# Patient Record
Sex: Male | Born: 1945
Health system: Southern US, Community
[De-identification: ages and names within clinical notes are randomized; demographics above are authoritative.]

## PROBLEM LIST (undated history)

## (undated) DIAGNOSIS — E785 Hyperlipidemia, unspecified: Secondary | ICD-10-CM

## (undated) DIAGNOSIS — C61 Malignant neoplasm of prostate: Secondary | ICD-10-CM

## (undated) DIAGNOSIS — C801 Malignant (primary) neoplasm, unspecified: Secondary | ICD-10-CM

## (undated) DIAGNOSIS — T7840XA Allergy, unspecified, initial encounter: Secondary | ICD-10-CM

## (undated) DIAGNOSIS — N200 Calculus of kidney: Secondary | ICD-10-CM

## (undated) DIAGNOSIS — I10 Essential (primary) hypertension: Secondary | ICD-10-CM

## (undated) HISTORY — DX: Hyperlipidemia, unspecified: E78.5

## (undated) HISTORY — DX: Malignant neoplasm of prostate: C61

## (undated) HISTORY — DX: Malignant (primary) neoplasm, unspecified: C80.1

## (undated) HISTORY — DX: Essential (primary) hypertension: I10

## (undated) HISTORY — DX: Calculus of kidney: N20.0

## (undated) HISTORY — DX: Allergy, unspecified, initial encounter: T78.40XA

---

## 2003-01-22 DIAGNOSIS — C801 Malignant (primary) neoplasm, unspecified: Secondary | ICD-10-CM

## 2003-01-22 HISTORY — DX: Malignant (primary) neoplasm, unspecified: C80.1

## 2003-03-16 ENCOUNTER — Ambulatory Visit (HOSPITAL_COMMUNITY): Admission: RE | Admit: 2003-03-16 | Discharge: 2003-03-16 | Payer: Self-pay | Admitting: Urology

## 2003-04-01 ENCOUNTER — Ambulatory Visit: Admission: RE | Admit: 2003-04-01 | Discharge: 2003-05-10 | Payer: Self-pay | Admitting: Radiation Oncology

## 2003-06-22 HISTORY — PX: PROSTATECTOMY: SHX69

## 2004-10-03 ENCOUNTER — Encounter (HOSPITAL_COMMUNITY): Admission: RE | Admit: 2004-10-03 | Discharge: 2004-10-18 | Payer: Self-pay | Admitting: Urology

## 2004-11-06 ENCOUNTER — Ambulatory Visit: Admission: RE | Admit: 2004-11-06 | Discharge: 2005-01-23 | Payer: Self-pay | Admitting: Radiation Oncology

## 2005-06-06 ENCOUNTER — Ambulatory Visit: Payer: Self-pay | Admitting: Internal Medicine

## 2005-06-21 ENCOUNTER — Ambulatory Visit: Payer: Self-pay | Admitting: Internal Medicine

## 2005-06-24 ENCOUNTER — Ambulatory Visit: Payer: Self-pay | Admitting: Family Medicine

## 2005-11-28 ENCOUNTER — Ambulatory Visit (HOSPITAL_COMMUNITY): Admission: RE | Admit: 2005-11-28 | Discharge: 2005-11-28 | Payer: Self-pay | Admitting: Urology

## 2006-03-10 ENCOUNTER — Ambulatory Visit (HOSPITAL_COMMUNITY): Admission: RE | Admit: 2006-03-10 | Discharge: 2006-03-10 | Payer: Self-pay | Admitting: Urology

## 2006-03-27 ENCOUNTER — Encounter (HOSPITAL_COMMUNITY): Admission: RE | Admit: 2006-03-27 | Discharge: 2006-05-22 | Payer: Self-pay | Admitting: Urology

## 2006-07-23 ENCOUNTER — Ambulatory Visit: Payer: Self-pay | Admitting: Family Medicine

## 2006-08-29 ENCOUNTER — Ambulatory Visit (HOSPITAL_COMMUNITY): Admission: RE | Admit: 2006-08-29 | Discharge: 2006-08-29 | Payer: Self-pay | Admitting: Urology

## 2007-09-11 ENCOUNTER — Ambulatory Visit: Payer: Self-pay | Admitting: Family Medicine

## 2008-03-24 ENCOUNTER — Ambulatory Visit (HOSPITAL_COMMUNITY): Admission: RE | Admit: 2008-03-24 | Discharge: 2008-03-24 | Payer: Self-pay | Admitting: Urology

## 2008-05-27 ENCOUNTER — Ambulatory Visit: Payer: Self-pay | Admitting: Family Medicine

## 2008-07-07 ENCOUNTER — Ambulatory Visit (HOSPITAL_COMMUNITY): Admission: RE | Admit: 2008-07-07 | Discharge: 2008-07-07 | Payer: Self-pay | Admitting: Urology

## 2008-11-15 ENCOUNTER — Ambulatory Visit: Payer: Self-pay | Admitting: Family Medicine

## 2009-08-28 ENCOUNTER — Ambulatory Visit (HOSPITAL_COMMUNITY): Admission: RE | Admit: 2009-08-28 | Discharge: 2009-08-28 | Payer: Self-pay | Admitting: Urology

## 2009-11-30 ENCOUNTER — Ambulatory Visit: Payer: Self-pay | Admitting: Family Medicine

## 2010-05-30 ENCOUNTER — Ambulatory Visit: Payer: Self-pay | Admitting: Medical

## 2010-11-29 ENCOUNTER — Encounter: Payer: Self-pay | Admitting: Family Medicine

## 2010-11-30 ENCOUNTER — Encounter: Payer: Self-pay | Admitting: Family Medicine

## 2010-11-30 ENCOUNTER — Ambulatory Visit (INDEPENDENT_AMBULATORY_CARE_PROVIDER_SITE_OTHER): Payer: Medicare Other | Admitting: Family Medicine

## 2010-11-30 VITALS — BP 122/82 | HR 64 | Ht 73.0 in | Wt 214.0 lb

## 2010-11-30 DIAGNOSIS — C61 Malignant neoplasm of prostate: Secondary | ICD-10-CM

## 2010-11-30 DIAGNOSIS — Z8709 Personal history of other diseases of the respiratory system: Secondary | ICD-10-CM

## 2010-11-30 DIAGNOSIS — Z23 Encounter for immunization: Secondary | ICD-10-CM

## 2010-11-30 DIAGNOSIS — J309 Allergic rhinitis, unspecified: Secondary | ICD-10-CM

## 2010-11-30 DIAGNOSIS — I10 Essential (primary) hypertension: Secondary | ICD-10-CM | POA: Insufficient documentation

## 2010-11-30 DIAGNOSIS — E785 Hyperlipidemia, unspecified: Secondary | ICD-10-CM

## 2010-11-30 DIAGNOSIS — Z79899 Other long term (current) drug therapy: Secondary | ICD-10-CM

## 2010-11-30 LAB — COMPREHENSIVE METABOLIC PANEL
ALT: 30 U/L (ref 0–53)
AST: 23 U/L (ref 0–37)
CO2: 26 mEq/L (ref 19–32)
Chloride: 103 mEq/L (ref 96–112)
Sodium: 141 mEq/L (ref 135–145)
Total Bilirubin: 0.3 mg/dL (ref 0.3–1.2)
Total Protein: 7.3 g/dL (ref 6.0–8.3)

## 2010-11-30 LAB — CBC WITH DIFFERENTIAL/PLATELET
Basophils Absolute: 0 10*3/uL (ref 0.0–0.1)
Eosinophils Absolute: 0.2 10*3/uL (ref 0.0–0.7)
Lymphocytes Relative: 16 % (ref 12–46)
Lymphs Abs: 1.2 10*3/uL (ref 0.7–4.0)
Neutrophils Relative %: 72 % (ref 43–77)
Platelets: 321 10*3/uL (ref 150–400)
RBC: 4.07 MIL/uL — ABNORMAL LOW (ref 4.22–5.81)
RDW: 14.8 % (ref 11.5–15.5)
WBC: 7.6 10*3/uL (ref 4.0–10.5)

## 2010-11-30 LAB — LIPID PANEL
Cholesterol: 233 mg/dL — ABNORMAL HIGH (ref 0–200)
LDL Cholesterol: 149 mg/dL — ABNORMAL HIGH (ref 0–99)
Total CHOL/HDL Ratio: 5.8 Ratio
VLDL: 44 mg/dL — ABNORMAL HIGH (ref 0–40)

## 2010-11-30 MED ORDER — LISINOPRIL-HYDROCHLOROTHIAZIDE 20-25 MG PO TABS: 1.0000 | ORAL_TABLET | Freq: Every day | ORAL | Status: DC

## 2010-11-30 NOTE — Patient Instructions (Signed)
Continue to take good care of yourself 

## 2010-11-30 NOTE — Progress Notes (Signed)
  Subjective:    Patient ID: Marcus [name], male    DOB: August 11, 1945, 65 y.o.   MRN: [id number]  HPI He is here for an interval evaluation. He continues to be followed by his urologist for prostate cancer. He is apparently getting an injection of the none known medication to help with apparent continued elevated PSA. She is on his blood pressure medication. He does have a previous history of elevated cholesterol but is not on a medication. His allergies and asthma are not bothering him. He is now retired. His marriage is going well. He does keep himself quite busy around the house and with travel. Family history and social history is unchanged except as above   Review of Systems  Constitutional: Negative.   HENT: Negative.   Eyes: Negative.   Respiratory: Negative.   Cardiovascular: Negative.   Musculoskeletal: Negative.   Neurological: Negative.   Hematological: Negative.   Psychiatric/Behavioral: Negative.        Objective:   Physical Exam BP 122/82  Pulse 64  Ht 6\' 1"  (1.854 m)  Wt 214 lb (97.07 kg)  BMI 28.23 kg/m2  General Appearance:    Alert, cooperative, no distress, appears stated age  Head:    Normocephalic, without obvious abnormality, atraumatic  Eyes:    PERRL, conjunctiva/corneas clear, EOM's intact, fundi    benign  Ears:    Normal TM's and external ear canals  Nose:   Nares normal, mucosa normal, no drainage or sinus   tenderness  Throat:   Lips, mucosa, and tongue normal; teeth and gums normal  Neck:   Supple, no lymphadenopathy;  thyroid:  no   enlargement/tenderness/nodules; no carotid   bruit or JVD  Back:    Spine nontender, no curvature, ROM normal, no CVA     tenderness  Lungs:     Clear to auscultation bilaterally without wheezes, rales or     ronchi; respirations unlabored  Chest Wall:    No tenderness or deformity   Heart:    Regular rate and rhythm, S1 and S2 normal, no murmur, rub   or gallop  Breast Exam:    No chest wall tenderness, masses or  gynecomastia  Abdomen:     Soft, non-tender, nondistended, normoactive bowel sounds,    no masses, no hepatosplenomegaly  Genitalia:   deferred  Rectal:   deferred   Extremities:   No clubbing, cyanosis or edema  Pulses:   2+ and symmetric all extremities  Skin:   Skin color, texture, turgor normal, no rashes or lesions  Lymph nodes:   Cervical, supraclavicular, and axillary nodes normal  Neurologic:   CNII-XII intact, normal strength, sensation and gait; reflexes 2+ and symmetric throughout          Psych:   Normal mood, affect, hygiene and grooming.           Assessment & Plan:   1. Prostate cancer  CBC with Differential, Comprehensive metabolic panel, Lipid panel  2. Hypertension  CBC with Differential, Comprehensive metabolic panel, Lipid panel  3. Hyperlipidemia LDL goal < 100  Lipid panel  4. Allergic rhinitis, mild    5. History of asthma    6. Encounter for long-term (current) use of other medications  CBC with Differential, Comprehensive metabolic panel, Lipid panel   encouraged him to keep his very active lifestyle as well as with his traveling. The lisinopril was renewed.

## 2011-01-15 ENCOUNTER — Other Ambulatory Visit: Payer: Self-pay | Admitting: Family Medicine

## 2011-03-14 ENCOUNTER — Other Ambulatory Visit (HOSPITAL_COMMUNITY): Payer: Self-pay | Admitting: Urology

## 2011-03-14 DIAGNOSIS — C61 Malignant neoplasm of prostate: Secondary | ICD-10-CM

## 2011-03-19 ENCOUNTER — Ambulatory Visit (HOSPITAL_COMMUNITY): Payer: Medicare Other

## 2011-03-19 ENCOUNTER — Encounter (HOSPITAL_COMMUNITY): Payer: Medicare Other

## 2011-03-25 ENCOUNTER — Encounter (HOSPITAL_COMMUNITY): Payer: Self-pay

## 2011-03-25 ENCOUNTER — Encounter (HOSPITAL_COMMUNITY)
Admission: RE | Admit: 2011-03-25 | Discharge: 2011-03-25 | Disposition: A | Discharge status: Home / Self Care | Payer: Medicare Other | Source: Ambulatory Visit | Attending: Urology | Admitting: Urology

## 2011-03-25 DIAGNOSIS — C61 Malignant neoplasm of prostate: Secondary | ICD-10-CM | POA: Insufficient documentation

## 2011-03-25 MED ORDER — TECHNETIUM TC 99M MEDRONATE IV KIT
27.0000 | PACK | Freq: Once | INTRAVENOUS | Status: AC | PRN
  Administered 2011-03-25: 27 via INTRAVENOUS

## 2011-10-10 ENCOUNTER — Encounter: Payer: Self-pay | Admitting: Family Medicine

## 2011-10-10 ENCOUNTER — Ambulatory Visit (INDEPENDENT_AMBULATORY_CARE_PROVIDER_SITE_OTHER): Payer: Medicare Other | Admitting: Family Medicine

## 2011-10-10 VITALS — BP 120/82 | HR 72 | Wt 223.0 lb

## 2011-10-10 DIAGNOSIS — Z23 Encounter for immunization: Secondary | ICD-10-CM

## 2011-10-10 DIAGNOSIS — M25562 Pain in left knee: Secondary | ICD-10-CM

## 2011-10-10 DIAGNOSIS — M25569 Pain in unspecified knee: Secondary | ICD-10-CM

## 2011-10-10 MED ORDER — INFLUENZA VIRUS VACC SPLIT PF IM SUSP: 0.5000 mL | Freq: Once | INTRAMUSCULAR | Status: DC

## 2011-10-10 NOTE — Progress Notes (Signed)
  Subjective:    Patient ID: Marcus [name], male    DOB: [birth year], 66 y.o.   MRN: [id number]  HPI He has a two-week history of left posterior knee pain. No history of injury or overuse. Previous history of injury. He states the pain now goes proximal and he can get in certain positions that relieve the discomfort. Motion of the knee does not cause trouble. Advil does help   Review of Systems    Objective:   Physical Exam Full motion of the hip no pain. No N. changes are noted. No anal palpation of the quad mechanism or hamstrings. Knee exam shows no tenderness to palpation, effusion with normal Murray's and anterior drawer.        Assessment & Plan:   1. Need for prophylactic vaccination and inoculation against influenza  influenza  inactive virus vaccine (FLUZONE/FLUARIX) injection 0.5 mL  2. Left knee pain     flu shot given with discussion of risk and benefit. Take regular doses of Advil. 4 tablets 3 times per day for the next 7-10 days. Keep track of your symptoms in regard to anything that makes it better or worse.

## 2011-10-10 NOTE — Patient Instructions (Addendum)
Take regular doses of Advil. 4 tablets 3 times per day for the next 7-10 days. Keep track of your symptoms in regard to anything that makes it better or worse.

## 2011-12-10 ENCOUNTER — Other Ambulatory Visit (HOSPITAL_COMMUNITY): Payer: Self-pay | Admitting: Urology

## 2011-12-10 DIAGNOSIS — C61 Malignant neoplasm of prostate: Secondary | ICD-10-CM

## 2011-12-13 ENCOUNTER — Encounter (HOSPITAL_COMMUNITY)
Admission: RE | Admit: 2011-12-13 | Discharge: 2011-12-13 | Disposition: A | Discharge status: Home / Self Care | Payer: Medicare Other | Source: Ambulatory Visit | Attending: Urology | Admitting: Urology

## 2011-12-13 DIAGNOSIS — R972 Elevated prostate specific antigen [PSA]: Secondary | ICD-10-CM | POA: Insufficient documentation

## 2011-12-13 DIAGNOSIS — C61 Malignant neoplasm of prostate: Secondary | ICD-10-CM | POA: Insufficient documentation

## 2011-12-13 MED ORDER — TECHNETIUM TC 99M MEDRONATE IV KIT
20.5000 | PACK | Freq: Once | INTRAVENOUS | Status: AC | PRN
  Administered 2011-12-13: 20.5 via INTRAVENOUS

## 2012-02-10 ENCOUNTER — Other Ambulatory Visit: Payer: Self-pay | Admitting: Family Medicine

## 2012-03-25 ENCOUNTER — Encounter: Payer: Self-pay | Admitting: Family Medicine

## 2012-05-25 ENCOUNTER — Ambulatory Visit (INDEPENDENT_AMBULATORY_CARE_PROVIDER_SITE_OTHER): Payer: Medicare Other | Admitting: Family Medicine

## 2012-05-25 ENCOUNTER — Encounter: Payer: Self-pay | Admitting: Family Medicine

## 2012-05-25 VITALS — BP 128/88 | HR 64 | Ht 73.0 in | Wt 221.0 lb

## 2012-05-25 DIAGNOSIS — C61 Malignant neoplasm of prostate: Secondary | ICD-10-CM

## 2012-05-25 DIAGNOSIS — Z8709 Personal history of other diseases of the respiratory system: Secondary | ICD-10-CM

## 2012-05-25 DIAGNOSIS — Z0000 Encounter for general adult medical examination without abnormal findings: Secondary | ICD-10-CM

## 2012-05-25 DIAGNOSIS — E785 Hyperlipidemia, unspecified: Secondary | ICD-10-CM

## 2012-05-25 DIAGNOSIS — J309 Allergic rhinitis, unspecified: Secondary | ICD-10-CM

## 2012-05-25 DIAGNOSIS — K219 Gastro-esophageal reflux disease without esophagitis: Secondary | ICD-10-CM

## 2012-05-25 DIAGNOSIS — I10 Essential (primary) hypertension: Secondary | ICD-10-CM

## 2012-05-25 LAB — COMPREHENSIVE METABOLIC PANEL
ALT: 37 U/L (ref 0–53)
AST: 31 U/L (ref 0–37)
CO2: 27 mEq/L (ref 19–32)
Calcium: 11 mg/dL — ABNORMAL HIGH (ref 8.4–10.5)
Chloride: 104 mEq/L (ref 96–112)
Creat: 1.37 mg/dL — ABNORMAL HIGH (ref 0.50–1.35)
Sodium: 139 mEq/L (ref 135–145)
Total Protein: 7.3 g/dL (ref 6.0–8.3)

## 2012-05-25 LAB — CBC WITH DIFFERENTIAL/PLATELET
Basophils Absolute: 0 10*3/uL (ref 0.0–0.1)
Eosinophils Relative: 3 % (ref 0–5)
Lymphocytes Relative: 20 % (ref 12–46)
Lymphs Abs: 1.6 10*3/uL (ref 0.7–4.0)
Neutro Abs: 5.8 10*3/uL (ref 1.7–7.7)
Neutrophils Relative %: 72 % (ref 43–77)
Platelets: 292 10*3/uL (ref 150–400)
RBC: 4.39 MIL/uL (ref 4.22–5.81)
RDW: 15.4 % (ref 11.5–15.5)
WBC: 8.1 10*3/uL (ref 4.0–10.5)

## 2012-05-25 LAB — LIPID PANEL
Cholesterol: 254 mg/dL — ABNORMAL HIGH (ref 0–200)
Total CHOL/HDL Ratio: 5.8 Ratio
VLDL: 39 mg/dL (ref 0–40)

## 2012-05-25 LAB — POCT URINALYSIS DIPSTICK
Glucose, UA: NEGATIVE
Leukocytes, UA: NEGATIVE
Nitrite, UA: NEGATIVE
Spec Grav, UA: 1.015
Urobilinogen, UA: NEGATIVE

## 2012-05-25 MED ORDER — LISINOPRIL-HYDROCHLOROTHIAZIDE 20-25 MG PO TABS: ORAL_TABLET | ORAL | Status: DC

## 2012-05-25 NOTE — Progress Notes (Signed)
  Subjective:    Patient ID: Marcus [name], male    DOB: 24-Oct-1945, 67 y.o.   MRN: [id number]  HPI He is here for complete examination. He does have a history of prostate cancer and is being followed by urology regularly. He presently is on medication for this. He has had previous prostatectomy.he does have a history of seasonal allergies as well as asthma however he has not had any asthma operation several years. He continues on his blood pressure medication and is having no difficulty with that. He does have difficulty with reflux disease and finds the Zantac to be quite useful. Family and social history were reviewed. Life in general is going quite well. He is retired and his home life is quite good   Review of Systems  Constitutional: Negative.   HENT: Negative.   Eyes: Negative.   Respiratory: Negative.   Cardiovascular: Negative.   Gastrointestinal: Negative.   Endocrine: Negative.   Genitourinary: Negative.   Musculoskeletal: Negative.   Skin: Negative.        Objective:   Physical Exam BP 128/88  Pulse 64  Ht 6\' 1"  (1.854 m)  Wt 221 lb (100.245 kg)  BMI 29.16 kg/m2  General Appearance:    Alert, cooperative, no distress, appears stated age  Head:    Normocephalic, without obvious abnormality, atraumatic  Eyes:    PERRL, conjunctiva/corneas clear, EOM's intact, fundi    benign  Ears:    Normal TM's and external ear canals  Nose:   Nares normal, mucosa normal, no drainage or sinus   tenderness  Throat:   Lips, mucosa, and tongue normal; teeth and gums normal  Neck:   Supple, no lymphadenopathy;  thyroid:  no   enlargement/tenderness/nodules; no carotid   bruit or JVD  Back:    Spine nontender, no curvature, ROM normal, no CVA     tenderness  Lungs:     Clear to auscultation bilaterally without wheezes, rales or     ronchi; respirations unlabored  Chest Wall:    No tenderness or deformity   Heart:    Regular rate and rhythm, S1 and S2 normal, no murmur, rub   or gallop   Breast Exam:    No chest wall tenderness, masses or gynecomastia  Abdomen:     Soft, non-tender, nondistended, normoactive bowel sounds,    no masses, no hepatosplenomegaly  Genitalia:  deferred  Rectal:  deferred  Extremities:   No clubbing, cyanosis or edema  Pulses:   2+ and symmetric all extremities  Skin:   Skin color, texture, turgor normal, no rashes or lesions  Lymph nodes:   Cervical, supraclavicular, and axillary nodes normal  Neurologic:   CNII-XII intact, normal strength, sensation and gait; reflexes 2+ and symmetric throughout          Psych:   Normal mood, affect, hygiene and grooming.          Assessment & Plan:  Routine general medical examination at a health care facility - Plan: Urinalysis Dipstick, CBC with Differential, Comprehensive metabolic panel, Lipid panel  Prostate cancer  Hypertension - Plan: lisinopril-hydrochlorothiazide (PRINZIDE,ZESTORETIC) 20-25 MG per tablet  Hyperlipidemia LDL goal < 100  Allergic rhinitis, mild  History of asthma  GERD (gastroesophageal reflux disease) continue on Zantac. Continues to take good care of himself. Prinzide was renewed.

## 2012-08-24 ENCOUNTER — Telehealth: Payer: Self-pay | Admitting: Oncology

## 2012-08-24 NOTE — Telephone Encounter (Signed)
S/W PT IN RE TO NP APPT 08/15 @ 10:30 W/DR. SHADAD REFERRING DR. Brunilda Payor DX- PROSTATE CA WELCOME PACKET MAILED.

## 2012-08-25 ENCOUNTER — Telehealth: Payer: Self-pay | Admitting: Oncology

## 2012-08-25 NOTE — Telephone Encounter (Signed)
C/D 08/25/12 for appt. 09/04/12

## 2012-08-28 ENCOUNTER — Other Ambulatory Visit: Payer: Self-pay | Admitting: Oncology

## 2012-08-28 DIAGNOSIS — C61 Malignant neoplasm of prostate: Secondary | ICD-10-CM

## 2012-09-03 ENCOUNTER — Other Ambulatory Visit (HOSPITAL_COMMUNITY): Payer: Self-pay | Admitting: Urology

## 2012-09-03 DIAGNOSIS — C61 Malignant neoplasm of prostate: Secondary | ICD-10-CM

## 2012-09-04 ENCOUNTER — Ambulatory Visit: Payer: Medicare Other

## 2012-09-04 ENCOUNTER — Ambulatory Visit (HOSPITAL_BASED_OUTPATIENT_CLINIC_OR_DEPARTMENT_OTHER): Payer: Medicare Other | Admitting: Oncology

## 2012-09-04 ENCOUNTER — Encounter: Payer: Self-pay | Admitting: Oncology

## 2012-09-04 ENCOUNTER — Telehealth: Payer: Self-pay | Admitting: Oncology

## 2012-09-04 ENCOUNTER — Other Ambulatory Visit (HOSPITAL_BASED_OUTPATIENT_CLINIC_OR_DEPARTMENT_OTHER): Payer: Medicare Other | Admitting: Lab

## 2012-09-04 VITALS — BP 143/96 | HR 71 | Temp 98.2°F | Resp 20 | Ht 73.0 in | Wt 222.5 lb

## 2012-09-04 DIAGNOSIS — C7951 Secondary malignant neoplasm of bone: Secondary | ICD-10-CM

## 2012-09-04 DIAGNOSIS — C61 Malignant neoplasm of prostate: Secondary | ICD-10-CM

## 2012-09-04 DIAGNOSIS — E785 Hyperlipidemia, unspecified: Secondary | ICD-10-CM

## 2012-09-04 DIAGNOSIS — I10 Essential (primary) hypertension: Secondary | ICD-10-CM

## 2012-09-04 LAB — COMPREHENSIVE METABOLIC PANEL (CC13)
AST: 18 U/L (ref 5–34)
Albumin: 3.4 g/dL — ABNORMAL LOW (ref 3.5–5.0)
Alkaline Phosphatase: 94 U/L (ref 40–150)
Chloride: 110 mEq/L — ABNORMAL HIGH (ref 98–109)
Glucose: 107 mg/dl (ref 70–140)
Potassium: 3.8 mEq/L (ref 3.5–5.1)
Sodium: 144 mEq/L (ref 136–145)
Total Protein: 7.3 g/dL (ref 6.4–8.3)

## 2012-09-04 LAB — CBC WITH DIFFERENTIAL/PLATELET
BASO%: 0.3 % (ref 0.0–2.0)
EOS%: 2.9 % (ref 0.0–7.0)
HCT: 34.7 % — ABNORMAL LOW (ref 38.4–49.9)
LYMPH%: 16.9 % (ref 14.0–49.0)
MCH: 28.7 pg (ref 27.2–33.4)
MCHC: 32.6 g/dL (ref 32.0–36.0)
MONO%: 5.1 % (ref 0.0–14.0)
NEUT%: 74.8 % (ref 39.0–75.0)
Platelets: 257 10*3/uL (ref 140–400)

## 2012-09-04 MED ORDER — ABIRATERONE ACETATE 250 MG PO TABS: 1000.0000 mg | ORAL_TABLET | Freq: Every day | ORAL | Status: DC

## 2012-09-04 NOTE — Progress Notes (Signed)
Faxed zytiga prescription to [facility] °

## 2012-09-04 NOTE — Telephone Encounter (Signed)
Called pt and left message for lab and MD for September 2014

## 2012-09-04 NOTE — Progress Notes (Signed)
Checked in new patient with no financial issues. He has no living will/POA. He has no email, so mail and phone only.

## 2012-09-04 NOTE — Progress Notes (Signed)
Reason for Referral: Prostate cancer.   HPI: Mr. Mijangos is a pleasant 67 year old gentleman referred to me for the evaluation of prostate cancer. He is a gentleman with past medical history significant for hypertension and hyperlipidemia diagnosed with prostate cancer in 2005 at that time he had presented with symptoms of difficulty urination. He underwent a prostatectomy was done the robotic late at [facility]. His PSA and exactly score are not available to me at this time. Patient did very well till 2007 when his PSA started to rise and was treated with salvage radiation therapy. His disease was under reasonable control pill February 2013 where his PSA was up to 22.51. At that time he was started with androgen deprivation with Lupron and after a short response he had a rise in his PSA was up to 92.78 in November of 2013. Staging bone scan showed multi-foci of abnormal osseous uptake consistent with metastatic bony disease including a T7 vertebral body, proximal diaphysis of the left femur and the superior wall of the right acetabulum. At that time Casodex was added and he had an excellent PSA response with combined androgen deprivation with a PSA nadir to 12 on February of 2014. Most recently his PSA went up to 25 in July of 2014 indicating a doubling time of less than 6 months. He is scheduled to have a repeat bone scan in the next 2 weeks. He was referred to me for evaluation for possible castration resistant prostate cancer.  Clinically, he is asymptomatic at this time. He is not reporting any bone pain or deterioration in his health. He is not reporting any weakness fatigue or tiredness. He is not reporting any weight loss or decline in his ability to perform activities of daily living. He is not reporting any chest pain or shortness of breath. Is not reporting any difficulty breathing or hemoptysis. Isn't reporting any nausea or vomiting or abdominal pain. He is not  reporting any change in his bowel habits. His urination has been normal without any hematuria or hesitancy or frequency.   Past Medical History  Diagnosis Date  . Asthma   . Allergy     RHINITIS  . Cancer 2005    PROSTATE  . Hypertension   . Calcium oxalate renal stones   . Dyslipidemia   :  Past Surgical History  Procedure Laterality Date  . Prostatectomy  06/2003  :  Current Outpatient Prescriptions  Medication Sig Dispense Refill  . cholecalciferol (VITAMIN D) 1000 UNITS tablet Take 1,000 Units by mouth daily.      Marland Kitchen lisinopril-hydrochlorothiazide (PRINZIDE,ZESTORETIC) 20-25 MG per tablet TAKE ONE TABLET BY MOUTH EVERY DAY  90 tablet  3  . abiraterone Acetate (ZYTIGA) 250 MG tablet Take 4 tablets (1,000 mg total) by mouth daily. Take on an empty stomach 1 hour before or 2 hours after a meal  120 tablet  0   No current facility-administered medications for this visit.     No Known Allergies:  Family history: His father had prostate cancer.  History   Social History  . Marital Status: Married    Spouse Name: N/A    Number of Children: N/A  . Years of Education: N/A   Occupational History  . Not on file.   Social History Main Topics  . Smoking status: Never Smoker   . Smokeless tobacco: Not on file  . Alcohol Use: No  . Drug Use: No  . Sexual Activity: Not Currently   Other  Topics Concern  . Not on file   Social History Narrative  . No narrative on file  :  A comprehensive review of systems was negative.  Exam:ECOG 0 Blood pressure 143/96, pulse 71, temperature 98.2 F (36.8 C), temperature source Oral, resp. rate 20, height 6\' 1"  (1.854 m), weight 222 lb 8 oz (100.925 kg). General appearance: alert, cooperative and appears stated age Head: Normocephalic, without obvious abnormality, atraumatic Throat: lips, mucosa, and tongue normal; teeth and gums normal Neck: no adenopathy, no carotid bruit, no JVD, supple, symmetrical, trachea midline and thyroid  not enlarged, symmetric, no tenderness/mass/nodules Back: negative Resp: clear to auscultation bilaterally Chest wall: no tenderness Cardio: regular rate and rhythm, S1, S2 normal, no murmur, click, rub or gallop GI: soft, non-tender; bowel sounds normal; no masses,  no organomegaly Extremities: extremities normal, atraumatic, no cyanosis or edema Pulses: 2+ and symmetric Skin: Skin color, texture, turgor normal. No rashes or lesions Lymph nodes: Cervical, supraclavicular, and axillary nodes normal.   Recent Labs  09/04/12 1049  WBC 7.5  HGB 11.3*  HCT 34.7*  PLT 257      Assessment and Plan:   67 year old gentleman with the following issues:  1. 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 His initial diagnosis dating back to 2005 where he had a prostatectomy and received salvage radiation therapy in 2007. He had disease relapse in 2013 and was treated with combined androgen deprivation for a documented bony metastasis and November of that year. After an excellent response with PSA dropping down from 92 to 12 now it is  up to 25. He is scheduled to have a repeat bone scan in the next 2 weeks. The natural course of prostate cancer and more specifically castration resistant disease with metastatic disease to the bone was discussed today in detail. He understands that the any treatment modality would be palliative not curative at this point. Options of treatments were discussed include second line hormonal manipulation with ketoconazole possibly with Zytiga or Xtandi. Immunotherapy with Provenge was also discussed as well as the role for systemic chemotherapy down the line. I've also discussed with him the possibility of clinical trial involvement with combination of Zytiga and Xtandi. After discussing the risks and benefits of all these treatments modalities we have elected to proceed with Zytiga off protocol. Risks and  benefits of this particular drug was discussed today in details. Complications include weakness fatigue and tiredness, and GI toxicity, lower extremity edema, adrenal insufficiency, electrolytes and balance and hypokalemia, and liver function abnormalities. He understands all these complications and written information was given to the patient today. I anticipate him starting on it in the near future without prednisone. I've also instructed him to stop Casodex at this time.  2. Androgen depravation: I support him continue on Lupron for the time being and he is getting that under the care of Dr. Brunilda Payor.   3. Bone health: He is currently receiving Xgeva under the care of Dr. Brunilda Payor without any complications.  4. Followup: He'll followup in about one month after he has been on Zytiga to assess for complications and toxicities.

## 2012-09-05 LAB — PSA: PSA: 32.44 ng/mL — ABNORMAL HIGH

## 2012-09-05 LAB — TESTOSTERONE: Testosterone: 36 ng/dL — ABNORMAL LOW (ref 300–890)

## 2012-09-07 ENCOUNTER — Encounter: Payer: Self-pay | Admitting: Oncology

## 2012-09-07 NOTE — Progress Notes (Signed)
[facility], [id number], approved zytiga from 09/07/12-09/07/13.

## 2012-09-10 ENCOUNTER — Encounter: Payer: Self-pay | Admitting: *Deleted

## 2012-09-10 NOTE — Progress Notes (Signed)
RECEIVED A FAX FROM [facility] CONCERNING A CONFIRMATION OF PRESCRIPTION SHIPMENT FOR ZYTIGA ON 09/09/12.

## 2012-09-18 ENCOUNTER — Encounter (HOSPITAL_COMMUNITY)
Admission: RE | Admit: 2012-09-18 | Discharge: 2012-09-18 | Disposition: A | Discharge status: Home / Self Care | Payer: Medicare Other | Source: Ambulatory Visit | Attending: Urology | Admitting: Urology

## 2012-09-18 DIAGNOSIS — C61 Malignant neoplasm of prostate: Secondary | ICD-10-CM | POA: Insufficient documentation

## 2012-09-18 MED ORDER — TECHNETIUM TC 99M MEDRONATE IV KIT
25.1000 | PACK | Freq: Once | INTRAVENOUS | Status: AC | PRN
  Administered 2012-09-18: 25.1 via INTRAVENOUS

## 2012-09-28 ENCOUNTER — Telehealth: Payer: Self-pay | Admitting: Oncology

## 2012-09-28 NOTE — Telephone Encounter (Signed)
pt called and was not aware of appts...i advised on all appts d/t for SEpt

## 2012-10-02 ENCOUNTER — Other Ambulatory Visit: Payer: Self-pay | Admitting: *Deleted

## 2012-10-02 MED ORDER — ABIRATERONE ACETATE 250 MG PO TABS: 1000.0000 mg | ORAL_TABLET | Freq: Every day | ORAL | Status: DC

## 2012-10-02 NOTE — Telephone Encounter (Signed)
THIS REFILL REQUEST FOR ZYTIGA WAS PLACED IN DR.SHADAD'S ACTIVE WORK FOLDER. 

## 2012-10-02 NOTE — Telephone Encounter (Signed)
Script for zytiga faxed to [facility] [phone]

## 2012-10-08 NOTE — Telephone Encounter (Signed)
RECEIVED A FAX FROM [facility] CONCERNING A CONFIRMATION OF PRESCRIPTION SHIPMENT FOR ZYTIGA ON 10/07/12.

## 2012-10-16 ENCOUNTER — Other Ambulatory Visit (HOSPITAL_BASED_OUTPATIENT_CLINIC_OR_DEPARTMENT_OTHER): Payer: Medicare Other | Admitting: Lab

## 2012-10-16 DIAGNOSIS — C61 Malignant neoplasm of prostate: Secondary | ICD-10-CM

## 2012-10-16 DIAGNOSIS — C7951 Secondary malignant neoplasm of bone: Secondary | ICD-10-CM

## 2012-10-16 LAB — CBC WITH DIFFERENTIAL/PLATELET
BASO%: 0.6 % (ref 0.0–2.0)
LYMPH%: 16.9 % (ref 14.0–49.0)
MCHC: 33.6 g/dL (ref 32.0–36.0)
MONO#: 0.7 10*3/uL (ref 0.1–0.9)
Platelets: 267 10*3/uL (ref 140–400)
RBC: 4.08 10*6/uL — ABNORMAL LOW (ref 4.20–5.82)
RDW: 14.7 % — ABNORMAL HIGH (ref 11.0–14.6)
WBC: 8.6 10*3/uL (ref 4.0–10.3)

## 2012-10-16 LAB — COMPREHENSIVE METABOLIC PANEL (CC13)
ALT: 25 U/L (ref 0–55)
Alkaline Phosphatase: 98 U/L (ref 40–150)
CO2: 27 mEq/L (ref 22–29)
Potassium: 4.1 mEq/L (ref 3.5–5.1)
Sodium: 142 mEq/L (ref 136–145)
Total Bilirubin: 0.4 mg/dL (ref 0.20–1.20)
Total Protein: 7.1 g/dL (ref 6.4–8.3)

## 2012-10-20 ENCOUNTER — Ambulatory Visit (HOSPITAL_BASED_OUTPATIENT_CLINIC_OR_DEPARTMENT_OTHER): Payer: Medicare Other | Admitting: Oncology

## 2012-10-20 ENCOUNTER — Telehealth: Payer: Self-pay | Admitting: Oncology

## 2012-10-20 VITALS — BP 139/85 | HR 73 | Temp 98.8°F | Resp 18 | Ht 73.0 in | Wt 223.1 lb

## 2012-10-20 DIAGNOSIS — C7951 Secondary malignant neoplasm of bone: Secondary | ICD-10-CM

## 2012-10-20 DIAGNOSIS — E291 Testicular hypofunction: Secondary | ICD-10-CM

## 2012-10-20 DIAGNOSIS — C61 Malignant neoplasm of prostate: Secondary | ICD-10-CM

## 2012-10-20 NOTE — Progress Notes (Signed)
Hematology and Oncology Follow Up Visit  Marcus [name] [id number] 28-Aug-1945 66 y.o. 10/20/2012 10:14 AM   Principle Diagnosis: 67 year old gentleman diagnosed with prostate cancer in 2005. 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   Prior Therapy: He is status post prostatectomy initially. She subsequently was treated with salvage radiation therapy in 2007. In February of 2013 his PSA was up to 22.5 and was started on androgen deprivation. Most recently developed castration resistant disease with documented bony metastasis despite combined androgen depravation with Lupron and Casodex. His last bone scan done on 09/18/2012 which showed progression of disease.  Current therapy: Zytiga a total of 1000 mg started in August of 2014 for the PSA of 32.44. He is continued on Lupron and Xgeva continued at [facility] urology.  Interim History:  Marcus [name] presents today for a followup visit. He is a gentleman with castration resistant prostate cancer with metastatic disease to the bone. He started on Zytiga in the end of August of this year and have tolerated it very well. He has not reported any complications at this time. He did not report any nausea or vomiting or abdominal pain. Did not report any urinary symptoms of hematuria dysuria or incontinence. He did not report any musculoskeletal pain arthralgias or myalgias. Did not report any lower extremity swelling. He is continued to perform activities of daily living without any hindrance or decline.  Medications: I have reviewed the patient's current medications.  Current Outpatient Prescriptions  Medication Sig Dispense Refill  . abiraterone Acetate (ZYTIGA) 250 MG tablet Take 4 tablets (1,000 mg total) by mouth daily. Take on an empty stomach 1 hour before or 2 hours after a meal  120 tablet  0  . cholecalciferol (VITAMIN D) 1000 UNITS tablet Take 1,000 Units by mouth daily.      Marland Kitchen  lisinopril-hydrochlorothiazide (PRINZIDE,ZESTORETIC) 20-25 MG per tablet TAKE ONE TABLET BY MOUTH EVERY DAY  90 tablet  3   No current facility-administered medications for this visit.     Allergies: No Known Allergies  Past Medical History, Surgical history, Social history, and Family History were reviewed and updated.  Review of Systems:  Remaining ROS negative. Physical Exam: Blood pressure 139/85, pulse 73, temperature 98.8 F (37.1 C), temperature source Oral, resp. rate 18, height 6\' 1"  (1.854 m), weight 223 lb 1.6 oz (101.197 kg). ECOG: 0 General appearance: alert, cooperative and appears stated age Head: Normocephalic, without obvious abnormality, atraumatic Neck: no adenopathy, no carotid bruit, no JVD, supple, symmetrical, trachea midline and thyroid not enlarged, symmetric, no tenderness/mass/nodules Lymph nodes: Cervical, supraclavicular, and axillary nodes normal. Heart:regular rate and rhythm, S1, S2 normal, no murmur, click, rub or gallop Lung:chest clear, no wheezing, rales, normal symmetric air entry Abdomin: soft, non-tender, without masses or organomegaly EXT:no erythema, induration, or nodules   Lab Results: Lab Results  Component Value Date   WBC 8.6 10/16/2012   HGB 12.0* 10/16/2012   HCT 35.8* 10/16/2012   MCV 87.8 10/16/2012   PLT 267 10/16/2012     Chemistry      Component Value Date/Time   NA 142 10/16/2012 0902   NA 139 05/25/2012 1522   K 4.1 10/16/2012 0902   K 4.2 05/25/2012 1522   CL 104 05/25/2012 1522   CO2 27 10/16/2012 0902   CO2 27 05/25/2012 1522   BUN 14.8 10/16/2012 0902   BUN 17 05/25/2012 1522   CREATININE 1.3 10/16/2012 0902   CREATININE 1.37* 05/25/2012 1522  Component Value Date/Time   CALCIUM 10.8* 10/16/2012 0902   CALCIUM 11.0* 05/25/2012 1522   ALKPHOS 98 10/16/2012 0902   ALKPHOS 78 05/25/2012 1522   AST 22 10/16/2012 0902   AST 31 05/25/2012 1522   ALT 25 10/16/2012 0902   ALT 37 05/25/2012 1522   BILITOT 0.40 10/16/2012 0902   BILITOT  0.4 05/25/2012 1522     Results for Marcus [name], Marcus [name] (MRN [id number]) as of 10/20/2012 09:30  Ref. Range 09/04/2012 10:49 10/16/2012 09:01  PSA Latest Range: <=4.00 ng/mL 32.44 (H) 11.78 (H)    Radiological Studies: NUCLEAR MEDICINE WHOLE BODY BONE SCINTIGRAPHY  Technique: Whole body anterior and posterior images were obtained  approximately 3 hours after intravenous injection of  radiopharmaceutical.  Radiopharmaceutical: 25.1MILLI CURIE TC-MDP TECHNETIUM TC 7M  MEDRONATE IV KIT  Comparison: 12/13/2011.  Findings: The right-sided T7 lesion and right proximal femur lesion  are not as apparent on today's scan and may have been radiated.  The right acetabular lesion, lower sternal lesion and sacral  lesions appear relatively stable.  There are new calvarial lesions and rib lesions and a new lesion in  the right pubic bone.  IMPRESSION:  1. Some lesions are stable and some are less apparent.  2. New metastatic lesions involving the ribs, calvarium and the  pelvis.    Impression and Plan:  67 year old gentleman with the following issues:  1. Castration resistant prostate cancer with metastatic disease to the bone. He was initially diagnosed in 2005 and was treated with prostatectomy and salvage radiation 2 years later. He developed recurrent disease and treated with combined androgen deprivation and as mentioned he developed castration resistant disease. His bone scan from 09/18/2012 was reviewed today and showed a new metastatic lesions as mentioned. He was started on Zytiga towards the end of August and have tolerated it very well. His PSA dropped down from 32-11 with an excellent tolerance at this time. The plan is to continue his current medication without any dose reduction or delay.  2. Androgen depravation: I recommended that he continues this under the care of Dr. Brunilda Payor.   3. Bone directed therapy: He is on next fever given monthly also under Dr. Madilyn Hook care.  4. Followup: Will be  in 4-5 weeks.  [facility], MD 9/30/201410:14 AM

## 2012-10-20 NOTE — Telephone Encounter (Signed)
gv and printed appt sched and avs for pt for NOV °

## 2012-10-22 ENCOUNTER — Encounter: Payer: Self-pay | Admitting: Internal Medicine

## 2012-11-02 ENCOUNTER — Other Ambulatory Visit: Payer: Self-pay | Admitting: *Deleted

## 2012-11-02 NOTE — Telephone Encounter (Signed)
THIS REFILL REQUEST FOR ZYTIGA WAS PLACED IN DR.SHADAD'S ACTIVE WORK FOLDER. 

## 2012-11-03 ENCOUNTER — Other Ambulatory Visit: Payer: Self-pay | Admitting: *Deleted

## 2012-11-03 MED ORDER — ABIRATERONE ACETATE 250 MG PO TABS: 1000.0000 mg | ORAL_TABLET | Freq: Every day | ORAL | Status: DC

## 2012-11-06 NOTE — Telephone Encounter (Signed)
RECEIVED A FAX FROM [facility] CONCERNING A CONFIRMATION OF PRESCRIPTION SHIPMENT FOR ZYTIGA ON 11/05/12. 

## 2012-11-23 ENCOUNTER — Other Ambulatory Visit (HOSPITAL_BASED_OUTPATIENT_CLINIC_OR_DEPARTMENT_OTHER): Payer: Medicare Other

## 2012-11-23 ENCOUNTER — Encounter (INDEPENDENT_AMBULATORY_CARE_PROVIDER_SITE_OTHER): Payer: Self-pay

## 2012-11-23 DIAGNOSIS — C7951 Secondary malignant neoplasm of bone: Secondary | ICD-10-CM

## 2012-11-23 DIAGNOSIS — C61 Malignant neoplasm of prostate: Secondary | ICD-10-CM

## 2012-11-23 LAB — CBC WITH DIFFERENTIAL/PLATELET
Basophils Absolute: 0.1 10*3/uL (ref 0.0–0.1)
Eosinophils Absolute: 0.4 10*3/uL (ref 0.0–0.5)
HGB: 12.2 g/dL — ABNORMAL LOW (ref 13.0–17.1)
MCHC: 32.9 g/dL (ref 32.0–36.0)
MCV: 88.2 fL (ref 79.3–98.0)
MONO#: 0.6 10*3/uL (ref 0.1–0.9)
MONO%: 8.4 % (ref 0.0–14.0)
NEUT#: 4.8 10*3/uL (ref 1.5–6.5)
RBC: 4.19 10*6/uL — ABNORMAL LOW (ref 4.20–5.82)
RDW: 14.5 % (ref 11.0–14.6)
WBC: 7.2 10*3/uL (ref 4.0–10.3)
lymph#: 1.3 10*3/uL (ref 0.9–3.3)

## 2012-11-23 LAB — COMPREHENSIVE METABOLIC PANEL (CC13)
ALT: 31 U/L (ref 0–55)
Albumin: 3.4 g/dL — ABNORMAL LOW (ref 3.5–5.0)
Alkaline Phosphatase: 96 U/L (ref 40–150)
BUN: 13.9 mg/dL (ref 7.0–26.0)
Calcium: 10 mg/dL (ref 8.4–10.4)
Chloride: 108 mEq/L (ref 98–109)
Glucose: 102 mg/dl (ref 70–140)
Potassium: 4.3 mEq/L (ref 3.5–5.1)
Sodium: 143 mEq/L (ref 136–145)
Total Protein: 7 g/dL (ref 6.4–8.3)

## 2012-11-24 LAB — PSA: PSA: 4.83 ng/mL — ABNORMAL HIGH (ref <=4.00)

## 2012-11-25 ENCOUNTER — Encounter (INDEPENDENT_AMBULATORY_CARE_PROVIDER_SITE_OTHER): Payer: Self-pay

## 2012-11-25 ENCOUNTER — Ambulatory Visit (HOSPITAL_BASED_OUTPATIENT_CLINIC_OR_DEPARTMENT_OTHER): Payer: Medicare Other | Admitting: Oncology

## 2012-11-25 ENCOUNTER — Telehealth: Payer: Self-pay | Admitting: Oncology

## 2012-11-25 VITALS — BP 145/86 | HR 83 | Temp 98.3°F | Resp 18 | Ht 73.0 in | Wt 224.3 lb

## 2012-11-25 DIAGNOSIS — C7951 Secondary malignant neoplasm of bone: Secondary | ICD-10-CM

## 2012-11-25 DIAGNOSIS — C61 Malignant neoplasm of prostate: Secondary | ICD-10-CM

## 2012-11-25 NOTE — Progress Notes (Signed)
Hematology and Oncology Follow Up Visit  Marcus [name] [id number] 05-18-1945 67 y.o. 11/25/2012 4:09 PM   Principle Diagnosis: 67 year old gentleman diagnosed with prostate cancer in 2005. 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   Prior Therapy: He is status post prostatectomy initially. She subsequently was treated with salvage radiation therapy in 2007. In February of 2013 his PSA was up to 22.5 and was started on androgen deprivation. Most recently developed castration resistant disease with documented bony metastasis despite combined androgen depravation with Lupron and Casodex. His last bone scan done on 09/18/2012 which showed progression of disease.  Current therapy: Zytiga a total of 1000 mg started in August of 2014 for the PSA of 32.44. He is continued on Lupron and Xgeva continued at [facility] urology.  Interim History:  Marcus [name] presents today for a followup visit. He is a gentleman with castration resistant prostate cancer with metastatic disease to the bone. He started on Zytiga in the end of August of this year and have tolerated it very well. He has not reported any complications at this time. He did not report any nausea or vomiting or abdominal pain. Did not report any urinary symptoms of hematuria dysuria or incontinence. He did not report any musculoskeletal pain arthralgias or myalgias. Did not report any lower extremity swelling. He is continued to perform activities of daily living without any hindrance or decline. He reports no new complications or illnesses.  Medications: I have reviewed the patient's current medications.  Current Outpatient Prescriptions  Medication Sig Dispense Refill  . abiraterone Acetate (ZYTIGA) 250 MG tablet Take 4 tablets (1,000 mg total) by mouth daily. Take on an empty stomach 1 hour before or 2 hours after a meal  120 tablet  0  . cholecalciferol (VITAMIN D) 1000 UNITS tablet Take 1,000 Units by  mouth daily.      Marland Kitchen lisinopril-hydrochlorothiazide (PRINZIDE,ZESTORETIC) 20-25 MG per tablet TAKE ONE TABLET BY MOUTH EVERY DAY  90 tablet  3   No current facility-administered medications for this visit.     Allergies: No Known Allergies  Past Medical History, Surgical history, Social history, and Family History were reviewed and updated.  Review of Systems:  Remaining ROS negative. Physical Exam: Blood pressure 145/86, pulse 83, temperature 98.3 F (36.8 C), temperature source Oral, resp. rate 18, height 6\' 1"  (1.854 m), weight 224 lb 4.8 oz (101.742 kg), SpO2 97.00%. ECOG: 0 General appearance: alert, cooperative and appears stated age Head: Normocephalic, without obvious abnormality, atraumatic Neck: no adenopathy, no carotid bruit, no JVD, supple, symmetrical, trachea midline and thyroid not enlarged, symmetric, no tenderness/mass/nodules Lymph nodes: Cervical, supraclavicular, and axillary nodes normal. Heart:regular rate and rhythm, S1, S2 normal, no murmur, click, rub or gallop Lung:chest clear, no wheezing, rales, normal symmetric air entry Abdomin: soft, non-tender, without masses or organomegaly EXT:no erythema, induration, or nodules   Lab Results: Lab Results  Component Value Date   WBC 7.2 11/23/2012   HGB 12.2* 11/23/2012   HCT 37.0* 11/23/2012   MCV 88.2 11/23/2012   PLT 250 11/23/2012     Chemistry      Component Value Date/Time   NA 143 11/23/2012 0951   NA 139 05/25/2012 1522   K 4.3 11/23/2012 0951   K 4.2 05/25/2012 1522   CL 104 05/25/2012 1522   CO2 26 11/23/2012 0951   CO2 27 05/25/2012 1522   BUN 13.9 11/23/2012 0951   BUN 17 05/25/2012 1522   CREATININE 1.2 11/23/2012  1610   CREATININE 1.37* 05/25/2012 1522      Component Value Date/Time   CALCIUM 10.0 11/23/2012 0951   CALCIUM 11.0* 05/25/2012 1522   ALKPHOS 96 11/23/2012 0951   ALKPHOS 78 05/25/2012 1522   AST 24 11/23/2012 0951   AST 31 05/25/2012 1522   ALT 31 11/23/2012 0951   ALT 37 05/25/2012 1522   BILITOT  0.39 11/23/2012 0951   BILITOT 0.4 05/25/2012 1522     Results for Marcus [name], Marcus [name] (MRN [id number]) as of 11/25/2012 15:35  Ref. Range 09/04/2012 10:49 10/16/2012 09:01 11/23/2012 09:51  PSA Latest Range: <=4.00 ng/mL 32.44 (H) 11.78 (H) 4.83 (H)       Impression and Plan:  67 year old gentleman with the following issues:  1. Castration resistant prostate cancer with metastatic disease to the bone. He has developed castration resistant disease and have been on [place] since August of 2014. His PSA have responded with a drop from 32 to 4.83. The plan is to continue with the current dose and schedule.  2. Androgen depravation: I recommended that he continues this under the care of Dr. Brunilda Payor.   3. Bone directed therapy: He is on Xgeva monthly also under Dr. Madilyn Hook care.  4. Followup: Will be in 5-6  weeks.  Eli Hose, MD 11/5/20144:09 PM

## 2012-11-25 NOTE — Addendum Note (Signed)
Addended by: Reesa Chew on: 11/25/2012 04:32 PM   Modules accepted: Medications

## 2012-11-25 NOTE — Telephone Encounter (Signed)
rs lab and appt per 11/5 POF cal and avs given to pt shh

## 2012-12-01 ENCOUNTER — Other Ambulatory Visit: Payer: Self-pay | Admitting: *Deleted

## 2012-12-01 MED ORDER — ABIRATERONE ACETATE 250 MG PO TABS: 1000.0000 mg | ORAL_TABLET | Freq: Every day | ORAL | Status: DC

## 2012-12-01 NOTE — Telephone Encounter (Signed)
THIS REFILL REQUEST FOR ZYTIGA WAS PLACED IN DR.SHADAD'S ACTIVE WORK FOLDER. 

## 2012-12-04 NOTE — Telephone Encounter (Signed)
RECEIVED A FAX FROM [facility] CONCERNING A CONFIRMATION OF PRESCRIPTION SHIPMENT FOR ZYTIGA ON 12/03/12. 

## 2012-12-18 ENCOUNTER — Ambulatory Visit: Payer: Medicare Other | Admitting: Oncology

## 2012-12-31 ENCOUNTER — Other Ambulatory Visit: Payer: Self-pay | Admitting: *Deleted

## 2012-12-31 MED ORDER — ABIRATERONE ACETATE 250 MG PO TABS: 1000.0000 mg | ORAL_TABLET | Freq: Every day | ORAL | Status: DC

## 2012-12-31 NOTE — Telephone Encounter (Signed)
THIS REFILL REQUEST FOR ZYTIGA WAS PLACED IN DR.SHADAD'S ACTIVE WORK FOLDER. 

## 2013-01-05 NOTE — Telephone Encounter (Signed)
RECEIVED A FAX FROM [facility] CONCERNING A CONFIRMATION OF PRESCRIPTION SHIPMENT FOR ZYTIGA ON 01/04/13.

## 2013-01-06 ENCOUNTER — Other Ambulatory Visit (HOSPITAL_BASED_OUTPATIENT_CLINIC_OR_DEPARTMENT_OTHER): Payer: Medicare Other

## 2013-01-06 DIAGNOSIS — C61 Malignant neoplasm of prostate: Secondary | ICD-10-CM

## 2013-01-06 LAB — CBC WITH DIFFERENTIAL/PLATELET
BASO%: 0.7 % (ref 0.0–2.0)
Basophils Absolute: 0.1 10*3/uL (ref 0.0–0.1)
EOS%: 6.6 % (ref 0.0–7.0)
HCT: 37.8 % — ABNORMAL LOW (ref 38.4–49.9)
LYMPH%: 15.3 % (ref 14.0–49.0)
MCH: 29.2 pg (ref 27.2–33.4)
MCHC: 33 g/dL (ref 32.0–36.0)
NEUT%: 67.6 % (ref 39.0–75.0)
Platelets: 273 10*3/uL (ref 140–400)
RBC: 4.28 10*6/uL (ref 4.20–5.82)
lymph#: 1.2 10*3/uL (ref 0.9–3.3)

## 2013-01-06 LAB — COMPREHENSIVE METABOLIC PANEL (CC13)
ALT: 30 U/L (ref 0–55)
AST: 30 U/L (ref 5–34)
Albumin: 3.5 g/dL (ref 3.5–5.0)
Alkaline Phosphatase: 98 U/L (ref 40–150)
Chloride: 108 mEq/L (ref 98–109)
Creatinine: 1.2 mg/dL (ref 0.7–1.3)
Sodium: 143 mEq/L (ref 136–145)
Total Bilirubin: 0.33 mg/dL (ref 0.20–1.20)
Total Protein: 7.1 g/dL (ref 6.4–8.3)

## 2013-01-06 LAB — PSA: PSA: 4.25 ng/mL — ABNORMAL HIGH (ref <=4.00)

## 2013-01-08 ENCOUNTER — Encounter: Payer: Self-pay | Admitting: Oncology

## 2013-01-08 ENCOUNTER — Telehealth: Payer: Self-pay | Admitting: Oncology

## 2013-01-08 ENCOUNTER — Ambulatory Visit (HOSPITAL_BASED_OUTPATIENT_CLINIC_OR_DEPARTMENT_OTHER): Payer: Medicare Other | Admitting: Oncology

## 2013-01-08 VITALS — BP 138/82 | HR 69 | Temp 98.5°F | Resp 18 | Ht 73.0 in | Wt 218.2 lb

## 2013-01-08 DIAGNOSIS — C7951 Secondary malignant neoplasm of bone: Secondary | ICD-10-CM

## 2013-01-08 DIAGNOSIS — E291 Testicular hypofunction: Secondary | ICD-10-CM

## 2013-01-08 DIAGNOSIS — C61 Malignant neoplasm of prostate: Secondary | ICD-10-CM

## 2013-01-08 NOTE — Telephone Encounter (Signed)
appts made per 12/19 POF AVS and CAL printed shh °

## 2013-01-08 NOTE — Progress Notes (Signed)
Hematology and Oncology Follow Up Visit  BROLIN DAMBROSIA [id number] 11-14-1945 67 y.o. 01/08/2013 9:38 AM   Principle Diagnosis: 67 year old gentleman diagnosed with prostate cancer in 2005. 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   Prior Therapy: He is status post prostatectomy initially. She subsequently was treated with salvage radiation therapy in 2007. In February of 2013 his PSA was up to 22.5 and was started on androgen deprivation. Most recently developed castration resistant disease with documented bony metastasis despite combined androgen depravation with Lupron and Casodex. His last bone scan done on 09/18/2012 which showed progression of disease.  Current therapy: Zytiga a total of 1000 mg started in August of 2014 for the PSA of 32.44. He is continued on Lupron and Xgeva continued at [facility] urology.  Interim History:  Mr. Kuhnle presents today for a followup visit. He is a gentleman with castration resistant prostate cancer with metastatic disease to the bone. He started on Zytiga in the end of August of this year and has tolerated it very well. He has not reported any complications at this time. He did not report any nausea or vomiting or abdominal pain. Did not report any urinary symptoms of hematuria dysuria or incontinence. He did not report any musculoskeletal pain arthralgias or myalgias. Did not report any lower extremity swelling. He is continued to perform activities of daily living without any hindrance or decline. He reports no new complications or illnesses.  Medications: I have reviewed the patient's current medications.  Current Outpatient Prescriptions  Medication Sig Dispense Refill  . abiraterone Acetate (ZYTIGA) 250 MG tablet Take 4 tablets (1,000 mg total) by mouth daily. Take on an empty stomach 1 hour before or 2 hours after a meal  120 tablet  0  . cholecalciferol (VITAMIN D) 1000 UNITS tablet Take 1,000 Units by  mouth daily.      Marland Kitchen lisinopril-hydrochlorothiazide (PRINZIDE,ZESTORETIC) 20-25 MG per tablet TAKE ONE TABLET BY MOUTH EVERY DAY  90 tablet  3   No current facility-administered medications for this visit.     Allergies: No Known Allergies  Past Medical History, Surgical history, Social history, and Family History were reviewed and updated.  Review of Systems:  Remaining ROS negative. Physical Exam: Blood pressure 138/82, pulse 69, temperature 98.5 F (36.9 C), temperature source Oral, resp. rate 18, height 6\' 1"  (1.854 m), weight 218 lb 3.2 oz (98.975 kg). ECOG: 0 General appearance: alert, cooperative and appears stated age Head: Normocephalic, without obvious abnormality, atraumatic Neck: no adenopathy, no carotid bruit, no JVD, supple, symmetrical, trachea midline and thyroid not enlarged, symmetric, no tenderness/mass/nodules Lymph nodes: Cervical, supraclavicular, and axillary nodes normal. Heart:regular rate and rhythm, S1, S2 normal, no murmur, click, rub or gallop Lung:chest clear, no wheezing, rales, normal symmetric air entry Abdomen: soft, non-tender, without masses or organomegaly EXT:no erythema, induration, or nodules   Lab Results: Lab Results  Component Value Date   WBC 7.8 01/06/2013   HGB 12.5* 01/06/2013   HCT 37.8* 01/06/2013   MCV 88.5 01/06/2013   PLT 273 01/06/2013     Chemistry      Component Value Date/Time   NA 143 01/06/2013 1011   NA 139 05/25/2012 1522   K 3.9 01/06/2013 1011   K 4.2 05/25/2012 1522   CL 104 05/25/2012 1522   CO2 26 01/06/2013 1011   CO2 27 05/25/2012 1522   BUN 17.2 01/06/2013 1011   BUN 17 05/25/2012 1522   CREATININE 1.2 01/06/2013 1011  CREATININE 1.37* 05/25/2012 1522      Component Value Date/Time   CALCIUM 9.8 01/06/2013 1011   CALCIUM 11.0* 05/25/2012 1522   ALKPHOS 98 01/06/2013 1011   ALKPHOS 78 05/25/2012 1522   AST 30 01/06/2013 1011   AST 31 05/25/2012 1522   ALT 30 01/06/2013 1011   ALT 37 05/25/2012 1522   BILITOT  0.33 01/06/2013 1011   BILITOT 0.4 05/25/2012 1522     Results for TREYSEAN, PETRUZZI (MRN [id number]) as of 01/08/2013 08:54  Ref. Range 09/04/2012 10:49 10/16/2012 09:01 11/23/2012 09:51 01/06/2013 10:11  PSA Latest Range: <=4.00 ng/mL 32.44 (H) 11.78 (H) 4.83 (H) 4.25 (H)     Impression and Plan:  67 year old gentleman with the following issues:  1. Castration resistant prostate cancer with metastatic disease to the bone. He has developed castration resistant disease and have been on [place] since August of 2014. His PSA have responded with a drop from 32 to 4.25. The plan is to continue with the current dose and schedule.  2. Androgen depravation: I recommended that he continues this under the care of Dr. Brunilda Payor.   3. Bone directed therapy: He is on Xgeva monthly also under Dr. Madilyn Hook care.  4. Followup: Will be in about 5  weeks.  Fillmore Bynum 12/19/20149:38 AM

## 2013-01-29 ENCOUNTER — Other Ambulatory Visit: Payer: Self-pay | Admitting: *Deleted

## 2013-01-29 NOTE — Telephone Encounter (Signed)
THIS REFILL REQUEST FOR ZYTIGA WAS PLACED IN DR.SHADAD'S ACTIVE WORK FOLDER.

## 2013-02-02 ENCOUNTER — Other Ambulatory Visit: Payer: Self-pay | Admitting: *Deleted

## 2013-02-02 MED ORDER — ABIRATERONE ACETATE 250 MG PO TABS
1000.0000 mg | ORAL_TABLET | Freq: Every day | ORAL | Status: DC
Start: 2013-02-02 — End: 2013-03-02

## 2013-02-02 NOTE — Telephone Encounter (Signed)
E-scribed  zytiga to [facility].

## 2013-02-03 NOTE — Telephone Encounter (Signed)
[facility] faxed confirmation of facsimile receipt for Zytiga prescription referral.  [facility] will verify insurance and make delivery arrangements with patient. 

## 2013-02-12 ENCOUNTER — Other Ambulatory Visit (HOSPITAL_BASED_OUTPATIENT_CLINIC_OR_DEPARTMENT_OTHER): Payer: Medicare Other

## 2013-02-12 DIAGNOSIS — C61 Malignant neoplasm of prostate: Secondary | ICD-10-CM

## 2013-02-12 LAB — COMPREHENSIVE METABOLIC PANEL (CC13)
ALBUMIN: 3.5 g/dL (ref 3.5–5.0)
ALT: 23 U/L (ref 0–55)
ANION GAP: 8 meq/L (ref 3–11)
AST: 20 U/L (ref 5–34)
Alkaline Phosphatase: 92 U/L (ref 40–150)
BUN: 13.1 mg/dL (ref 7.0–26.0)
CO2: 28 meq/L (ref 22–29)
Calcium: 10.4 mg/dL (ref 8.4–10.4)
Chloride: 106 mEq/L (ref 98–109)
Creatinine: 1.1 mg/dL (ref 0.7–1.3)
GLUCOSE: 106 mg/dL (ref 70–140)
POTASSIUM: 4 meq/L (ref 3.5–5.1)
SODIUM: 142 meq/L (ref 136–145)
TOTAL PROTEIN: 6.9 g/dL (ref 6.4–8.3)
Total Bilirubin: 0.4 mg/dL (ref 0.20–1.20)

## 2013-02-12 LAB — CBC WITH DIFFERENTIAL/PLATELET
BASO%: 0.7 % (ref 0.0–2.0)
BASOS ABS: 0.1 10*3/uL (ref 0.0–0.1)
EOS%: 5.2 % (ref 0.0–7.0)
Eosinophils Absolute: 0.4 10*3/uL (ref 0.0–0.5)
HCT: 37.3 % — ABNORMAL LOW (ref 38.4–49.9)
HEMOGLOBIN: 12.4 g/dL — AB (ref 13.0–17.1)
LYMPH%: 14.6 % (ref 14.0–49.0)
MCH: 29.3 pg (ref 27.2–33.4)
MCHC: 33.3 g/dL (ref 32.0–36.0)
MCV: 88.1 fL (ref 79.3–98.0)
MONO#: 0.7 10*3/uL (ref 0.1–0.9)
MONO%: 8.9 % (ref 0.0–14.0)
NEUT#: 5.5 10*3/uL (ref 1.5–6.5)
NEUT%: 70.6 % (ref 39.0–75.0)
Platelets: 258 10*3/uL (ref 140–400)
RBC: 4.23 10*6/uL (ref 4.20–5.82)
RDW: 14.7 % — AB (ref 11.0–14.6)
WBC: 7.7 10*3/uL (ref 4.0–10.3)
lymph#: 1.1 10*3/uL (ref 0.9–3.3)

## 2013-02-12 LAB — PSA: PSA: 3.2 ng/mL (ref <=4.00)

## 2013-02-15 ENCOUNTER — Other Ambulatory Visit: Payer: Self-pay | Admitting: *Deleted

## 2013-02-15 NOTE — Progress Notes (Signed)
Spoke with patient, his appt with dr Alen Blew has been moved from 1:15 to  4:00 on 02/16/13. Patient verbalizes understanding. pof to schedulers.

## 2013-02-16 ENCOUNTER — Telehealth: Payer: Self-pay | Admitting: Oncology

## 2013-02-16 ENCOUNTER — Telehealth: Payer: Self-pay | Admitting: *Deleted

## 2013-02-16 ENCOUNTER — Ambulatory Visit (HOSPITAL_BASED_OUTPATIENT_CLINIC_OR_DEPARTMENT_OTHER): Payer: Medicare Other | Admitting: Oncology

## 2013-02-16 ENCOUNTER — Ambulatory Visit: Payer: Medicare Other | Admitting: Oncology

## 2013-02-16 VITALS — BP 151/87 | HR 72 | Temp 98.1°F | Resp 18 | Ht 73.0 in | Wt 217.8 lb

## 2013-02-16 DIAGNOSIS — E291 Testicular hypofunction: Secondary | ICD-10-CM

## 2013-02-16 DIAGNOSIS — C7951 Secondary malignant neoplasm of bone: Secondary | ICD-10-CM

## 2013-02-16 DIAGNOSIS — C7952 Secondary malignant neoplasm of bone marrow: Secondary | ICD-10-CM

## 2013-02-16 DIAGNOSIS — C61 Malignant neoplasm of prostate: Secondary | ICD-10-CM

## 2013-02-16 NOTE — Telephone Encounter (Signed)
sw pt informed him that [facility] would like for him to come in today @ 4pm instead of 1:15pm. Pt is aware...td

## 2013-02-16 NOTE — Telephone Encounter (Signed)
gv and printed appt sched anda vs for pt for March.... °

## 2013-02-16 NOTE — Progress Notes (Signed)
Hematology and Oncology Follow Up Visit  Marcus [name] [id number] April 24, 1945 68 y.o. 02/16/2013 4:10 PM   Principle Diagnosis: 68 year old gentleman diagnosed with prostate cancer in 2005. 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   Prior Therapy: He is status post prostatectomy initially. She subsequently was treated with salvage radiation therapy in 2007. In February of 2013 his PSA was up to 22.5 and was started on androgen deprivation. Most recently developed castration resistant disease with documented bony metastasis despite combined androgen depravation with Lupron and Casodex. His last bone scan done on 09/18/2012 which showed progression of disease.  Current therapy: Zytiga a total of 1000 mg started in August of 2014 for the PSA of 32.44. He is continued on Lupron and Xgeva continued at [facility] urology.  Interim History:  Marcus [name] presents today for a followup visit. He is a gentleman with castration resistant prostate cancer with metastatic disease to the bone. He started on Zytiga in 08/2012 has tolerated it very well. He has not reported any complications at this time. He did not report any nausea or vomiting or abdominal pain. Did not report any urinary symptoms of hematuria dysuria or incontinence. He did not report any musculoskeletal pain arthralgias or myalgias. Did not report any lower extremity swelling. He is continued to perform activities of daily living without any hindrance or decline. He reports no new complications or illnesses.  Medications: I have reviewed the patient's current medications.  Current Outpatient Prescriptions  Medication Sig Dispense Refill  . abiraterone Acetate (ZYTIGA) 250 MG tablet Take 4 tablets (1,000 mg total) by mouth daily. Take on an empty stomach 1 hour before or 2 hours after a meal  120 tablet  0  . cholecalciferol (VITAMIN D) 1000 UNITS tablet Take 1,000 Units by mouth daily.      Marland Kitchen  lisinopril-hydrochlorothiazide (PRINZIDE,ZESTORETIC) 20-25 MG per tablet TAKE ONE TABLET BY MOUTH EVERY DAY  90 tablet  3   No current facility-administered medications for this visit.     Allergies: No Known Allergies  Past Medical History, Surgical history, Social history, and Family History were reviewed and updated.  Review of Systems:  Remaining ROS negative. Physical Exam: Blood pressure 151/87, pulse 72, temperature 98.1 F (36.7 C), temperature source Oral, resp. rate 18, height 6\' 1"  (1.854 m), weight 217 lb 12.8 oz (98.793 kg). ECOG: 0 General appearance: alert, cooperative and appears stated age Head: Normocephalic, without obvious abnormality, atraumatic Neck: no adenopathy, no carotid bruit, no JVD, supple, symmetrical, trachea midline and thyroid not enlarged, symmetric, no tenderness/mass/nodules Lymph nodes: Cervical, supraclavicular, and axillary nodes normal. Heart:regular rate and rhythm, S1, S2 normal, no murmur, click, rub or gallop Lung:chest clear, no wheezing, rales, normal symmetric air entry Abdomen: soft, non-tender, without masses or organomegaly EXT:no erythema, induration, or nodules   Lab Results: Lab Results  Component Value Date   WBC 7.7 02/12/2013   HGB 12.4* 02/12/2013   HCT 37.3* 02/12/2013   MCV 88.1 02/12/2013   PLT 258 02/12/2013     Chemistry      Component Value Date/Time   NA 142 02/12/2013 1012   NA 139 05/25/2012 1522   K 4.0 02/12/2013 1012   K 4.2 05/25/2012 1522   CL 104 05/25/2012 1522   CO2 28 02/12/2013 1012   CO2 27 05/25/2012 1522   BUN 13.1 02/12/2013 1012   BUN 17 05/25/2012 1522   CREATININE 1.1 02/12/2013 1012   CREATININE 1.37* 05/25/2012 1522  Component Value Date/Time   CALCIUM 10.4 02/12/2013 1012   CALCIUM 11.0* 05/25/2012 1522   ALKPHOS 92 02/12/2013 1012   ALKPHOS 78 05/25/2012 1522   AST 20 02/12/2013 1012   AST 31 05/25/2012 1522   ALT 23 02/12/2013 1012   ALT 37 05/25/2012 1522   BILITOT 0.40 02/12/2013 1012   BILITOT  0.4 05/25/2012 1522      Results for Marcus [name] (MRN [id number]) as of 02/16/2013 16:11  Ref. Range 01/06/2013 10:11 02/12/2013 10:12  PSA Latest Range: <=4.00 ng/mL 4.25 (H) 3.20     Impression and Plan:  68 year old gentleman with the following issues:  1. Castration resistant prostate cancer with metastatic disease to the bone. He has developed castration resistant disease and have been on [place] since August of 2014. His PSA have responded with a drop from 32 to 3.2. The plan is to continue with the current dose and schedule.  2. Androgen depravation: I recommended that he continues this under the care of Dr. Janice Norrie.   3. Bone directed therapy: He is on Xgeva monthly also under Dr. Sammie Bench care.  4. Followup: Will be in about 5  weeks.  KDXIPJ,ASNKN 1/27/20154:10 PM

## 2013-03-01 ENCOUNTER — Other Ambulatory Visit: Payer: Self-pay | Admitting: *Deleted

## 2013-03-01 DIAGNOSIS — C61 Malignant neoplasm of prostate: Secondary | ICD-10-CM

## 2013-03-01 NOTE — Telephone Encounter (Signed)
THIS REFILL REQUEST FOR ZYTIGA WAS PLACED IN DR.SHADAD'S ACTIVE WORK FOLDER.

## 2013-03-02 MED ORDER — ABIRATERONE ACETATE 250 MG PO TABS: 1000.0000 mg | ORAL_TABLET | Freq: Every day | ORAL | Status: DC

## 2013-03-02 NOTE — Addendum Note (Signed)
Addended by: Wyonia Hough on: 03/02/2013 01:14 PM   Modules accepted: Orders

## 2013-03-23 ENCOUNTER — Other Ambulatory Visit (HOSPITAL_BASED_OUTPATIENT_CLINIC_OR_DEPARTMENT_OTHER): Payer: Medicare Other

## 2013-03-23 ENCOUNTER — Encounter (INDEPENDENT_AMBULATORY_CARE_PROVIDER_SITE_OTHER): Payer: Self-pay

## 2013-03-23 DIAGNOSIS — C61 Malignant neoplasm of prostate: Secondary | ICD-10-CM

## 2013-03-23 DIAGNOSIS — E291 Testicular hypofunction: Secondary | ICD-10-CM

## 2013-03-23 LAB — COMPREHENSIVE METABOLIC PANEL (CC13)
ALBUMIN: 3.5 g/dL (ref 3.5–5.0)
ALT: 20 U/L (ref 0–55)
AST: 17 U/L (ref 5–34)
Alkaline Phosphatase: 94 U/L (ref 40–150)
Anion Gap: 7 mEq/L (ref 3–11)
BUN: 14.9 mg/dL (ref 7.0–26.0)
CALCIUM: 10.2 mg/dL (ref 8.4–10.4)
CHLORIDE: 107 meq/L (ref 98–109)
CO2: 27 mEq/L (ref 22–29)
CREATININE: 1.2 mg/dL (ref 0.7–1.3)
Glucose: 104 mg/dl (ref 70–140)
POTASSIUM: 3.8 meq/L (ref 3.5–5.1)
Sodium: 142 mEq/L (ref 136–145)
Total Bilirubin: 0.34 mg/dL (ref 0.20–1.20)
Total Protein: 6.9 g/dL (ref 6.4–8.3)

## 2013-03-23 LAB — CBC WITH DIFFERENTIAL/PLATELET
BASO%: 0.7 % (ref 0.0–2.0)
BASOS ABS: 0 10*3/uL (ref 0.0–0.1)
EOS ABS: 0.3 10*3/uL (ref 0.0–0.5)
EOS%: 4.5 % (ref 0.0–7.0)
HEMATOCRIT: 38.2 % — AB (ref 38.4–49.9)
HGB: 12.6 g/dL — ABNORMAL LOW (ref 13.0–17.1)
LYMPH#: 1.2 10*3/uL (ref 0.9–3.3)
LYMPH%: 17.5 % (ref 14.0–49.0)
MCH: 29 pg (ref 27.2–33.4)
MCHC: 33.1 g/dL (ref 32.0–36.0)
MCV: 87.6 fL (ref 79.3–98.0)
MONO#: 0.6 10*3/uL (ref 0.1–0.9)
MONO%: 8.4 % (ref 0.0–14.0)
NEUT%: 68.9 % (ref 39.0–75.0)
NEUTROS ABS: 4.8 10*3/uL (ref 1.5–6.5)
Platelets: 259 10*3/uL (ref 140–400)
RBC: 4.36 10*6/uL (ref 4.20–5.82)
RDW: 14.6 % (ref 11.0–14.6)
WBC: 6.9 10*3/uL (ref 4.0–10.3)

## 2013-03-23 LAB — PSA: PSA: 2.8 ng/mL (ref <=4.00)

## 2013-03-26 ENCOUNTER — Encounter: Payer: Self-pay | Admitting: Oncology

## 2013-03-26 ENCOUNTER — Ambulatory Visit (HOSPITAL_BASED_OUTPATIENT_CLINIC_OR_DEPARTMENT_OTHER): Payer: Medicare Other | Admitting: Oncology

## 2013-03-26 ENCOUNTER — Telehealth: Payer: Self-pay | Admitting: *Deleted

## 2013-03-26 VITALS — BP 158/90 | HR 63 | Temp 98.9°F | Resp 18 | Ht 73.0 in | Wt 218.5 lb

## 2013-03-26 DIAGNOSIS — C7952 Secondary malignant neoplasm of bone marrow: Secondary | ICD-10-CM

## 2013-03-26 DIAGNOSIS — C7951 Secondary malignant neoplasm of bone: Secondary | ICD-10-CM

## 2013-03-26 DIAGNOSIS — C61 Malignant neoplasm of prostate: Secondary | ICD-10-CM

## 2013-03-26 NOTE — Telephone Encounter (Signed)
appts made and printed. Pt want labs on 04/30/13. i emailed [facility] to give me a time slot to see the pt....td

## 2013-03-26 NOTE — Progress Notes (Signed)
Hematology and Oncology Follow Up Visit  Marcus [name] [id number] 1945-02-13 68 y.o. 03/26/2013 12:52 PM   Principle Diagnosis: 68 year old gentleman diagnosed with prostate cancer in 2005. 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   Prior Therapy: He is status post prostatectomy initially. She subsequently was treated with salvage radiation therapy in 2007. In February of 2013 his PSA was up to 22.5 and was started on androgen deprivation. Most recently developed castration resistant disease with documented bony metastasis despite combined androgen depravation with Lupron and Casodex. His last bone scan done on 09/18/2012 which showed progression of disease.  Current therapy: Zytiga a total of 1000 mg started in August of 2014 for the PSA of 32.44. He is continued on Lupron and Xgeva continued at [facility] urology.  Interim History:  Mr. Marcus [name] presents today for a followup visit. He is a gentleman with castration resistant prostate cancer with metastatic disease to the bone. He started on Zytiga in 08/2012 has tolerated it very well. He has not reported any complications at this time. He did not report any nausea or vomiting or abdominal pain. Did not report any urinary symptoms of hematuria dysuria or incontinence. He did not report any musculoskeletal pain arthralgias or myalgias. Did not report any lower extremity swelling. He is continued to perform activities of daily living without any hindrance or decline. He reports no new complications or illnesses.  Medications: I have reviewed the patient's current medications.  Current Outpatient Prescriptions  Medication Sig Dispense Refill  . abiraterone Acetate (ZYTIGA) 250 MG tablet Take 4 tablets (1,000 mg total) by mouth daily. Take on an empty stomach 1 hour before or 2 hours after a meal  120 tablet  0  . cholecalciferol (VITAMIN D) 1000 UNITS tablet Take 1,000 Units by mouth daily.      Marland Kitchen  lisinopril-hydrochlorothiazide (PRINZIDE,ZESTORETIC) 20-25 MG per tablet TAKE ONE TABLET BY MOUTH EVERY DAY  90 tablet  3   No current facility-administered medications for this visit.     Allergies: No Known Allergies  Past Medical History, Surgical history, Social history, and Family History were reviewed and updated.  Review of Systems:  Remaining ROS negative. Physical Exam: Blood pressure 158/90, pulse 63, temperature 98.9 F (37.2 C), temperature source Oral, resp. rate 18, height 6\' 1"  (1.854 m), weight 218 lb 8 oz (99.111 kg), SpO2 99.00%. ECOG: 0 General appearance: alert, cooperative and appears stated age Head: Normocephalic, without obvious abnormality, atraumatic Neck: no adenopathy, no carotid bruit, no JVD, supple, symmetrical, trachea midline and thyroid not enlarged, symmetric, no tenderness/mass/nodules Lymph nodes: Cervical, supraclavicular, and axillary nodes normal. Heart:regular rate and rhythm, S1, S2 normal, no murmur, click, rub or gallop Lung:chest clear, no wheezing, rales, normal symmetric air entry Abdomen: soft, non-tender, without masses or organomegaly EXT:no erythema, induration, or nodules   Lab Results: Lab Results  Component Value Date   WBC 6.9 03/23/2013   HGB 12.6* 03/23/2013   HCT 38.2* 03/23/2013   MCV 87.6 03/23/2013   PLT 259 03/23/2013     Chemistry      Component Value Date/Time   NA 142 03/23/2013 1102   NA 139 05/25/2012 1522   K 3.8 03/23/2013 1102   K 4.2 05/25/2012 1522   CL 104 05/25/2012 1522   CO2 27 03/23/2013 1102   CO2 27 05/25/2012 1522   BUN 14.9 03/23/2013 1102   BUN 17 05/25/2012 1522   CREATININE 1.2 03/23/2013 1102   CREATININE 1.37* 05/25/2012 1522  Component Value Date/Time   CALCIUM 10.2 03/23/2013 1102   CALCIUM 11.0* 05/25/2012 1522   ALKPHOS 94 03/23/2013 1102   ALKPHOS 78 05/25/2012 1522   AST 17 03/23/2013 1102   AST 31 05/25/2012 1522   ALT 20 03/23/2013 1102   ALT 37 05/25/2012 1522   BILITOT 0.34 03/23/2013 1102   BILITOT 0.4  05/25/2012 1522      Results for [name], [name] (MRN [id number]) as of 03/26/2013 12:52  Ref. Range 10/16/2012 09:01 11/23/2012 09:51 01/06/2013 10:11 02/12/2013 10:12 03/23/2013 11:02  PSA Latest Range: <=4.00 ng/mL 11.78 (H) 4.83 (H) 4.25 (H) 3.20 2.80     Impression and Plan:  68 year old gentleman with the following issues:  1. Castration resistant prostate cancer with metastatic disease to the bone. He has developed castration resistant disease and have been on [place] since August of 2014. His PSA have responded with a drop from 32 to 2.8. The plan is to continue with the current dose and schedule.  2. Androgen depravation: I recommended that he continues this under the care of Dr. Janice Norrie.   3. Bone directed therapy: He is on Xgeva monthly also under Dr. Sammie Bench care.  4. Followup: Will be in about 5  weeks.  Marcus [name] 3/6/201512:52 PM

## 2013-03-29 ENCOUNTER — Telehealth: Payer: Self-pay | Admitting: *Deleted

## 2013-03-29 NOTE — Telephone Encounter (Signed)
sw pt gv appt for 04/30/13 @ 4pm to see [facility]. Pt requested to moved his lab only to 04/23/13 @ 11:15am.the patient is aware of his appts...td

## 2013-03-30 ENCOUNTER — Other Ambulatory Visit: Payer: Self-pay | Admitting: *Deleted

## 2013-03-30 NOTE — Telephone Encounter (Signed)
THIS REFILL REQUEST FOR ZYTIGA WAS PLACED IN DR.SHADAD'S ACTIVE WORK FOLDER.

## 2013-03-31 ENCOUNTER — Other Ambulatory Visit: Payer: Self-pay | Admitting: *Deleted

## 2013-03-31 DIAGNOSIS — C61 Malignant neoplasm of prostate: Secondary | ICD-10-CM

## 2013-03-31 MED ORDER — ABIRATERONE ACETATE 250 MG PO TABS: 1000.0000 mg | ORAL_TABLET | Freq: Every day | ORAL | Status: DC

## 2013-04-05 NOTE — Telephone Encounter (Signed)
RECEIVED A FAX FROM [facility] CONCERNING A CONFIRMATION OF PRESCRIPTION SHIPMENT FOR [place] ON 04/02/13.

## 2013-04-16 ENCOUNTER — Telehealth: Payer: Self-pay | Admitting: *Deleted

## 2013-04-16 NOTE — Telephone Encounter (Signed)
Patient calling to request, that he get his Xgeva injections here, instead of at dr nesi's office. He has spoken to dr nesi's nurse. Currently  has an appointment 04/20/13 for an injection there. Will keep that appt.  Will  discuss with dr Alen Blew @ next visit 04/30/13. We will then need Dannielle Huh, to submit for prior authorization.

## 2013-04-23 ENCOUNTER — Other Ambulatory Visit (HOSPITAL_BASED_OUTPATIENT_CLINIC_OR_DEPARTMENT_OTHER): Payer: Medicare Other

## 2013-04-23 DIAGNOSIS — C7951 Secondary malignant neoplasm of bone: Secondary | ICD-10-CM

## 2013-04-23 DIAGNOSIS — C61 Malignant neoplasm of prostate: Secondary | ICD-10-CM

## 2013-04-23 DIAGNOSIS — E291 Testicular hypofunction: Secondary | ICD-10-CM

## 2013-04-23 DIAGNOSIS — C7952 Secondary malignant neoplasm of bone marrow: Secondary | ICD-10-CM

## 2013-04-23 LAB — COMPREHENSIVE METABOLIC PANEL (CC13)
ALT: 17 U/L (ref 0–55)
AST: 18 U/L (ref 5–34)
Albumin: 3.1 g/dL — ABNORMAL LOW (ref 3.5–5.0)
Alkaline Phosphatase: 80 U/L (ref 40–150)
Anion Gap: 9 mEq/L (ref 3–11)
BILIRUBIN TOTAL: 0.51 mg/dL (ref 0.20–1.20)
BUN: 12.5 mg/dL (ref 7.0–26.0)
CHLORIDE: 108 meq/L (ref 98–109)
CO2: 26 mEq/L (ref 22–29)
Calcium: 9.4 mg/dL (ref 8.4–10.4)
Creatinine: 1.1 mg/dL (ref 0.7–1.3)
Glucose: 120 mg/dl (ref 70–140)
Potassium: 3.8 mEq/L (ref 3.5–5.1)
Sodium: 143 mEq/L (ref 136–145)
TOTAL PROTEIN: 6.4 g/dL (ref 6.4–8.3)

## 2013-04-23 LAB — CBC WITH DIFFERENTIAL/PLATELET
BASO%: 0.3 % (ref 0.0–2.0)
Basophils Absolute: 0 10*3/uL (ref 0.0–0.1)
EOS%: 2.7 % (ref 0.0–7.0)
Eosinophils Absolute: 0.3 10*3/uL (ref 0.0–0.5)
HCT: 34.1 % — ABNORMAL LOW (ref 38.4–49.9)
HGB: 11.3 g/dL — ABNORMAL LOW (ref 13.0–17.1)
LYMPH#: 1.1 10*3/uL (ref 0.9–3.3)
LYMPH%: 10.3 % — AB (ref 14.0–49.0)
MCH: 28.8 pg (ref 27.2–33.4)
MCHC: 33.2 g/dL (ref 32.0–36.0)
MCV: 86.7 fL (ref 79.3–98.0)
MONO#: 0.9 10*3/uL (ref 0.1–0.9)
MONO%: 8.6 % (ref 0.0–14.0)
NEUT%: 78.1 % — ABNORMAL HIGH (ref 39.0–75.0)
NEUTROS ABS: 8.1 10*3/uL — AB (ref 1.5–6.5)
Platelets: 220 10*3/uL (ref 140–400)
RBC: 3.93 10*6/uL — AB (ref 4.20–5.82)
RDW: 14.9 % — AB (ref 11.0–14.6)
WBC: 10.3 10*3/uL (ref 4.0–10.3)

## 2013-04-24 LAB — PSA: PSA: 2.92 ng/mL (ref <=4.00)

## 2013-04-29 ENCOUNTER — Other Ambulatory Visit: Payer: Self-pay | Admitting: *Deleted

## 2013-04-29 ENCOUNTER — Other Ambulatory Visit: Payer: Self-pay | Admitting: Medical Oncology

## 2013-04-29 DIAGNOSIS — C61 Malignant neoplasm of prostate: Secondary | ICD-10-CM

## 2013-04-29 MED ORDER — ABIRATERONE ACETATE 250 MG PO TABS: 1000.0000 mg | ORAL_TABLET | Freq: Every day | ORAL | Status: DC

## 2013-04-29 NOTE — Telephone Encounter (Signed)
THIS REFILL REQUEST FOR ZYTIGA WAS PLACED IN DR.SHADAD'S ACTIVE WORK FOLDER. 

## 2013-04-29 NOTE — Telephone Encounter (Signed)
Prescription refill for Zytiga faxed to [facility]. 

## 2013-04-29 NOTE — Telephone Encounter (Signed)
RECEIVED A FAX FROM [facility] CONCERNING A CONFIRMATION OF FACSIMILE RECEIPT FOR PT. REFERRAL. 

## 2013-04-30 ENCOUNTER — Other Ambulatory Visit: Payer: Medicare Other

## 2013-04-30 ENCOUNTER — Ambulatory Visit (HOSPITAL_BASED_OUTPATIENT_CLINIC_OR_DEPARTMENT_OTHER): Payer: Medicare Other | Admitting: Oncology

## 2013-04-30 ENCOUNTER — Telehealth: Payer: Self-pay | Admitting: Oncology

## 2013-04-30 ENCOUNTER — Encounter: Payer: Self-pay | Admitting: Oncology

## 2013-04-30 VITALS — BP 160/82 | HR 74 | Temp 98.4°F | Resp 19 | Ht 73.0 in | Wt 217.1 lb

## 2013-04-30 DIAGNOSIS — C7951 Secondary malignant neoplasm of bone: Secondary | ICD-10-CM

## 2013-04-30 DIAGNOSIS — C61 Malignant neoplasm of prostate: Secondary | ICD-10-CM

## 2013-04-30 DIAGNOSIS — C7952 Secondary malignant neoplasm of bone marrow: Secondary | ICD-10-CM

## 2013-04-30 NOTE — Progress Notes (Signed)
Hematology and Oncology Follow Up Visit  Marcus [name] [id number] April 14, 1945 68 y.o. 04/30/2013 4:01 PM   Principle Diagnosis: 68 year old gentleman diagnosed with prostate cancer in 2005. 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   Prior Therapy: He is status post prostatectomy initially. She subsequently was treated with salvage radiation therapy in 2007. In February of 2013 his PSA was up to 22.5 and was started on androgen deprivation. Most recently developed castration resistant disease with documented bony metastasis despite combined androgen depravation with Lupron and Casodex. His last bone scan done on 09/18/2012 which showed progression of disease.  Current therapy: Zytiga a total of 1000 mg started in August of 2014 for the PSA of 32.44. He is continued on Lupron and Xgeva continued at [facility] urology.  Interim History:  Marcus [name] presents today for a followup visit. He is a gentleman with castration resistant prostate cancer with metastatic disease to the bone. He started on Zytiga in 08/2012 has tolerated it very well. He has not reported any complications at this time. He did not report any nausea or vomiting or abdominal pain. Did not report any urinary symptoms of hematuria dysuria or incontinence. He did not report any musculoskeletal pain arthralgias or myalgias. Did not report any lower extremity swelling. He is continued to perform activities of daily living without any hindrance or decline. He has not reported any neurological symptoms including headaches blurred vision or seizures. He has not reported any chest pain or difficulty breathing. Has not reported any cough or hemoptysis or wheezing. He has not reported any genitourinary complaints and has not reported any major changes in his bowel habits.  Medications: I have reviewed the patient's current medications.  Current Outpatient Prescriptions  Medication Sig Dispense Refill  .  abiraterone Acetate (ZYTIGA) 250 MG tablet Take 4 tablets (1,000 mg total) by mouth daily. Take on an empty stomach 1 hour before or 2 hours after a meal  120 tablet  0  . cholecalciferol (VITAMIN D) 1000 UNITS tablet Take 1,000 Units by mouth daily.      Marland Kitchen lisinopril-hydrochlorothiazide (PRINZIDE,ZESTORETIC) 20-25 MG per tablet TAKE ONE TABLET BY MOUTH EVERY DAY  90 tablet  3   No current facility-administered medications for this visit.     Allergies: No Known Allergies  Past Medical History, Surgical history, Social history, and Family History were reviewed and updated.  Review of Systems:  Remaining ROS negative. Physical Exam: Blood pressure 160/82, pulse 74, temperature 98.4 F (36.9 C), temperature source Oral, resp. rate 19, height 6\' 1"  (1.854 m), weight 217 lb 1.6 oz (98.476 kg). ECOG: 0 General appearance: alert, cooperative and appears stated age Head: Normocephalic, without obvious abnormality, atraumatic Neck: no adenopathy, no carotid bruit, no JVD, supple, symmetrical, trachea midline and thyroid not enlarged, symmetric, no tenderness/mass/nodules Lymph nodes: Cervical, supraclavicular, and axillary nodes normal. Heart:regular rate and rhythm, S1, S2 normal, no murmur, click, rub or gallop Lung:chest clear, no wheezing, rales, normal symmetric air entry Abdomen: soft, non-tender, without masses or organomegaly EXT:no erythema, induration, or nodules   Lab Results: Lab Results  Component Value Date   WBC 10.3 04/23/2013   HGB 11.3* 04/23/2013   HCT 34.1* 04/23/2013   MCV 86.7 04/23/2013   PLT 220 04/23/2013     Chemistry      Component Value Date/Time   NA 143 04/23/2013 1111   NA 139 05/25/2012 1522   K 3.8 04/23/2013 1111   K 4.2 05/25/2012 1522  CL 104 05/25/2012 1522   CO2 26 04/23/2013 1111   CO2 27 05/25/2012 1522   BUN 12.5 04/23/2013 1111   BUN 17 05/25/2012 1522   CREATININE 1.1 04/23/2013 1111   CREATININE 1.37* 05/25/2012 1522      Component Value Date/Time   CALCIUM  9.4 04/23/2013 1111   CALCIUM 11.0* 05/25/2012 1522   ALKPHOS 80 04/23/2013 1111   ALKPHOS 78 05/25/2012 1522   AST 18 04/23/2013 1111   AST 31 05/25/2012 1522   ALT 17 04/23/2013 1111   ALT 37 05/25/2012 1522   BILITOT 0.51 04/23/2013 1111   BILITOT 0.4 05/25/2012 1522      Results for Marcus, [name] (MRN [id number]) as of 04/30/2013 15:40  Ref. Range 03/23/2013 11:02 04/23/2013 11:11  PSA Latest Range: <=4.00 ng/mL 2.80 2.92      Impression and Plan:  68 year old gentleman with the following issues:  1. Castration resistant prostate cancer with metastatic disease to the bone. He has developed castration resistant disease and have been on [place] since August of 2014. His PSA have responded with a drop from 32 to 2.8. Now slightly up to 2.92. The plan is to continue with the current dose and schedule.  2. Androgen depravation: I recommended that he continues this and he will like to receive this here. His next injection will be in October of 2015.  3. Bone directed therapy: He is on Xgeva monthly also under Dr. Sammie Bench care. He would like to receive this here starting in May of 2015.  4. Followup: Will be in about 4  weeks.  Wyatt Portela 4/10/20154:01 PM

## 2013-04-30 NOTE — Telephone Encounter (Signed)
gv adn printed appt sched and avs for pt for May °

## 2013-05-03 NOTE — Telephone Encounter (Signed)
RECEIVED A FAX FROM [facility]ERNING A CONFIRMATION OF PRESCRIPTION SHIPMENT FOR ZYTIGA ON 04/30/13. 

## 2013-05-04 ENCOUNTER — Telehealth: Payer: Self-pay | Admitting: Oncology

## 2013-05-05 NOTE — Telephone Encounter (Signed)
Open error 

## 2013-06-01 ENCOUNTER — Other Ambulatory Visit: Payer: Medicare Other

## 2013-06-01 ENCOUNTER — Other Ambulatory Visit: Payer: Self-pay | Admitting: *Deleted

## 2013-06-01 DIAGNOSIS — C61 Malignant neoplasm of prostate: Secondary | ICD-10-CM

## 2013-06-01 MED ORDER — ABIRATERONE ACETATE 250 MG PO TABS: 1000.0000 mg | ORAL_TABLET | Freq: Every day | ORAL | Status: DC

## 2013-06-03 ENCOUNTER — Ambulatory Visit: Payer: Medicare Other | Admitting: Oncology

## 2013-06-04 NOTE — Telephone Encounter (Signed)
RECEIVED A FAX FROM [facility] CONCERNING A CONFIRMATION OF PRESCRIPTION SHIPMENT FOR ZYTIGA ON 06/03/13. 

## 2013-06-08 ENCOUNTER — Other Ambulatory Visit (HOSPITAL_BASED_OUTPATIENT_CLINIC_OR_DEPARTMENT_OTHER): Payer: Medicare Other

## 2013-06-08 DIAGNOSIS — C7952 Secondary malignant neoplasm of bone marrow: Secondary | ICD-10-CM

## 2013-06-08 DIAGNOSIS — C61 Malignant neoplasm of prostate: Secondary | ICD-10-CM

## 2013-06-08 DIAGNOSIS — C7951 Secondary malignant neoplasm of bone: Secondary | ICD-10-CM

## 2013-06-08 LAB — CBC WITH DIFFERENTIAL/PLATELET
BASO%: 0.3 % (ref 0.0–2.0)
Basophils Absolute: 0 10*3/uL (ref 0.0–0.1)
EOS ABS: 0.6 10*3/uL — AB (ref 0.0–0.5)
EOS%: 7.3 % — ABNORMAL HIGH (ref 0.0–7.0)
HEMATOCRIT: 36.2 % — AB (ref 38.4–49.9)
HEMOGLOBIN: 12 g/dL — AB (ref 13.0–17.1)
LYMPH#: 1.5 10*3/uL (ref 0.9–3.3)
LYMPH%: 19.8 % (ref 14.0–49.0)
MCH: 28.6 pg (ref 27.2–33.4)
MCHC: 33.1 g/dL (ref 32.0–36.0)
MCV: 86.2 fL (ref 79.3–98.0)
MONO#: 0.5 10*3/uL (ref 0.1–0.9)
MONO%: 6 % (ref 0.0–14.0)
NEUT%: 66.6 % (ref 39.0–75.0)
NEUTROS ABS: 5.1 10*3/uL (ref 1.5–6.5)
Platelets: 272 10*3/uL (ref 140–400)
RBC: 4.2 10*6/uL (ref 4.20–5.82)
RDW: 14.5 % (ref 11.0–14.6)
WBC: 7.7 10*3/uL (ref 4.0–10.3)

## 2013-06-08 LAB — COMPREHENSIVE METABOLIC PANEL (CC13)
ALT: 18 U/L (ref 0–55)
ANION GAP: 10 meq/L (ref 3–11)
AST: 15 U/L (ref 5–34)
Albumin: 3.4 g/dL — ABNORMAL LOW (ref 3.5–5.0)
Alkaline Phosphatase: 98 U/L (ref 40–150)
BUN: 13.9 mg/dL (ref 7.0–26.0)
CALCIUM: 10.1 mg/dL (ref 8.4–10.4)
CHLORIDE: 111 meq/L — AB (ref 98–109)
CO2: 23 meq/L (ref 22–29)
CREATININE: 1.2 mg/dL (ref 0.7–1.3)
GLUCOSE: 121 mg/dL (ref 70–140)
Potassium: 3.8 mEq/L (ref 3.5–5.1)
Sodium: 144 mEq/L (ref 136–145)
Total Bilirubin: 0.34 mg/dL (ref 0.20–1.20)
Total Protein: 6.7 g/dL (ref 6.4–8.3)

## 2013-06-09 LAB — PSA: PSA: 3.59 ng/mL (ref <=4.00)

## 2013-06-10 ENCOUNTER — Ambulatory Visit (HOSPITAL_BASED_OUTPATIENT_CLINIC_OR_DEPARTMENT_OTHER): Payer: Medicare Other | Admitting: Oncology

## 2013-06-10 ENCOUNTER — Telehealth: Payer: Self-pay | Admitting: Oncology

## 2013-06-10 ENCOUNTER — Encounter: Payer: Self-pay | Admitting: Oncology

## 2013-06-10 VITALS — BP 144/77 | HR 67 | Temp 98.3°F | Resp 20 | Ht 73.0 in | Wt 212.0 lb

## 2013-06-10 DIAGNOSIS — E291 Testicular hypofunction: Secondary | ICD-10-CM

## 2013-06-10 DIAGNOSIS — C7951 Secondary malignant neoplasm of bone: Secondary | ICD-10-CM

## 2013-06-10 DIAGNOSIS — C7952 Secondary malignant neoplasm of bone marrow: Secondary | ICD-10-CM

## 2013-06-10 DIAGNOSIS — C61 Malignant neoplasm of prostate: Secondary | ICD-10-CM

## 2013-06-10 NOTE — Progress Notes (Signed)
Hematology and Oncology Follow Up Visit  Marcus [name] [id number] Feb 28, 1945 68 y.o. 06/10/2013 1:32 PM   Principle Diagnosis: 68 year old gentleman diagnosed with prostate cancer in 2005. 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   Prior Therapy:  He is status post prostatectomy and subsequently was treated with salvage radiation therapy in 2007.  In February of 2013 his PSA was up to 22.5 and was started on androgen deprivation.  He developed castration resistant disease with documented bony metastasis despite combined androgen depravation with Lupron and Casodex. His last bone scan done on 09/18/2012 which showed progression of disease.  Current therapy:  Zytiga a total of 1000 mg started in August of 2014 for the PSA of 32.44. He is continued on Lupron and Xgeva continued at [facility] urology. His Xgeva to be given and the [place] in 06/2013.   Interim History:  Marcus [name] presents today for a followup visit with his wife. He has been on Zytiga since 08/2012 has tolerated it very well. He has not reported any complications at this time. He did not report any nausea or vomiting or abdominal pain. Did not report any urinary symptoms of hematuria dysuria or incontinence. He does report very little nocturia but good flow in general. He did not report any musculoskeletal pain arthralgias or myalgias. Did not report any lower extremity swelling. He is continued to perform activities of daily living without any hindrance or decline. He has not reported any neurological symptoms including headaches blurred vision or seizures. He has not reported any chest pain or difficulty breathing. Has not reported any cough or hemoptysis or wheezing. Has not reported any major changes in his bowel habits. He continues to be active with the church related activities. He did not report any skin changes or petechiae. Has not reported any lymphadenopathy. He did not report  any new complications related to the Zytiga. Rest of his review of system is unremarkable.  Medications: I have reviewed the patient's current medications.  Unchanged by my review. Current Outpatient Prescriptions  Medication Sig Dispense Refill  . abiraterone Acetate (ZYTIGA) 250 MG tablet Take 4 tablets (1,000 mg total) by mouth daily. Take on an empty stomach 1 hour before or 2 hours after a meal  120 tablet  0  . cholecalciferol (VITAMIN D) 1000 UNITS tablet Take 1,000 Units by mouth daily.      Marland Kitchen lisinopril-hydrochlorothiazide (PRINZIDE,ZESTORETIC) 20-25 MG per tablet TAKE ONE TABLET BY MOUTH EVERY DAY  90 tablet  3   No current facility-administered medications for this visit.     Allergies: No Known Allergies   Physical Exam: Blood pressure 144/77, pulse 67, temperature 98.3 F (36.8 C), temperature source Oral, resp. rate 20, height 6\' 1"  (1.854 m), weight 212 lb (96.163 kg). ECOG: 0 General appearance: alert and cooperative Head: Normocephalic, without obvious abnormality Neck: no adenopathy and No masses or thyromegaly Lymph nodes: Cervical, supraclavicular, and axillary nodes normal. Heart:regular rate and rhythm, S1, S2.  Lung:chest clear, no wheezing, rales. Nonetheless to percussion. Abdomen: soft, non-tender, without masses or organomegaly EXT:no erythema, induration, no edema noted. Neurological: No deficits.   Lab Results: Lab Results  Component Value Date   WBC 7.7 06/08/2013   HGB 12.0* 06/08/2013   HCT 36.2* 06/08/2013   MCV 86.2 06/08/2013   PLT 272 06/08/2013     Chemistry      Component Value Date/Time   NA 144 06/08/2013 1034   NA 139 05/25/2012 1522  K 3.8 06/08/2013 1034   K 4.2 05/25/2012 1522   CL 104 05/25/2012 1522   CO2 23 06/08/2013 1034   CO2 27 05/25/2012 1522   BUN 13.9 06/08/2013 1034   BUN 17 05/25/2012 1522   CREATININE 1.2 06/08/2013 1034   CREATININE 1.37* 05/25/2012 1522      Component Value Date/Time   CALCIUM 10.1 06/08/2013 1034   CALCIUM  11.0* 05/25/2012 1522   ALKPHOS 98 06/08/2013 1034   ALKPHOS 78 05/25/2012 1522   AST 15 06/08/2013 1034   AST 31 05/25/2012 1522   ALT 18 06/08/2013 1034   ALT 37 05/25/2012 1522   BILITOT 0.34 06/08/2013 1034   BILITOT 0.4 05/25/2012 1522       Results for Marcus [name], Marcus [name] (MRN [id number]) as of 06/10/2013 13:11  Ref. Range 04/23/2013 11:11 06/08/2013 10:35  PSA Latest Range: <=4.00 ng/mL 2.92 3.59      Impression and Plan:  68 year old gentleman with the following issues:  1. Castration resistant prostate cancer with metastatic disease to the bone. He has developed castration resistant disease and have been on [place] since August of 2014. His PSA have responded with a drop from 32 to 2.8. Now slightly up to 3.59. His PSAs and bone scans were reviewed again with the patient and his wife and I feel that despite the recent rise in his PSA he continued to have excellent disease control. I plan to continue on this current medication and change it if his PSA start to rise rapidly.  2. Androgen depravation: I recommended that he continues this and he will like to receive this here. His next injection will be in October of 2015.  3. Bone directed therapy: He is on Xgeva monthly also under Dr. Sammie Bench care. He would like to receive this here starting in June of 2015.  4. Followup: Will be in about 4  weeks.  Marcus [name] 5/21/20151:32 PM

## 2013-06-10 NOTE — Telephone Encounter (Signed)
, °

## 2013-06-28 ENCOUNTER — Encounter: Payer: Self-pay | Admitting: Family Medicine

## 2013-06-28 ENCOUNTER — Ambulatory Visit (INDEPENDENT_AMBULATORY_CARE_PROVIDER_SITE_OTHER): Payer: Medicare Other | Admitting: Family Medicine

## 2013-06-28 VITALS — BP 120/84 | HR 60 | Ht 74.0 in | Wt 211.0 lb

## 2013-06-28 DIAGNOSIS — Z8709 Personal history of other diseases of the respiratory system: Secondary | ICD-10-CM

## 2013-06-28 DIAGNOSIS — C61 Malignant neoplasm of prostate: Secondary | ICD-10-CM

## 2013-06-28 DIAGNOSIS — J309 Allergic rhinitis, unspecified: Secondary | ICD-10-CM

## 2013-06-28 DIAGNOSIS — Z0000 Encounter for general adult medical examination without abnormal findings: Secondary | ICD-10-CM

## 2013-06-28 DIAGNOSIS — E785 Hyperlipidemia, unspecified: Secondary | ICD-10-CM

## 2013-06-28 DIAGNOSIS — I10 Essential (primary) hypertension: Secondary | ICD-10-CM

## 2013-06-28 DIAGNOSIS — Z23 Encounter for immunization: Secondary | ICD-10-CM

## 2013-06-28 LAB — POCT URINALYSIS DIPSTICK
Bilirubin, UA: NEGATIVE
Glucose, UA: NEGATIVE
KETONES UA: NEGATIVE
Leukocytes, UA: NEGATIVE
Nitrite, UA: NEGATIVE
PH UA: 5
RBC UA: NEGATIVE
SPEC GRAV UA: 1.02
UROBILINOGEN UA: NEGATIVE

## 2013-06-28 LAB — LIPID PANEL
CHOL/HDL RATIO: 5.5 ratio
Cholesterol: 216 mg/dL — ABNORMAL HIGH (ref 0–200)
HDL: 39 mg/dL — ABNORMAL LOW (ref >39)
LDL CALC: 139 mg/dL — AB (ref 0–99)
Triglycerides: 190 mg/dL — ABNORMAL HIGH (ref <150)
VLDL: 38 mg/dL (ref 0–40)

## 2013-06-28 NOTE — Progress Notes (Signed)
   Subjective:    Patient ID: Marcus [name], male    DOB: May 27, 1945, 68 y.o.   MRN: [id number]  HPI He is here for complete examination. He does have a history of prostate cancer and is presently being cared for by urology. His allergies are under good control he does have a previous history of asthma. He continues on medications listed in the chart. His immunizations were reviewed and he will need another pneumococcal vaccine. He is quite happy with his present wife and is retired. He is married and this is going well. His had no cognitive difficulties. He keeps himself quite physically active. He has had colonoscopy. BMI is recorded. Social and family history were also reviewed.   Review of Systems  All other systems reviewed and are negative.      Objective:   Physical Exam BP 120/84  Pulse 60  Ht 6\' 2"  (1.88 m)  Wt 211 lb (95.709 kg)  BMI 27.08 kg/m2  General Appearance:    Alert, cooperative, no distress, appears stated age  Head:    Normocephalic, without obvious abnormality, atraumatic  Eyes:    PERRL, conjunctiva/corneas clear, EOM's  intact, fundi    benign  Ears:    Normal TM's and external ear canals  Nose:   Nares normal, mucosa normal, no drainage or sinus   tenderness  Throat:   Lips, mucosa, and tongue normal; teeth and gums normal  Neck:   Supple, no lymphadenopathy;  thyroid:  no   enlargement/tenderness/nodules; no carotid   bruit or JVD  Back:    Spine nontender, no curvature, ROM normal, no CVA     tenderness  Lungs:     Clear to auscultation bilaterally without wheezes, rales or     ronchi; respirations unlabored  Chest Wall:    No tenderness or deformity   Heart:    Regular rate and rhythm, S1 and S2 normal, no murmur, rub   or gallop  Breast Exam:    No chest wall tenderness, masses or gynecomastia  Abdomen:     Soft, non-tender, nondistended, normoactive bowel sounds,    no masses, no hepatosplenomegaly        Extremities:   No clubbing, cyanosis or  edema  Pulses:   2+ and symmetric all extremities  Skin:   Skin color, texture, turgor normal, no rashes or lesions  Lymph nodes:   Cervical, supraclavicular, and axillary nodes normal  Neurologic:   CNII-XII intact, normal strength, sensation and gait; reflexes 2+ and symmetric throughout          Psych:   Normal mood, affect, hygiene and grooming.          Assessment & Plan:  Routine general medical examination at a health care facility - Plan: Urinalysis Dipstick  Need for prophylactic vaccination against Streptococcus pneumoniae (pneumococcus) - Plan: Pneumococcal conjugate vaccine 13-valent  Prostate cancer  Hypertension  Hyperlipidemia LDL goal < 100 - Plan: Lipid panel  Allergic rhinitis, mild  History of asthma he will continue to be followed by urology. Pneumococcal vaccine given.

## 2013-06-29 ENCOUNTER — Telehealth: Payer: Self-pay | Admitting: Internal Medicine

## 2013-06-29 DIAGNOSIS — I10 Essential (primary) hypertension: Secondary | ICD-10-CM

## 2013-06-29 MED ORDER — LISINOPRIL-HYDROCHLOROTHIAZIDE 20-25 MG PO TABS: ORAL_TABLET | ORAL | Status: DC

## 2013-06-29 NOTE — Telephone Encounter (Signed)
Pt called and states that pharmacy did not receive his bp med. i have resent that in

## 2013-06-30 ENCOUNTER — Other Ambulatory Visit: Payer: Self-pay | Admitting: *Deleted

## 2013-06-30 ENCOUNTER — Telehealth: Payer: Self-pay | Admitting: *Deleted

## 2013-06-30 DIAGNOSIS — C61 Malignant neoplasm of prostate: Secondary | ICD-10-CM

## 2013-06-30 MED ORDER — ABIRATERONE ACETATE 250 MG PO TABS: 1000.0000 mg | ORAL_TABLET | Freq: Every day | ORAL | Status: DC

## 2013-06-30 NOTE — Telephone Encounter (Signed)
Refill request to MD desk

## 2013-07-06 NOTE — Telephone Encounter (Signed)
RECEIVED A FAX FROM [facility] CONCERNING A CONFIRMATION OF PRESCRIPTION SHIPMENT FOR [place] ON 07/05/13.

## 2013-07-14 ENCOUNTER — Other Ambulatory Visit (HOSPITAL_BASED_OUTPATIENT_CLINIC_OR_DEPARTMENT_OTHER): Payer: Medicare Other

## 2013-07-14 DIAGNOSIS — C61 Malignant neoplasm of prostate: Secondary | ICD-10-CM

## 2013-07-14 LAB — CBC WITH DIFFERENTIAL/PLATELET
BASO%: 0.9 % (ref 0.0–2.0)
BASOS ABS: 0.1 10*3/uL (ref 0.0–0.1)
EOS ABS: 0.5 10*3/uL (ref 0.0–0.5)
EOS%: 6.1 % (ref 0.0–7.0)
HCT: 35.8 % — ABNORMAL LOW (ref 38.4–49.9)
HEMOGLOBIN: 11.9 g/dL — AB (ref 13.0–17.1)
LYMPH#: 1.5 10*3/uL (ref 0.9–3.3)
LYMPH%: 16.8 % (ref 14.0–49.0)
MCH: 29.2 pg (ref 27.2–33.4)
MCHC: 33.3 g/dL (ref 32.0–36.0)
MCV: 87.7 fL (ref 79.3–98.0)
MONO#: 0.7 10*3/uL (ref 0.1–0.9)
MONO%: 7.7 % (ref 0.0–14.0)
NEUT%: 68.5 % (ref 39.0–75.0)
NEUTROS ABS: 6 10*3/uL (ref 1.5–6.5)
Platelets: 268 10*3/uL (ref 140–400)
RBC: 4.08 10*6/uL — AB (ref 4.20–5.82)
RDW: 14.5 % (ref 11.0–14.6)
WBC: 8.8 10*3/uL (ref 4.0–10.3)

## 2013-07-14 LAB — COMPREHENSIVE METABOLIC PANEL (CC13)
ALK PHOS: 101 U/L (ref 40–150)
ALT: 16 U/L (ref 0–55)
AST: 15 U/L (ref 5–34)
Albumin: 3.3 g/dL — ABNORMAL LOW (ref 3.5–5.0)
Anion Gap: 7 mEq/L (ref 3–11)
BUN: 16.2 mg/dL (ref 7.0–26.0)
CHLORIDE: 109 meq/L (ref 98–109)
CO2: 25 mEq/L (ref 22–29)
Calcium: 9.9 mg/dL (ref 8.4–10.4)
Creatinine: 1.1 mg/dL (ref 0.7–1.3)
GLUCOSE: 105 mg/dL (ref 70–140)
Potassium: 3.9 mEq/L (ref 3.5–5.1)
Sodium: 141 mEq/L (ref 136–145)
TOTAL PROTEIN: 6.9 g/dL (ref 6.4–8.3)
Total Bilirubin: 0.26 mg/dL (ref 0.20–1.20)

## 2013-07-15 LAB — PSA: PSA: 4.5 ng/mL — ABNORMAL HIGH (ref <=4.00)

## 2013-07-16 ENCOUNTER — Telehealth: Payer: Self-pay | Admitting: Oncology

## 2013-07-16 ENCOUNTER — Encounter: Payer: Self-pay | Admitting: Oncology

## 2013-07-16 ENCOUNTER — Ambulatory Visit: Payer: Medicare Other

## 2013-07-16 ENCOUNTER — Ambulatory Visit (HOSPITAL_BASED_OUTPATIENT_CLINIC_OR_DEPARTMENT_OTHER): Payer: Medicare Other | Admitting: Oncology

## 2013-07-16 VITALS — BP 137/86 | HR 64 | Temp 98.4°F | Resp 19 | Ht 74.0 in | Wt 213.9 lb

## 2013-07-16 DIAGNOSIS — C61 Malignant neoplasm of prostate: Secondary | ICD-10-CM | POA: Diagnosis not present

## 2013-07-16 DIAGNOSIS — C7951 Secondary malignant neoplasm of bone: Secondary | ICD-10-CM

## 2013-07-16 DIAGNOSIS — C7952 Secondary malignant neoplasm of bone marrow: Secondary | ICD-10-CM

## 2013-07-16 MED ORDER — DENOSUMAB 120 MG/1.7ML ~~LOC~~ SOLN
120.0000 mg | Freq: Once | SUBCUTANEOUS | Status: AC
  Administered 2013-07-16: 120 mg via SUBCUTANEOUS
  Filled 2013-07-16: qty 1.7

## 2013-07-16 NOTE — Progress Notes (Signed)
Hematology and Oncology Follow Up Visit  Marcus [name] [id number] 09-07-1945 68 y.o. 07/16/2013 1:34 PM   Principle Diagnosis: 69 year old gentleman diagnosed with prostate cancer in 2005. 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   Prior Therapy:  He is status post prostatectomy and subsequently was treated with salvage radiation therapy in 2007.  In February of 2013 his PSA was up to 22.5 and was started on androgen deprivation.  He developed castration resistant disease with documented bony metastasis despite combined androgen depravation with Lupron and Casodex. His last bone scan done on 09/18/2012 which showed progression of disease.  Current therapy:  Zytiga a total of 1000 mg started in August of 2014 for the PSA of 32.44. He is continued on Lupron at [facility] urology. His Xgeva to be given and the [place] in 06/2013.   Interim History:  Marcus [name] presents today for a followup visit with his wife. Since his last visit he has been doing very well. He has not reported any complications at this time. He did not report any nausea or vomiting or abdominal pain. Did not report any urinary symptoms of hematuria dysuria or incontinence. He did not report any musculoskeletal pain arthralgias or myalgias. Did not report any lower extremity swelling. He is continued to perform activities of daily living without any hindrance or decline. He has not reported any neurological symptoms including headaches blurred vision or seizures. He has not reported any chest pain or difficulty breathing. Has not reported any cough or hemoptysis or wheezing. Has not reported any major changes in his bowel habits. He did not report any skin changes or petechiae. Has not reported any lymphadenopathy. He did not report any new complications related to the Zytiga. He has not reported any pathological fractures or hospitalizations. Rest of his review of system is  unremarkable.  Medications: I have reviewed the patient's current medications.  Unchanged by my review. Current Outpatient Prescriptions  Medication Sig Dispense Refill  . abiraterone Acetate (ZYTIGA) 250 MG tablet Take 4 tablets (1,000 mg total) by mouth daily. Take on an empty stomach 1 hour before or 2 hours after a meal  120 tablet  0  . cholecalciferol (VITAMIN D) 1000 UNITS tablet Take 1,000 Units by mouth daily.      Marland Kitchen lisinopril-hydrochlorothiazide (PRINZIDE,ZESTORETIC) 20-25 MG per tablet TAKE ONE TABLET BY MOUTH EVERY DAY  90 tablet  3   No current facility-administered medications for this visit.     Allergies: No Known Allergies   Physical Exam: Blood pressure 137/86, pulse 64, temperature 98.4 F (36.9 C), temperature source Oral, resp. rate 19, height 6\' 2"  (1.88 m), weight 213 lb 14.4 oz (97.024 kg), SpO2 99.00%. ECOG: 0 General appearance: alert Head: Normocephalic, without obvious abnormality Neck: no adenopathy and No masses or thyromegaly. Lymph nodes: Cervical, supraclavicular, and axillary nodes normal. Heart:regular rate and rhythm, S1, S2. No murmurs rubs or gallops. Lung:chest clear, no wheezing, rales. No dullness to percussion. Abdomen: soft, non-tender, without masses or organomegaly no shifting dullness EXT:no erythema, induration, no edema noted. Neurological: No deficits.   Lab Results: Lab Results  Component Value Date   WBC 8.8 07/14/2013   HGB 11.9* 07/14/2013   HCT 35.8* 07/14/2013   MCV 87.7 07/14/2013   PLT 268 07/14/2013     Chemistry      Component Value Date/Time   NA 141 07/14/2013 1048   NA 139 05/25/2012 1522   K 3.9 07/14/2013 1048  K 4.2 05/25/2012 1522   CL 104 05/25/2012 1522   CO2 25 07/14/2013 1048   CO2 27 05/25/2012 1522   BUN 16.2 07/14/2013 1048   BUN 17 05/25/2012 1522   CREATININE 1.1 07/14/2013 1048   CREATININE 1.37* 05/25/2012 1522      Component Value Date/Time   CALCIUM 9.9 07/14/2013 1048   CALCIUM 11.0* 05/25/2012 1522    ALKPHOS 101 07/14/2013 1048   ALKPHOS 78 05/25/2012 1522   AST 15 07/14/2013 1048   AST 31 05/25/2012 1522   ALT 16 07/14/2013 1048   ALT 37 05/25/2012 1522   BILITOT 0.26 07/14/2013 1048   BILITOT 0.4 05/25/2012 1522        Results for Marcus [name] (MRN [id number]) as of 07/16/2013 13:36  Ref. Range 04/23/2013 11:11 06/08/2013 10:35 07/14/2013 10:48  PSA Latest Range: <=4.00 ng/mL 2.92 3.59 4.50 (H)      Impression and Plan:  68 year old gentleman with the following issues:  1. Castration resistant prostate cancer with metastatic disease to the bone. He has developed castration resistant disease and have been on [place] since August of 2014. His PSA showed a slight increase of to 4.5 from 3.59 last month. His PSA has declined though from 32.44 in August of 2014 to the current level. Overall, I feel he is continue to benefit from Zytiga despite the slow rise in his PSA. If his PSA develops a rapid rise, we will restage him and consider a different salvage therapy.  2. Androgen depravation: I recommended that he continues this and he will like to receive this here. His next injection will be in October of 2015.  3. Bone directed therapy: He is on Xgeva monthly which he will be receiving here at this time. He will continue calcium and vitamin D supplement as well as continuous education about osteonecrosis of the jaw. Patient will to proceed today as discussed.  4. Followup: Will be in about 4  weeks.  SWNIOE,VOJJK 6/26/20151:34 PM

## 2013-07-16 NOTE — Telephone Encounter (Signed)
Gave pt appt for lab and MD for July 2015 °

## 2013-07-16 NOTE — Addendum Note (Signed)
Addended by: Randolm Idol on: 07/16/2013 01:56 PM   Modules accepted: Orders

## 2013-08-03 ENCOUNTER — Other Ambulatory Visit: Payer: Self-pay | Admitting: Medical Oncology

## 2013-08-03 ENCOUNTER — Other Ambulatory Visit: Payer: Self-pay | Admitting: *Deleted

## 2013-08-03 DIAGNOSIS — C61 Malignant neoplasm of prostate: Secondary | ICD-10-CM

## 2013-08-03 MED ORDER — ABIRATERONE ACETATE 250 MG PO TABS: 1000.0000 mg | ORAL_TABLET | Freq: Every day | ORAL | Status: DC

## 2013-08-03 NOTE — Telephone Encounter (Signed)
Zytiga prescription refill faxed to [facility] @ [phone]

## 2013-08-03 NOTE — Telephone Encounter (Signed)
THIS REFILL REQUEST FOR ZYTIGA WAS PLACED IN DR.SHADAD'S ACTIVE WORK FOLDER. 

## 2013-08-05 NOTE — Telephone Encounter (Signed)
RECEIVED A FAX FROM [facility] CONCERNING A CONFIRMATION OF PRESCRIPTION SHIPMENT FOR Marcus [name] ON 08/04/13.

## 2013-08-18 ENCOUNTER — Other Ambulatory Visit (HOSPITAL_BASED_OUTPATIENT_CLINIC_OR_DEPARTMENT_OTHER): Payer: Medicare Other

## 2013-08-18 DIAGNOSIS — C61 Malignant neoplasm of prostate: Secondary | ICD-10-CM

## 2013-08-18 LAB — CBC WITH DIFFERENTIAL/PLATELET
BASO%: 0.5 % (ref 0.0–2.0)
BASOS ABS: 0 10*3/uL (ref 0.0–0.1)
EOS%: 8.4 % — ABNORMAL HIGH (ref 0.0–7.0)
Eosinophils Absolute: 0.6 10*3/uL — ABNORMAL HIGH (ref 0.0–0.5)
HEMATOCRIT: 35.2 % — AB (ref 38.4–49.9)
HEMOGLOBIN: 11.7 g/dL — AB (ref 13.0–17.1)
LYMPH%: 21.8 % (ref 14.0–49.0)
MCH: 29.1 pg (ref 27.2–33.4)
MCHC: 33.2 g/dL (ref 32.0–36.0)
MCV: 87.6 fL (ref 79.3–98.0)
MONO#: 0.4 10*3/uL (ref 0.1–0.9)
MONO%: 6.6 % (ref 0.0–14.0)
NEUT#: 4.2 10*3/uL (ref 1.5–6.5)
NEUT%: 62.7 % (ref 39.0–75.0)
Platelets: 243 10*3/uL (ref 140–400)
RBC: 4.02 10*6/uL — ABNORMAL LOW (ref 4.20–5.82)
RDW: 14.6 % (ref 11.0–14.6)
WBC: 6.7 10*3/uL (ref 4.0–10.3)
lymph#: 1.5 10*3/uL (ref 0.9–3.3)

## 2013-08-18 LAB — COMPREHENSIVE METABOLIC PANEL (CC13)
ALT: 15 U/L (ref 0–55)
ANION GAP: 4 meq/L (ref 3–11)
AST: 17 U/L (ref 5–34)
Albumin: 3.3 g/dL — ABNORMAL LOW (ref 3.5–5.0)
Alkaline Phosphatase: 89 U/L (ref 40–150)
BUN: 16.8 mg/dL (ref 7.0–26.0)
CO2: 29 meq/L (ref 22–29)
CREATININE: 1.2 mg/dL (ref 0.7–1.3)
Calcium: 9.6 mg/dL (ref 8.4–10.4)
Chloride: 111 mEq/L — ABNORMAL HIGH (ref 98–109)
Glucose: 101 mg/dl (ref 70–140)
Potassium: 4.1 mEq/L (ref 3.5–5.1)
Sodium: 143 mEq/L (ref 136–145)
Total Bilirubin: 0.26 mg/dL (ref 0.20–1.20)
Total Protein: 6.5 g/dL (ref 6.4–8.3)

## 2013-08-19 LAB — PSA: PSA: 4.12 ng/mL — ABNORMAL HIGH (ref <=4.00)

## 2013-08-20 ENCOUNTER — Telehealth: Payer: Self-pay | Admitting: Oncology

## 2013-08-20 ENCOUNTER — Ambulatory Visit (HOSPITAL_BASED_OUTPATIENT_CLINIC_OR_DEPARTMENT_OTHER): Payer: PRIVATE HEALTH INSURANCE | Admitting: Oncology

## 2013-08-20 VITALS — BP 145/89 | HR 69 | Temp 98.7°F | Resp 19 | Ht 74.0 in | Wt 214.9 lb

## 2013-08-20 DIAGNOSIS — C7952 Secondary malignant neoplasm of bone marrow: Secondary | ICD-10-CM

## 2013-08-20 DIAGNOSIS — C7951 Secondary malignant neoplasm of bone: Secondary | ICD-10-CM

## 2013-08-20 DIAGNOSIS — E291 Testicular hypofunction: Secondary | ICD-10-CM

## 2013-08-20 DIAGNOSIS — C61 Malignant neoplasm of prostate: Secondary | ICD-10-CM

## 2013-08-20 MED ORDER — DENOSUMAB 120 MG/1.7ML ~~LOC~~ SOLN
120.0000 mg | Freq: Once | SUBCUTANEOUS | Status: AC
  Administered 2013-08-20: 120 mg via SUBCUTANEOUS
  Filled 2013-08-20: qty 1.7

## 2013-08-20 NOTE — Progress Notes (Signed)
Hematology and Oncology Follow Up Visit  Marcus [name] [id number] 04-10-1945 68 y.o. 08/20/2013 11:59 AM   Principle Diagnosis: 68 year old gentleman diagnosed with prostate cancer in 2005. 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   Prior Therapy:  He is status post prostatectomy and subsequently was treated with salvage radiation therapy in 2007.  In February of 2013 his PSA was up to 22.5 and was started on androgen deprivation.  He developed castration resistant disease with documented bony metastasis despite combined androgen depravation with Lupron and Casodex. His last bone scan done on 09/18/2012 which showed progression of disease.  Current therapy:  Zytiga a total of 1000 mg started in August of 2014 for the PSA of 32.44. He is continued on Lupron at [facility] urology. His Xgeva to be given and the [place] in 06/2013.   Interim History:  Marcus [name] presents today for a followup visit with his wife. Since his last visit, he has not reported any complications at this time. He did not report any nausea or vomiting or abdominal pain. Did not report any urinary symptoms of hematuria dysuria or incontinence. He did not report any musculoskeletal pain arthralgias or myalgias. Did not report any lower extremity swelling. He is continued to perform activities of daily living without any hindrance or decline. He still cares for his yard as well as the church his yard working excessively outdoors without any decline. He has not reported any neurological symptoms including headaches blurred vision or seizures. He has not reported any chest pain or difficulty breathing. Has not reported any cough or hemoptysis or wheezing. Has not reported any major changes in his bowel habits. He did not report any skin changes or petechiae. Has not reported any lymphadenopathy. He did not report any new complications related to the Zytiga. He has not reported any  pathological fractures or hospitalizations. Rest of his review of system is unremarkable.  Medications: I have reviewed the patient's current medications.  Unchanged by my review. Current Outpatient Prescriptions  Medication Sig Dispense Refill  . abiraterone Acetate (ZYTIGA) 250 MG tablet Take 4 tablets (1,000 mg total) by mouth daily. Take on an empty stomach 1 hour before or 2 hours after a meal  120 tablet  0  . cholecalciferol (VITAMIN D) 1000 UNITS tablet Take 1,000 Units by mouth daily.      Marland Kitchen lisinopril-hydrochlorothiazide (PRINZIDE,ZESTORETIC) 20-25 MG per tablet TAKE ONE TABLET BY MOUTH EVERY DAY  90 tablet  3   No current facility-administered medications for this visit.     Allergies: No Known Allergies   Physical Exam: Blood pressure 145/89, pulse 69, temperature 98.7 F (37.1 C), temperature source Oral, resp. rate 19, height 6\' 2"  (1.88 m), weight 214 lb 14.4 oz (97.478 kg). ECOG: 0 General appearance: alert and awake not in any distress Head: Normocephalic, without obvious abnormality Neck: no adenopathy and No masses or thyromegaly. Lymph nodes: Cervical, supraclavicular, and axillary nodes normal. Heart:regular rate and rhythm, S1, S2. No murmurs rubs or gallops. Lung:chest clear, no wheezing, rales.  Abdomen: soft, non-tender, without masses or organomegaly no shifting dullness EXT:no erythema, induration, no edema noted. Neurological: No deficits.   Lab Results: Lab Results  Component Value Date   WBC 6.7 08/18/2013   HGB 11.7* 08/18/2013   HCT 35.2* 08/18/2013   MCV 87.6 08/18/2013   PLT 243 08/18/2013     Chemistry      Component Value Date/Time   NA 143 08/18/2013 1113  NA 139 05/25/2012 1522   K 4.1 08/18/2013 1113   K 4.2 05/25/2012 1522   CL 104 05/25/2012 1522   CO2 29 08/18/2013 1113   CO2 27 05/25/2012 1522   BUN 16.8 08/18/2013 1113   BUN 17 05/25/2012 1522   CREATININE 1.2 08/18/2013 1113   CREATININE 1.37* 05/25/2012 1522      Component Value  Date/Time   CALCIUM 9.6 08/18/2013 1113   CALCIUM 11.0* 05/25/2012 1522   ALKPHOS 89 08/18/2013 1113   ALKPHOS 78 05/25/2012 1522   AST 17 08/18/2013 1113   AST 31 05/25/2012 1522   ALT 15 08/18/2013 1113   ALT 37 05/25/2012 1522   BILITOT 0.26 08/18/2013 1113   BILITOT 0.4 05/25/2012 1522        Results for IBRAHIMA, HOLBERG (MRN [id number]) as of 08/20/2013 11:51  Ref. Range 07/14/2013 10:48 08/18/2013 11:13  PSA Latest Range: <=4.00 ng/mL 4.50 (H) 4.12 (H)       Impression and Plan:  68 year old gentleman with the following issues:  1. Castration resistant prostate cancer with metastatic disease to the bone. He has developed castration resistant disease and have been on [place] since August of 2014. His PSA have been relatively stable as of late between 3 and 4. His PSA has declined though from 32.44 in August of 2014 to the current level. Overall, I feel he is continue to benefit from Zytiga despite the slow rise in his PSA. If his PSA develops a rapid rise, we will restage him and consider a different salvage therapy.  2. Androgen depravation: I recommended that he continues this and he will like to receive this here. His next injection will be in October of 2015.  3. Bone directed therapy: He is on Xgeva monthly which he will be receiving here at this time. He will continue calcium and vitamin D supplement as well as continuous education about osteonecrosis of the jaw. He has no issues continuing axes at this time.  4. Followup: Will be in about 4  weeks.  [name] 7/31/201511:59 AM

## 2013-08-20 NOTE — Telephone Encounter (Signed)
gv adn printed aptp sched and avs for pt for :SEpt

## 2013-08-30 ENCOUNTER — Other Ambulatory Visit: Payer: Self-pay | Admitting: *Deleted

## 2013-08-30 NOTE — Telephone Encounter (Signed)
THIS REFILL REQUEST FOR ZYTIGA WAS PLACED IN DR.SHADAD'S ACTIVE WORK FOLDER.

## 2013-08-31 ENCOUNTER — Other Ambulatory Visit: Payer: Self-pay | Admitting: *Deleted

## 2013-08-31 DIAGNOSIS — C61 Malignant neoplasm of prostate: Secondary | ICD-10-CM

## 2013-08-31 MED ORDER — ABIRATERONE ACETATE 250 MG PO TABS: 1000.0000 mg | ORAL_TABLET | Freq: Every day | ORAL | Status: DC

## 2013-09-02 NOTE — Telephone Encounter (Signed)
RECEIVED A FAX FROM [facility] CONCERNING A CONFIRMATION OF PRESCRIPTION SHIPMENT FOR [place] ON 09/01/13.

## 2013-09-21 ENCOUNTER — Other Ambulatory Visit (HOSPITAL_BASED_OUTPATIENT_CLINIC_OR_DEPARTMENT_OTHER): Payer: Medicare Other

## 2013-09-21 DIAGNOSIS — C7951 Secondary malignant neoplasm of bone: Secondary | ICD-10-CM

## 2013-09-21 DIAGNOSIS — C61 Malignant neoplasm of prostate: Secondary | ICD-10-CM

## 2013-09-21 DIAGNOSIS — C7952 Secondary malignant neoplasm of bone marrow: Secondary | ICD-10-CM

## 2013-09-21 DIAGNOSIS — E291 Testicular hypofunction: Secondary | ICD-10-CM

## 2013-09-21 LAB — COMPREHENSIVE METABOLIC PANEL (CC13)
ALT: 18 U/L (ref 0–55)
AST: 17 U/L (ref 5–34)
Albumin: 3.3 g/dL — ABNORMAL LOW (ref 3.5–5.0)
Alkaline Phosphatase: 83 U/L (ref 40–150)
Anion Gap: 7 mEq/L (ref 3–11)
BILIRUBIN TOTAL: 0.4 mg/dL (ref 0.20–1.20)
BUN: 15.8 mg/dL (ref 7.0–26.0)
CO2: 25 mEq/L (ref 22–29)
CREATININE: 1.1 mg/dL (ref 0.7–1.3)
Calcium: 9.7 mg/dL (ref 8.4–10.4)
Chloride: 109 mEq/L (ref 98–109)
Glucose: 104 mg/dl (ref 70–140)
Potassium: 4.3 mEq/L (ref 3.5–5.1)
Sodium: 141 mEq/L (ref 136–145)
Total Protein: 6.8 g/dL (ref 6.4–8.3)

## 2013-09-21 LAB — CBC WITH DIFFERENTIAL/PLATELET
BASO%: 0.6 % (ref 0.0–2.0)
Basophils Absolute: 0.1 10*3/uL (ref 0.0–0.1)
EOS%: 9.2 % — ABNORMAL HIGH (ref 0.0–7.0)
Eosinophils Absolute: 0.8 10*3/uL — ABNORMAL HIGH (ref 0.0–0.5)
HCT: 37 % — ABNORMAL LOW (ref 38.4–49.9)
HGB: 12.1 g/dL — ABNORMAL LOW (ref 13.0–17.1)
LYMPH%: 17 % (ref 14.0–49.0)
MCH: 28.9 pg (ref 27.2–33.4)
MCHC: 32.8 g/dL (ref 32.0–36.0)
MCV: 88 fL (ref 79.3–98.0)
MONO#: 0.8 10*3/uL (ref 0.1–0.9)
MONO%: 9.1 % (ref 0.0–14.0)
NEUT#: 5.5 10*3/uL (ref 1.5–6.5)
NEUT%: 64.1 % (ref 39.0–75.0)
PLATELETS: 255 10*3/uL (ref 140–400)
RBC: 4.21 10*6/uL (ref 4.20–5.82)
RDW: 14.1 % (ref 11.0–14.6)
WBC: 8.5 10*3/uL (ref 4.0–10.3)
lymph#: 1.4 10*3/uL (ref 0.9–3.3)

## 2013-09-22 LAB — PSA: PSA: 5.82 ng/mL — ABNORMAL HIGH (ref <=4.00)

## 2013-09-23 ENCOUNTER — Ambulatory Visit (HOSPITAL_BASED_OUTPATIENT_CLINIC_OR_DEPARTMENT_OTHER): Payer: PRIVATE HEALTH INSURANCE | Admitting: Oncology

## 2013-09-23 ENCOUNTER — Telehealth: Payer: Self-pay | Admitting: Oncology

## 2013-09-23 ENCOUNTER — Encounter: Payer: Self-pay | Admitting: Oncology

## 2013-09-23 VITALS — BP 149/99 | HR 69 | Temp 98.4°F | Resp 18 | Ht 74.0 in | Wt 214.1 lb

## 2013-09-23 DIAGNOSIS — C7951 Secondary malignant neoplasm of bone: Secondary | ICD-10-CM

## 2013-09-23 DIAGNOSIS — C61 Malignant neoplasm of prostate: Secondary | ICD-10-CM

## 2013-09-23 DIAGNOSIS — C7952 Secondary malignant neoplasm of bone marrow: Secondary | ICD-10-CM

## 2013-09-23 DIAGNOSIS — E291 Testicular hypofunction: Secondary | ICD-10-CM

## 2013-09-23 MED ORDER — DENOSUMAB 120 MG/1.7ML ~~LOC~~ SOLN
120.0000 mg | Freq: Once | SUBCUTANEOUS | Status: AC
  Administered 2013-09-23: 120 mg via SUBCUTANEOUS
  Filled 2013-09-23: qty 1.7

## 2013-09-23 NOTE — Progress Notes (Signed)
Hematology and Oncology Follow Up Visit  Marcus [name] [id number] 02-Sep-1945 68 y.o. 09/23/2013 10:00 AM   Principle Diagnosis: 68 year old gentleman diagnosed with prostate cancer in 2005. 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   Prior Therapy:  He is status post prostatectomy and subsequently was treated with salvage radiation therapy in 2007.  In February of 2013 his PSA was up to 22.5 and was started on androgen deprivation.  He developed castration resistant disease with documented bony metastasis despite combined androgen depravation with Lupron and Casodex. His last bone scan done on 09/18/2012 which showed progression of disease.  Current therapy:  Zytiga a total of 1000 mg started in August of 2014 for the PSA of 32.44. He is continued on Lupron at [facility] urology. His Xgeva to be given and the [place] in 06/2013.   Interim History:  Marcus [name] presents today for a followup visit with his wife. Since his last visit, he has no new complaints. He continues to tolerate Zytiga with any issues. He did not report any nausea or vomiting or abdominal pain. Did not report any urinary symptoms of hematuria dysuria or incontinence. He did not report any musculoskeletal pain arthralgias or myalgias. Did not report any lower extremity swelling. He is continued to perform activities of daily living without any hindrance or decline. He has not reported any neurological symptoms including headaches blurred vision or seizures. He has not reported any chest pain or difficulty breathing. Has not reported any cough or hemoptysis or wheezing. Has not reported any major changes in his bowel habits. He did not report any skin changes or petechiae. Has not reported any lymphadenopathy. He has not reported any pathological fractures or hospitalizations. Rest of his review of system is unremarkable.  Medications: I have reviewed the patient's current  medications.  Unchanged by my review. Current Outpatient Prescriptions  Medication Sig Dispense Refill  . abiraterone Acetate (ZYTIGA) 250 MG tablet Take 4 tablets (1,000 mg total) by mouth daily. Take on an empty stomach 1 hour before or 2 hours after a meal  120 tablet  0  . cholecalciferol (VITAMIN D) 1000 UNITS tablet Take 1,000 Units by mouth daily.      Marland Kitchen lisinopril-hydrochlorothiazide (PRINZIDE,ZESTORETIC) 20-25 MG per tablet TAKE ONE TABLET BY MOUTH EVERY DAY  90 tablet  3   Current Facility-Administered Medications  Medication Dose Route Frequency Provider Last Rate Last Dose  . denosumab (XGEVA) injection 120 mg  120 mg Subcutaneous Once Wyatt Portela, MD         Allergies: No Known Allergies   Physical Exam: Blood pressure 149/99, pulse 69, temperature 98.4 F (36.9 C), temperature source Oral, resp. rate 18, height 6\' 2"  (1.88 m), weight 214 lb 1.6 oz (97.115 kg), SpO2 100.00%. ECOG: 0 General appearance: alert and awake not in any distress Head: Normocephalic, without obvious abnormality Neck: no adenopathy and No masses or thyromegaly. Lymph nodes: Cervical, supraclavicular, and axillary nodes normal. Heart:regular rate and rhythm, S1, S2. No murmurs rubs or gallops. Lung:chest clear, no wheezing, rales.  Abdomen: soft, non-tender, without masses or organomegaly no shifting dullness EXT:no erythema, induration, no edema noted. Neurological: No deficits.   Lab Results: Lab Results  Component Value Date   WBC 8.5 09/21/2013   HGB 12.1* 09/21/2013   HCT 37.0* 09/21/2013   MCV 88.0 09/21/2013   PLT 255 09/21/2013     Chemistry      Component Value Date/Time   NA 141  09/21/2013 1103   NA 139 05/25/2012 1522   K 4.3 09/21/2013 1103   K 4.2 05/25/2012 1522   CL 104 05/25/2012 1522   CO2 25 09/21/2013 1103   CO2 27 05/25/2012 1522   BUN 15.8 09/21/2013 1103   BUN 17 05/25/2012 1522   CREATININE 1.1 09/21/2013 1103   CREATININE 1.37* 05/25/2012 1522      Component Value Date/Time    CALCIUM 9.7 09/21/2013 1103   CALCIUM 11.0* 05/25/2012 1522   ALKPHOS 83 09/21/2013 1103   ALKPHOS 78 05/25/2012 1522   AST 17 09/21/2013 1103   AST 31 05/25/2012 1522   ALT 18 09/21/2013 1103   ALT 37 05/25/2012 1522   BILITOT 0.40 09/21/2013 1103   BILITOT 0.4 05/25/2012 1522        Results for Marcus [name] (MRN [id number]) as of 09/23/2013 10:01  Ref. Range 07/14/2013 10:48 08/18/2013 11:13 09/21/2013 11:03  PSA Latest Range: <=4.00 ng/mL 4.50 (H) 4.12 (H) 5.82 (H)        Impression and Plan:  68 year old gentleman with the following issues:  1. Castration resistant prostate cancer with metastatic disease to the bone. He has developed castration resistant disease and have been on [place] since August of 2014. His PSA has declined though from 32.44 in August of 2014 to the current level. Most recently he has slight increased up to 5.82. Overall, I feel he is continue to benefit from Zytiga despite the slow rise in his PSA. If his PSA develops a rapid rise, we will restage him and consider a different salvage therapy.  2. Androgen depravation: I recommended that he continues this and he will like to receive this here. His next injection will be in October of 2015.  3. Bone directed therapy: He is on Xgeva monthly which he will be receiving here at this time. He will continue calcium and vitamin D supplement as well as continuous education about osteonecrosis of the jaw. He has no issues continuing Xgeva for now.   4. Followup: Will be in about 4  weeks.  OYDXAJ,OINOM 9/3/201510:00 AM

## 2013-09-23 NOTE — Telephone Encounter (Signed)
gv adn printed appt sched and avs fo rpt for OCT pt wanted to move MD visit to 10.8

## 2013-09-29 ENCOUNTER — Telehealth: Payer: Self-pay | Admitting: *Deleted

## 2013-09-29 ENCOUNTER — Other Ambulatory Visit: Payer: Self-pay | Admitting: *Deleted

## 2013-09-29 DIAGNOSIS — C61 Malignant neoplasm of prostate: Secondary | ICD-10-CM

## 2013-09-29 MED ORDER — ABIRATERONE ACETATE 250 MG PO TABS: 1000.0000 mg | ORAL_TABLET | Freq: Every day | ORAL | Status: DC

## 2013-09-29 NOTE — Telephone Encounter (Signed)
[facility] faxed Zytiga refill request.  Request to provider's desk/in-basket for review.

## 2013-10-05 NOTE — Telephone Encounter (Signed)
RECEIVED A FAX FROM [facility] CONCERNING A CONFIRMATION OF PRESCRIPTION SHIPMENT FOR [place] ON 10/04/13.

## 2013-10-26 ENCOUNTER — Other Ambulatory Visit (HOSPITAL_BASED_OUTPATIENT_CLINIC_OR_DEPARTMENT_OTHER): Payer: Medicare Other

## 2013-10-26 DIAGNOSIS — C61 Malignant neoplasm of prostate: Secondary | ICD-10-CM

## 2013-10-26 LAB — COMPREHENSIVE METABOLIC PANEL (CC13)
ALBUMIN: 3.3 g/dL — AB (ref 3.5–5.0)
ALK PHOS: 90 U/L (ref 40–150)
ALT: 15 U/L (ref 0–55)
ANION GAP: 5 meq/L (ref 3–11)
AST: 13 U/L (ref 5–34)
BUN: 16.1 mg/dL (ref 7.0–26.0)
CALCIUM: 10 mg/dL (ref 8.4–10.4)
CO2: 27 meq/L (ref 22–29)
Chloride: 110 mEq/L — ABNORMAL HIGH (ref 98–109)
Creatinine: 1.1 mg/dL (ref 0.7–1.3)
GLUCOSE: 98 mg/dL (ref 70–140)
POTASSIUM: 4.1 meq/L (ref 3.5–5.1)
Sodium: 143 mEq/L (ref 136–145)
Total Bilirubin: 0.39 mg/dL (ref 0.20–1.20)
Total Protein: 6.8 g/dL (ref 6.4–8.3)

## 2013-10-26 LAB — CBC WITH DIFFERENTIAL/PLATELET
BASO%: 0.6 % (ref 0.0–2.0)
BASOS ABS: 0 10*3/uL (ref 0.0–0.1)
EOS ABS: 0.7 10*3/uL — AB (ref 0.0–0.5)
EOS%: 10.2 % — ABNORMAL HIGH (ref 0.0–7.0)
HCT: 35.9 % — ABNORMAL LOW (ref 38.4–49.9)
HEMOGLOBIN: 11.9 g/dL — AB (ref 13.0–17.1)
LYMPH%: 20.1 % (ref 14.0–49.0)
MCH: 29.1 pg (ref 27.2–33.4)
MCHC: 33.1 g/dL (ref 32.0–36.0)
MCV: 87.8 fL (ref 79.3–98.0)
MONO#: 0.5 10*3/uL (ref 0.1–0.9)
MONO%: 6.2 % (ref 0.0–14.0)
NEUT%: 62.9 % (ref 39.0–75.0)
NEUTROS ABS: 4.6 10*3/uL (ref 1.5–6.5)
PLATELETS: 259 10*3/uL (ref 140–400)
RBC: 4.09 10*6/uL — ABNORMAL LOW (ref 4.20–5.82)
RDW: 14.4 % (ref 11.0–14.6)
WBC: 7.3 10*3/uL (ref 4.0–10.3)
lymph#: 1.5 10*3/uL (ref 0.9–3.3)

## 2013-10-27 ENCOUNTER — Ambulatory Visit: Payer: Medicare Other | Admitting: Physician Assistant

## 2013-10-27 ENCOUNTER — Ambulatory Visit: Payer: Medicare Other

## 2013-10-27 LAB — PSA: PSA: 7.25 ng/mL — AB (ref <=4.00)

## 2013-10-28 ENCOUNTER — Ambulatory Visit: Payer: Medicare Other | Admitting: Physician Assistant

## 2013-10-28 ENCOUNTER — Ambulatory Visit (HOSPITAL_BASED_OUTPATIENT_CLINIC_OR_DEPARTMENT_OTHER): Payer: Medicare Other | Admitting: Physician Assistant

## 2013-10-28 ENCOUNTER — Telehealth: Payer: Self-pay | Admitting: Oncology

## 2013-10-28 ENCOUNTER — Ambulatory Visit: Payer: Medicare Other

## 2013-10-28 ENCOUNTER — Encounter: Payer: Self-pay | Admitting: Physician Assistant

## 2013-10-28 ENCOUNTER — Ambulatory Visit (HOSPITAL_BASED_OUTPATIENT_CLINIC_OR_DEPARTMENT_OTHER): Payer: Medicare Other

## 2013-10-28 VITALS — BP 150/76 | HR 68 | Temp 98.9°F | Resp 18 | Ht 74.0 in | Wt 213.8 lb

## 2013-10-28 DIAGNOSIS — Z23 Encounter for immunization: Secondary | ICD-10-CM

## 2013-10-28 DIAGNOSIS — C61 Malignant neoplasm of prostate: Secondary | ICD-10-CM

## 2013-10-28 DIAGNOSIS — E291 Testicular hypofunction: Secondary | ICD-10-CM

## 2013-10-28 DIAGNOSIS — Z7951 Long term (current) use of inhaled steroids: Secondary | ICD-10-CM

## 2013-10-28 DIAGNOSIS — C7951 Secondary malignant neoplasm of bone: Secondary | ICD-10-CM

## 2013-10-28 MED ORDER — INFLUENZA VAC SPLIT QUAD 0.5 ML IM SUSY
0.5000 mL | PREFILLED_SYRINGE | Freq: Once | INTRAMUSCULAR | Status: AC
  Administered 2013-10-28: 0.5 mL via INTRAMUSCULAR
  Filled 2013-10-28: qty 0.5

## 2013-10-28 MED ORDER — DENOSUMAB 120 MG/1.7ML ~~LOC~~ SOLN
120.0000 mg | Freq: Once | SUBCUTANEOUS | Status: AC
  Administered 2013-10-28: 120 mg via SUBCUTANEOUS
  Filled 2013-10-28: qty 1.7

## 2013-10-28 NOTE — Telephone Encounter (Signed)
gv adn printed appt scehda dn avs for pt for NOV.Marland KitchenMarland KitchenMarland Kitchen

## 2013-10-28 NOTE — Progress Notes (Signed)
Hematology and Oncology Follow Up Visit  Marcus [name] [id number] 1945/03/01 68 y.o. 10/28/2013 4:31 PM   Principle Diagnosis: 68 year old gentleman diagnosed with prostate cancer in 2005. 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   Prior Therapy:  He is status post prostatectomy and subsequently was treated with salvage radiation therapy in 2007.  In February of 2013 his PSA was up to 22.5 and was started on androgen deprivation.  He developed castration resistant disease with documented bony metastasis despite combined androgen depravation with Lupron and Casodex. His last bone scan done on 09/18/2012 which showed progression of disease.  Current therapy:  Zytiga a total of 1000 mg started in August of 2014 for the PSA of 32.44. He is continued on Lupron at [facility] urology. His Xgeva to be given and the [place] in 06/2013.   Interim History:  Marcus [name] presents today for a followup visit with his wife. Since his last visit, he has no new complaints. He continues to tolerate Zytiga with any issues. He did not report any nausea or vomiting or abdominal pain. Did not report any urinary symptoms of hematuria dysuria or incontinence. He did not report any musculoskeletal pain arthralgias or myalgias. Did not report any lower extremity swelling. He has continued to perform activities of daily living without any hindrance or decline. He has not reported any neurological symptoms including headaches blurred vision or seizures. He has not reported any chest pain or difficulty breathing. Has not reported any cough or hemoptysis or wheezing. Has not reported any major changes in his bowel habits. He did not report any skin changes or petechiae. Has not reported any lymphadenopathy. He has not reported any pathological fractures or hospitalizations. Remainder of his review of system is unremarkable. He is due for his Xgeva injection and his Lupron injection  today.  Medications: I have reviewed the patient's current medications.  Unchanged by my review. Current Outpatient Prescriptions  Medication Sig Dispense Refill  . abiraterone Acetate (ZYTIGA) 250 MG tablet Take 4 tablets (1,000 mg total) by mouth daily. Take on an empty stomach 1 hour before or 2 hours after a meal  120 tablet  1  . cholecalciferol (VITAMIN D) 1000 UNITS tablet Take 1,000 Units by mouth daily.      Marland Kitchen lisinopril-hydrochlorothiazide (PRINZIDE,ZESTORETIC) 20-25 MG per tablet TAKE ONE TABLET BY MOUTH EVERY DAY  90 tablet  3   No current facility-administered medications for this visit.     Allergies: No Known Allergies   Physical Exam: Blood pressure 150/76, pulse 68, temperature 98.9 F (37.2 C), temperature source Oral, resp. rate 18, height 6\' 2"  (1.88 m), weight 213 lb 12.8 oz (96.979 kg). ECOG: 0 General appearance: alert and awake not in any distress Head: Normocephalic, without obvious abnormality Neck: no adenopathy and No masses or thyromegaly. Lymph nodes: Cervical, supraclavicular, and axillary nodes normal. Heart:regular rate and rhythm, S1, S2. No murmurs rubs or gallops. Lung:chest clear, no wheezing, rales.  Abdomen: soft, non-tender, without masses or organomegaly no shifting dullness EXT:no erythema, induration, no edema noted. Neurological: No deficits.   Lab Results: Lab Results  Component Value Date   WBC 7.3 10/26/2013   HGB 11.9* 10/26/2013   HCT 35.9* 10/26/2013   MCV 87.8 10/26/2013   PLT 259 10/26/2013     Chemistry      Component Value Date/Time   NA 143 10/26/2013 1103   NA 139 05/25/2012 1522   K 4.1 10/26/2013 1103  K 4.2 05/25/2012 1522   CL 104 05/25/2012 1522   CO2 27 10/26/2013 1103   CO2 27 05/25/2012 1522   BUN 16.1 10/26/2013 1103   BUN 17 05/25/2012 1522   CREATININE 1.1 10/26/2013 1103   CREATININE 1.37* 05/25/2012 1522      Component Value Date/Time   CALCIUM 10.0 10/26/2013 1103   CALCIUM 11.0* 05/25/2012 1522   ALKPHOS 90  10/26/2013 1103   ALKPHOS 78 05/25/2012 1522   AST 13 10/26/2013 1103   AST 31 05/25/2012 1522   ALT 15 10/26/2013 1103   ALT 37 05/25/2012 1522   BILITOT 0.39 10/26/2013 1103   BILITOT 0.4 05/25/2012 1522        Results for Marcus [name] (MRN [id number]) as of 09/23/2013 10:01  Ref. Range 07/14/2013 10:48 08/18/2013 11:13 09/21/2013 11:03  PSA Latest Range: <=4.00 ng/mL 4.50 (H) 4.12 (H) 5.82 (H)        Impression and Plan:  68 year old gentleman with the following issues:  1. Castration resistant prostate cancer with metastatic disease to the bone. He has developed castration resistant disease and have been on [place] since August of 2014. His PSA has declined though from 32.44 in August of 2014 to the current level. Most recently he has slight increased up to 5.82. Overall, I feel he is continue to benefit from Zytiga despite the slow rise in his PSA. If his PSA develops a rapid rise, we will restage him and consider a different salvage therapy.  2. Androgen depravation: Patient prefers to wait until his follow up appointment next month with Dr. Alen Blew to discuss continuing with Lupron injections.  3. Bone directed therapy: He is on Xgeva monthly which he will be receiving here at this time. He will continue calcium and vitamin D supplement as well as continuous education about osteonecrosis of the jaw. Delton See given today and will continue monthly.  4. Followup: Will be in about 4  weeks.  Marcus [name] E, PA-C 10/8/20154:31 PM

## 2013-10-31 NOTE — Patient Instructions (Signed)
Continue Zytiga at the current dose Follow up in 1 month

## 2013-11-03 ENCOUNTER — Ambulatory Visit
Admission: RE | Admit: 2013-11-03 | Discharge: 2013-11-03 | Disposition: A | Discharge status: Home / Self Care | Payer: Medicare Other | Source: Ambulatory Visit | Attending: Family Medicine | Admitting: Family Medicine

## 2013-11-03 ENCOUNTER — Ambulatory Visit (INDEPENDENT_AMBULATORY_CARE_PROVIDER_SITE_OTHER): Payer: Medicare Other | Admitting: Family Medicine

## 2013-11-03 ENCOUNTER — Encounter: Payer: Self-pay | Admitting: Family Medicine

## 2013-11-03 ENCOUNTER — Encounter: Payer: Self-pay | Admitting: *Deleted

## 2013-11-03 VITALS — BP 130/80 | HR 68 | Wt 211.0 lb

## 2013-11-03 DIAGNOSIS — J4521 Mild intermittent asthma with (acute) exacerbation: Secondary | ICD-10-CM

## 2013-11-03 DIAGNOSIS — J309 Allergic rhinitis, unspecified: Secondary | ICD-10-CM

## 2013-11-03 DIAGNOSIS — C61 Malignant neoplasm of prostate: Secondary | ICD-10-CM

## 2013-11-03 MED ORDER — MOMETASONE FURO-FORMOTEROL FUM 100-5 MCG/ACT IN AERO: 2.0000 | INHALATION_SPRAY | Freq: Two times a day (BID) | RESPIRATORY_TRACT | Status: DC

## 2013-11-03 NOTE — Progress Notes (Signed)
   Subjective:    Patient ID: Marcus [name], male    DOB: 1945-10-22, 68 y.o.   MRN: [id number]  HPI He complains of a month history of difficulty with wheezing that he notes mainly at night when he lies down. He is quite active during the day and mainly has difficulty with coughing if he pushes himself. He does not have a productive cough, fever, chills, shortness of breath. He has a previous history of allergies and asthma. He was given an inhaler in the past but did not have to continue on it. He has prostate cancer and presently is being treated for this.  Review of Systems     Objective:   Physical Exam alert and in no distress. Tympanic membranes and canals are normal. Throat is clear. Tonsils are normal. Neck is supple without adenopathy or thyromegaly. Cardiac exam shows a regular sinus rhythm without murmurs or gallops. Lungs are clear to auscultation.        Assessment & Plan:  Asthma with acute exacerbation, mild intermittent - Plan: DG Chest 2 View, mometasone-formoterol (DULERA) 100-5 MCG/ACT AERO  Prostate cancer  Allergic rhinitis, mild  he is to return here in 2 weeks for a recheck unless the x-ray indicates something needing to be treated sooner.

## 2013-11-03 NOTE — Progress Notes (Signed)
Faxed confirmation from [facility] that Zytiga was shipped 11/02/13 for next day delivery.

## 2013-11-23 ENCOUNTER — Other Ambulatory Visit (HOSPITAL_BASED_OUTPATIENT_CLINIC_OR_DEPARTMENT_OTHER): Payer: Medicare Other

## 2013-11-23 DIAGNOSIS — C61 Malignant neoplasm of prostate: Secondary | ICD-10-CM

## 2013-11-23 LAB — CBC WITH DIFFERENTIAL/PLATELET
BASO%: 0.9 % (ref 0.0–2.0)
BASOS ABS: 0.1 10*3/uL (ref 0.0–0.1)
EOS%: 11 % — AB (ref 0.0–7.0)
Eosinophils Absolute: 0.9 10*3/uL — ABNORMAL HIGH (ref 0.0–0.5)
HCT: 37.5 % — ABNORMAL LOW (ref 38.4–49.9)
HEMOGLOBIN: 12.1 g/dL — AB (ref 13.0–17.1)
LYMPH%: 16.2 % (ref 14.0–49.0)
MCH: 28.5 pg (ref 27.2–33.4)
MCHC: 32.2 g/dL (ref 32.0–36.0)
MCV: 88.4 fL (ref 79.3–98.0)
MONO#: 0.6 10*3/uL (ref 0.1–0.9)
MONO%: 7.5 % (ref 0.0–14.0)
NEUT#: 5 10*3/uL (ref 1.5–6.5)
NEUT%: 64.4 % (ref 39.0–75.0)
Platelets: 285 10*3/uL (ref 140–400)
RBC: 4.24 10*6/uL (ref 4.20–5.82)
RDW: 14.5 % (ref 11.0–14.6)
WBC: 7.7 10*3/uL (ref 4.0–10.3)
lymph#: 1.2 10*3/uL (ref 0.9–3.3)

## 2013-11-23 LAB — COMPREHENSIVE METABOLIC PANEL (CC13)
ALT: 19 U/L (ref 0–55)
AST: 20 U/L (ref 5–34)
Albumin: 3.4 g/dL — ABNORMAL LOW (ref 3.5–5.0)
Alkaline Phosphatase: 100 U/L (ref 40–150)
Anion Gap: 7 meq/L (ref 3–11)
BUN: 13.4 mg/dL (ref 7.0–26.0)
CO2: 27 meq/L (ref 22–29)
Calcium: 10.3 mg/dL (ref 8.4–10.4)
Chloride: 108 meq/L (ref 98–109)
Creatinine: 1.2 mg/dL (ref 0.7–1.3)
Glucose: 99 mg/dL (ref 70–140)
Potassium: 4.1 meq/L (ref 3.5–5.1)
Sodium: 142 meq/L (ref 136–145)
Total Bilirubin: 0.37 mg/dL (ref 0.20–1.20)
Total Protein: 7.1 g/dL (ref 6.4–8.3)

## 2013-11-24 LAB — PSA: PSA: 11.41 ng/mL — AB (ref <=4.00)

## 2013-11-25 ENCOUNTER — Telehealth: Payer: Self-pay | Admitting: Oncology

## 2013-11-25 ENCOUNTER — Ambulatory Visit (HOSPITAL_BASED_OUTPATIENT_CLINIC_OR_DEPARTMENT_OTHER): Payer: Medicare Other

## 2013-11-25 ENCOUNTER — Ambulatory Visit (HOSPITAL_BASED_OUTPATIENT_CLINIC_OR_DEPARTMENT_OTHER): Payer: Medicare Other | Admitting: Oncology

## 2013-11-25 VITALS — BP 143/90 | HR 66 | Temp 98.2°F | Resp 18 | Wt 213.4 lb

## 2013-11-25 DIAGNOSIS — C61 Malignant neoplasm of prostate: Secondary | ICD-10-CM

## 2013-11-25 DIAGNOSIS — C7951 Secondary malignant neoplasm of bone: Secondary | ICD-10-CM

## 2013-11-25 MED ORDER — DENOSUMAB 120 MG/1.7ML ~~LOC~~ SOLN
120.0000 mg | Freq: Once | SUBCUTANEOUS | Status: AC
  Administered 2013-11-25: 120 mg via SUBCUTANEOUS
  Filled 2013-11-25: qty 1.7

## 2013-11-25 MED ORDER — ENZALUTAMIDE 40 MG PO CAPS: 160.0000 mg | ORAL_CAPSULE | Freq: Every day | ORAL | Status: DC

## 2013-11-25 NOTE — Progress Notes (Signed)
Book, packet and educational materials given to patient and family on [facility]. Script given to ebony in managed care for assistance.

## 2013-11-25 NOTE — Telephone Encounter (Signed)
gv adn printed appt sched and avs for pt for Dec °

## 2013-11-25 NOTE — Patient Instructions (Signed)
Denosumab injection What is this medicine? DENOSUMAB (den oh sue mab) slows bone breakdown. Prolia is used to treat osteoporosis in women after menopause and in men. Xgeva is used to prevent bone fractures and other bone problems caused by cancer bone metastases. Xgeva is also used to treat giant cell tumor of the bone. This medicine may be used for other purposes; ask your health care provider or pharmacist if you have questions. COMMON BRAND NAME(S): Prolia, XGEVA What should I tell my health care provider before I take this medicine? They need to know if you have any of these conditions: -dental disease -eczema -infection or history of infections -kidney disease or on dialysis -low blood calcium or vitamin D -malabsorption syndrome -scheduled to have surgery or tooth extraction -taking medicine that contains denosumab -thyroid or parathyroid disease -an unusual reaction to denosumab, other medicines, foods, dyes, or preservatives -pregnant or trying to get pregnant -breast-feeding How should I use this medicine? This medicine is for injection under the skin. It is given by a health care professional in a hospital or clinic setting. If you are getting Prolia, a special MedGuide will be given to you by the pharmacist with each prescription and refill. Be sure to read this information carefully each time. For Prolia, talk to your pediatrician regarding the use of this medicine in children. Special care may be needed. For Xgeva, talk to your pediatrician regarding the use of this medicine in children. While this drug may be prescribed for children as young as 13 years for selected conditions, precautions do apply. Overdosage: If you think you've taken too much of this medicine contact a poison control center or emergency room at once. Overdosage: If you think you have taken too much of this medicine contact a poison control center or emergency room at once. NOTE: This medicine is only for  you. Do not share this medicine with others. What if I miss a dose? It is important not to miss your dose. Call your doctor or health care professional if you are unable to keep an appointment. What may interact with this medicine? Do not take this medicine with any of the following medications: -other medicines containing denosumab This medicine may also interact with the following medications: -medicines that suppress the immune system -medicines that treat cancer -steroid medicines like prednisone or cortisone This list may not describe all possible interactions. Give your health care provider a list of all the medicines, herbs, non-prescription drugs, or dietary supplements you use. Also tell them if you smoke, drink alcohol, or use illegal drugs. Some items may interact with your medicine. What should I watch for while using this medicine? Visit your doctor or health care professional for regular checks on your progress. Your doctor or health care professional may order blood tests and other tests to see how you are doing. Call your doctor or health care professional if you get a cold or other infection while receiving this medicine. Do not treat yourself. This medicine may decrease your body's ability to fight infection. You should make sure you get enough calcium and vitamin D while you are taking this medicine, unless your doctor tells you not to. Discuss the foods you eat and the vitamins you take with your health care professional. See your dentist regularly. Brush and floss your teeth as directed. Before you have any dental work done, tell your dentist you are receiving this medicine. Do not become pregnant while taking this medicine or for 5 months after stopping   it. Women should inform their doctor if they wish to become pregnant or think they might be pregnant. There is a potential for serious side effects to an unborn child. Talk to your health care professional or pharmacist for more  information. What side effects may I notice from receiving this medicine? Side effects that you should report to your doctor or health care professional as soon as possible: -allergic reactions like skin rash, itching or hives, swelling of the face, lips, or tongue -breathing problems -chest pain -fast, irregular heartbeat -feeling faint or lightheaded, falls -fever, chills, or any other sign of infection -muscle spasms, tightening, or twitches -numbness or tingling -skin blisters or bumps, or is dry, peels, or red -slow healing or unexplained pain in the mouth or jaw -unusual bleeding or bruising Side effects that usually do not require medical attention (Report these to your doctor or health care professional if they continue or are bothersome.): -muscle pain -stomach upset, gas This list may not describe all possible side effects. Call your doctor for medical advice about side effects. You may report side effects to FDA at 1-800-FDA-1088. Where should I keep my medicine? This medicine is only given in a clinic, doctor's office, or other health care setting and will not be stored at home. NOTE: This sheet is a summary. It may not cover all possible information. If you have questions about this medicine, talk to your doctor, pharmacist, or health care provider.  2015, [employer]vier/Gold Standard. (2011-07-08 12:37:47)  

## 2013-11-25 NOTE — Progress Notes (Signed)
Hematology and Oncology Follow Up Visit  Marcus [name] [id number] 1945-10-03 68 y.o. 11/25/2013 1:24 PM   Principle Diagnosis: 68 year old gentleman diagnosed with prostate cancer in 2005. 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   Prior Therapy:  He is status post prostatectomy and subsequently was treated with salvage radiation therapy in 2007.  In February of 2013 his Marcus [name] was up to 22.5 and was started on androgen deprivation.  He developed castration resistant disease with documented bony metastasis despite combined androgen depravation with Lupron and Casodex. His last bone scan done on 09/18/2012 which showed progression of disease.  Current therapy:  Zytiga a total of 1000 mg started in August of 2014 for the Marcus [name] of 32.44. He is continued on Lupron at [facility] urology. His Xgeva to be given and the [place] in 06/2013.   Interim History:  Mr. Marcus [name] presents today for a followup visit with his daughter. Since his last visit, he has no new complaints. He continues to tolerate Zytiga with any issues.  He continues to have excellent performance status and quality of life. He did not report any nausea or vomiting or abdominal pain. Did not report any urinary symptoms of hematuria dysuria or incontinence. He did not report any musculoskeletal pain arthralgias or myalgias. Did not report any lower extremity swelling. He is continued to perform activities of daily living without any hindrance or decline. He has not reported any neurological symptoms including headaches blurred vision or seizures. He has not reported any chest pain or difficulty breathing. Has not reported any cough or hemoptysis or wheezing. Has not reported any major changes in his bowel habits. He did not report any skin changes or petechiae. Has not reported any lymphadenopathy. He has not reported any pathological fractures or hospitalizations. Rest of his review of system is  unremarkable.  Medications: I have reviewed the patient's current medications.  Unchanged by my review. Current Outpatient Prescriptions  Medication Sig Dispense Refill  . cholecalciferol (VITAMIN D) 1000 UNITS tablet Take 1,000 Units by mouth daily.    Marland Kitchen lisinopril-hydrochlorothiazide (PRINZIDE,ZESTORETIC) 20-25 MG per tablet TAKE ONE TABLET BY MOUTH EVERY DAY 90 tablet 3  . mometasone-formoterol (DULERA) 100-5 MCG/ACT AERO Inhale 2 puffs into the lungs 2 (two) times daily. 1 Inhaler 11  . enzalutamide (XTANDI) 40 MG capsule Take 4 capsules (160 mg total) by mouth daily. 120 capsule 0   No current facility-administered medications for this visit.     Allergies: No Known Allergies   Physical Exam: Blood pressure 143/90, pulse 66, temperature 98.2 F (36.8 C), temperature source Oral, resp. rate 18, weight 213 lb 6 oz (96.786 kg), SpO2 98 %. ECOG: 0 General appearance: alert and awake not in any distress Head: Normocephalic, without obvious abnormality Neck: no adenopathy and No masses or thyromegaly. Lymph nodes: Cervical, supraclavicular, and axillary nodes normal. Heart:regular rate and rhythm, S1, S2. No murmurs rubs or gallops. Lung:chest clear, no wheezing, rales.  Abdomen: soft, non-tender, without masses or organomegaly no shifting dullness EXT:no erythema, induration, no edema noted. Neurological: No deficits.   Lab Results: Lab Results  Component Value Date   WBC 7.7 11/23/2013   HGB 12.1* 11/23/2013   HCT 37.5* 11/23/2013   MCV 88.4 11/23/2013   PLT 285 11/23/2013     Chemistry      Component Value Date/Time   NA 142 11/23/2013 1118   NA 139 05/25/2012 1522   K 4.1 11/23/2013 1118   K 4.2 05/25/2012  1522   CL 104 05/25/2012 1522   CO2 27 11/23/2013 1118   CO2 27 05/25/2012 1522   BUN 13.4 11/23/2013 1118   BUN 17 05/25/2012 1522   CREATININE 1.2 11/23/2013 1118   CREATININE 1.37* 05/25/2012 1522      Component Value Date/Time   CALCIUM 10.3  11/23/2013 1118   CALCIUM 11.0* 05/25/2012 1522   ALKPHOS 100 11/23/2013 1118   ALKPHOS 78 05/25/2012 1522   AST 20 11/23/2013 1118   AST 31 05/25/2012 1522   ALT 19 11/23/2013 1118   ALT 37 05/25/2012 1522   BILITOT 0.37 11/23/2013 1118   BILITOT 0.4 05/25/2012 1522        Results for CAILEB, RHUE (MRN [id number]) as of 11/25/2013 13:25  Ref. Range 09/21/2013 11:03 10/26/2013 11:03 11/23/2013 11:18  Marcus [name] Latest Range: <=4.00 ng/mL 5.82 (H) 7.25 (H) 11.41 (H)      Impression and Plan:  68 year old gentleman with the following issues:  1. Castration resistant prostate cancer with metastatic disease to the bone. He has developed castration resistant disease and have been on [place] since August of 2014. His Marcus [name] has declined though from 32.44 in August of 2014 to the current level. His Marcus [name] had been rising recently and the most recent one on 11/23/2013 was 11.4. His doubling time is close to 2 months indicating the need for change of therapy. Options of treatments were discussed today including chemotherapy versus Xtandi. Risks and benefits were discussed and he is agreeable to proceed with Xtandi. Complications that include fatigue, tiredness, lower extremity edema and rarely seizures we discussed today..  2. Androgen depravation: I will check testosterone level before the next visit and we'll initiate Lupron if needed to.  3. Bone directed therapy: He is on Xgeva monthly which he will be receiving here at this time. He will continue calcium and vitamin D supplement as well as continuous education about osteonecrosis of the jaw. He has no issues continuing Xgeva for now.   4. Followup: Will be in about 4  weeks.  Marcus [name] 11/5/20151:24 PM

## 2013-11-26 ENCOUNTER — Encounter: Payer: Self-pay | Admitting: Oncology

## 2013-11-26 NOTE — Progress Notes (Signed)
[facility], [id number], approved xtandi from 11/25/13-11/26/14

## 2013-12-02 ENCOUNTER — Encounter: Payer: Self-pay | Admitting: *Deleted

## 2013-12-02 NOTE — Progress Notes (Signed)
RECEIVED A FAX FROM [facility] CONCERNING A CONFIRMATION OF FACSIMILE RECEIPT FOR PT. REFERRAL. 

## 2013-12-03 ENCOUNTER — Encounter: Payer: Self-pay | Admitting: *Deleted

## 2013-12-03 NOTE — Progress Notes (Signed)
RECEIVED A FAX FROM [facility] CONCERNING A CONFIRMATION OF PRESCRIPTION SHIPMENT FOR XTANDI ON 12/02/13.

## 2013-12-27 ENCOUNTER — Encounter (HOSPITAL_COMMUNITY)
Admission: RE | Admit: 2013-12-27 | Discharge: 2013-12-27 | Disposition: A | Discharge status: Home / Self Care | Payer: Medicare Other | Source: Ambulatory Visit | Attending: Oncology | Admitting: Oncology

## 2013-12-27 ENCOUNTER — Other Ambulatory Visit (HOSPITAL_BASED_OUTPATIENT_CLINIC_OR_DEPARTMENT_OTHER): Payer: Medicare Other

## 2013-12-27 DIAGNOSIS — C7951 Secondary malignant neoplasm of bone: Secondary | ICD-10-CM

## 2013-12-27 DIAGNOSIS — C61 Malignant neoplasm of prostate: Secondary | ICD-10-CM | POA: Insufficient documentation

## 2013-12-27 LAB — CBC WITH DIFFERENTIAL/PLATELET
BASO%: 0.3 % (ref 0.0–2.0)
BASOS ABS: 0 10*3/uL (ref 0.0–0.1)
EOS%: 10 % — AB (ref 0.0–7.0)
Eosinophils Absolute: 0.7 10*3/uL — ABNORMAL HIGH (ref 0.0–0.5)
HCT: 36.2 % — ABNORMAL LOW (ref 38.4–49.9)
HGB: 11.7 g/dL — ABNORMAL LOW (ref 13.0–17.1)
LYMPH#: 0.9 10*3/uL (ref 0.9–3.3)
LYMPH%: 12.4 % — ABNORMAL LOW (ref 14.0–49.0)
MCH: 28.3 pg (ref 27.2–33.4)
MCHC: 32.3 g/dL (ref 32.0–36.0)
MCV: 87.7 fL (ref 79.3–98.0)
MONO#: 0.5 10*3/uL (ref 0.1–0.9)
MONO%: 7.3 % (ref 0.0–14.0)
NEUT#: 5.2 10*3/uL (ref 1.5–6.5)
NEUT%: 70 % (ref 39.0–75.0)
Platelets: 308 10*3/uL (ref 140–400)
RBC: 4.13 10*6/uL — ABNORMAL LOW (ref 4.20–5.82)
RDW: 14.2 % (ref 11.0–14.6)
WBC: 7.4 10*3/uL (ref 4.0–10.3)

## 2013-12-27 LAB — COMPREHENSIVE METABOLIC PANEL (CC13)
ALK PHOS: 108 U/L (ref 40–150)
ALT: 16 U/L (ref 0–55)
AST: 13 U/L (ref 5–34)
Albumin: 3.3 g/dL — ABNORMAL LOW (ref 3.5–5.0)
Anion Gap: 8 mEq/L (ref 3–11)
BUN: 13.9 mg/dL (ref 7.0–26.0)
CO2: 27 mEq/L (ref 22–29)
CREATININE: 1.1 mg/dL (ref 0.7–1.3)
Calcium: 10.4 mg/dL (ref 8.4–10.4)
Chloride: 104 mEq/L (ref 98–109)
EGFR: 82 mL/min/{1.73_m2} — ABNORMAL LOW (ref >90)
Glucose: 99 mg/dl (ref 70–140)
POTASSIUM: 4.4 meq/L (ref 3.5–5.1)
Sodium: 140 mEq/L (ref 136–145)
Total Bilirubin: 0.27 mg/dL (ref 0.20–1.20)
Total Protein: 7 g/dL (ref 6.4–8.3)

## 2013-12-27 MED ORDER — TECHNETIUM TC 99M MEDRONATE IV KIT
26.0000 | PACK | Freq: Once | INTRAVENOUS | Status: AC | PRN
  Administered 2013-12-27: 26 via INTRAVENOUS

## 2013-12-28 ENCOUNTER — Other Ambulatory Visit: Payer: Self-pay | Admitting: *Deleted

## 2013-12-28 DIAGNOSIS — C61 Malignant neoplasm of prostate: Secondary | ICD-10-CM

## 2013-12-28 LAB — TESTOSTERONE: Testosterone: 91 ng/dL — ABNORMAL LOW (ref 300–890)

## 2013-12-28 LAB — PSA: PSA: 12.87 ng/mL — ABNORMAL HIGH (ref <=4.00)

## 2013-12-28 MED ORDER — ENZALUTAMIDE 40 MG PO CAPS: 160.0000 mg | ORAL_CAPSULE | Freq: Every day | ORAL | Status: DC

## 2013-12-28 NOTE — Telephone Encounter (Signed)
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 

## 2013-12-28 NOTE — Addendum Note (Signed)
Addended by: Wyonia Hough on: 12/28/2013 01:46 PM   Modules accepted: Orders

## 2013-12-29 ENCOUNTER — Ambulatory Visit (HOSPITAL_BASED_OUTPATIENT_CLINIC_OR_DEPARTMENT_OTHER): Payer: Medicare Other | Admitting: Oncology

## 2013-12-29 ENCOUNTER — Telehealth: Payer: Self-pay | Admitting: Oncology

## 2013-12-29 ENCOUNTER — Ambulatory Visit (HOSPITAL_BASED_OUTPATIENT_CLINIC_OR_DEPARTMENT_OTHER): Payer: PRIVATE HEALTH INSURANCE

## 2013-12-29 VITALS — BP 129/81 | HR 74 | Temp 98.0°F | Resp 18 | Ht 74.0 in | Wt 211.0 lb

## 2013-12-29 DIAGNOSIS — C61 Malignant neoplasm of prostate: Secondary | ICD-10-CM

## 2013-12-29 DIAGNOSIS — C7951 Secondary malignant neoplasm of bone: Secondary | ICD-10-CM

## 2013-12-29 DIAGNOSIS — E291 Testicular hypofunction: Secondary | ICD-10-CM

## 2013-12-29 MED ORDER — DENOSUMAB 120 MG/1.7ML ~~LOC~~ SOLN
120.0000 mg | Freq: Once | SUBCUTANEOUS | Status: AC
  Administered 2013-12-29: 120 mg via SUBCUTANEOUS
  Filled 2013-12-29: qty 1.7

## 2013-12-29 NOTE — Telephone Encounter (Signed)
[facility] faxed confirmation of facsimile receipt for [facility] prescription referral.  [facility] will verify insurance and make delivery arrangements with patient.

## 2013-12-29 NOTE — Telephone Encounter (Signed)
Pt confirmed labs/ov/inj per 12/09 POF, gave pt AVS..... KJ

## 2013-12-29 NOTE — Progress Notes (Signed)
Hematology and Oncology Follow Up Visit  Marcus Marcus [name] Marcus [name] [id number] 1945/07/16 68 y.o. 12/29/2013 1:27 PM   Principle Diagnosis: 69 year old gentleman diagnosed with prostate cancer in 2005. 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   Prior Therapy:  He is status post prostatectomy and subsequently was treated with salvage radiation therapy in 2007.  In February of 2013 his PSA was up to 22.5 and was started on androgen deprivation.  He developed castration resistant disease with documented bony metastasis despite combined androgen depravation with Lupron and Casodex. His last bone scan done on 09/18/2012 which showed progression of disease. Zytiga a total of 1000 mg started in August of 2014 for the PSA of 32.44. He was discontinued in November 2015 due to a rise in his PSA   Current therapy:  Xtandi 160 mg daily started in November 2015. He is continued on Lupron at [facility] urology. His Xgeva to be given and the [place] in 06/2013.   Interim History:  Marcus Marcus [name] presents today for a followup visit. Since his last visit, he started Xtandi without any major complications. He did report some occasional stomach discomfort but spontaneously resolved he also had some thigh pain that takes Advil for.  He continues to have excellent performance status and quality of life. He did not report any nausea or vomiting or abdominal pain. Did not report any urinary symptoms of hematuria dysuria or incontinence. He did not report any musculoskeletal pain arthralgias or myalgias. Did not report any lower extremity swelling. He has not reported any neurological symptoms including headaches blurred vision or seizures. He has not reported any chest pain or difficulty breathing. Has not reported any cough or hemoptysis or wheezing. Has not reported any major changes in his bowel habits. He did not report any skin changes or petechiae. Has not reported any  lymphadenopathy. He has not reported any pathological fractures or hospitalizations. Rest of his review of system is unremarkable.  Medications: I have reviewed the patient's current medications.  Unchanged by my review. Current Outpatient Prescriptions  Medication Sig Dispense Refill  . cholecalciferol (VITAMIN D) 1000 UNITS tablet Take 1,000 Units by mouth daily.    . enzalutamide (XTANDI) 40 MG capsule Take 4 capsules (160 mg total) by mouth daily. 120 capsule 0  . lisinopril-hydrochlorothiazide (PRINZIDE,ZESTORETIC) 20-25 MG per tablet TAKE ONE TABLET BY MOUTH EVERY DAY 90 tablet 3  . mometasone-formoterol (DULERA) 100-5 MCG/ACT AERO Inhale 2 puffs into the lungs 2 (two) times daily. 1 Inhaler 11  . ZYTIGA 250 MG tablet      No current facility-administered medications for this visit.     Allergies: No Known Allergies   Physical Exam: Blood pressure 129/81, pulse 74, temperature 98 F (36.7 C), temperature source Oral, resp. rate 18, height 6\' 2"  (1.88 m), weight 211 lb (95.709 kg). ECOG: 0 General appearance: alert and awake not in any distress Head: Normocephalic, without obvious abnormality Neck: no adenopathy and No masses or thyromegaly. Lymph nodes: Cervical, supraclavicular, and axillary nodes normal. Heart:regular rate and rhythm, S1, S2. No murmurs rubs or gallops. Lung:chest clear, no wheezing, rales.  Abdomen: soft, non-tender, without masses or organomegaly no shifting dullness EXT:no erythema, induration, no edema noted. Neurological: No deficits.   Lab Results: Lab Results  Component Value Date   WBC 7.4 12/27/2013   HGB 11.7* 12/27/2013   HCT 36.2* 12/27/2013   MCV 87.7 12/27/2013   PLT 308 12/27/2013     Chemistry  Component Value Date/Time   NA 140 12/27/2013 1045   NA 139 05/25/2012 1522   K 4.4 12/27/2013 1045   K 4.2 05/25/2012 1522   CL 104 05/25/2012 1522   CO2 27 12/27/2013 1045   CO2 27 05/25/2012 1522   BUN 13.9 12/27/2013 1045    BUN 17 05/25/2012 1522   CREATININE 1.1 12/27/2013 1045   CREATININE 1.37* 05/25/2012 1522      Component Value Date/Time   CALCIUM 10.4 12/27/2013 1045   CALCIUM 11.0* 05/25/2012 1522   ALKPHOS 108 12/27/2013 1045   ALKPHOS 78 05/25/2012 1522   AST 13 12/27/2013 1045   AST 31 05/25/2012 1522   ALT 16 12/27/2013 1045   ALT 37 05/25/2012 1522   BILITOT 0.27 12/27/2013 1045   BILITOT 0.4 05/25/2012 1522       Results for Marcus Marcus [name], Marcus Marcus [name] (MRN [id number]) as of 12/29/2013 13:29  Ref. Range 10/26/2013 11:03 11/23/2013 11:18 12/27/2013 10:45  PSA Latest Range: <=4.00 ng/mL 7.25 (H) 11.41 (H) 12.87 (H)   EXAM: NUCLEAR MEDICINE WHOLE BODY BONE SCAN  TECHNIQUE: Whole body anterior and posterior images were obtained approximately 3 hours after intravenous injection of radiopharmaceutical.  RADIOPHARMACEUTICALS: 26 mCi Technetium-99 MDP  COMPARISON: 09/18/2012  FINDINGS: Overall improvement in the degree of osseous metastatic disease. The calvarial, bilateral rib, and thoracic vertebra demonstrate less apparent activity compared to 09/18/2012. The right acetabular, and posterior iliac lesions demonstrate stable activity. No new osseous lesions demonstrated.  IMPRESSION: Improvement in the osseous metastatic disease since 09/18/2012.     Impression and Plan:  68 year old gentleman with the following issues:  1. Castration resistant prostate cancer with metastatic disease to the bone. He has developed castration resistant disease and have been on [place] since August of 2014. His PSA has declined though from 32.44 in August of 2014 to the current level. His PSA rose again to 11.4 and was started on Xtandi. After one month of therapies PSA slightly up to 12. These results as well as the bone scan was discussed with the patient today and the plan is to continue on Xtandi for the time being he has no complications related to this medication.  2. Androgen depravation: I will  check testosterone level before the next visit and we'll initiate Lupron if needed to.  3. Bone directed therapy: He is on Xgeva monthly which he will be receiving here at this time. He will continue calcium and vitamin D supplement as well as continuous education about osteonecrosis of the jaw. He has no issues continuing Xgeva for now.   4. Followup: Will be in about 4  weeks.  Marcus Marcus [name] 12/9/20151:27 PM

## 2013-12-30 NOTE — Telephone Encounter (Signed)
Received confirmation of shipment from [facility] of xtandi. Ship date 12/29/13

## 2014-01-25 ENCOUNTER — Other Ambulatory Visit: Payer: Self-pay | Admitting: *Deleted

## 2014-01-25 NOTE — Telephone Encounter (Signed)
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

## 2014-01-26 ENCOUNTER — Other Ambulatory Visit: Payer: Self-pay | Admitting: *Deleted

## 2014-01-26 DIAGNOSIS — C61 Malignant neoplasm of prostate: Secondary | ICD-10-CM

## 2014-01-26 MED ORDER — ENZALUTAMIDE 40 MG PO CAPS: 160.0000 mg | ORAL_CAPSULE | Freq: Every day | ORAL | Status: DC

## 2014-01-27 NOTE — Telephone Encounter (Signed)
PRESCRIPTION HAS BEEN SENT TO [facility] TWICE. PT. WANTS TO RECEIVE HIS XTANDI FROM [facility].

## 2014-02-01 ENCOUNTER — Other Ambulatory Visit (HOSPITAL_BASED_OUTPATIENT_CLINIC_OR_DEPARTMENT_OTHER): Payer: 59

## 2014-02-01 DIAGNOSIS — C61 Malignant neoplasm of prostate: Secondary | ICD-10-CM

## 2014-02-01 DIAGNOSIS — C7951 Secondary malignant neoplasm of bone: Secondary | ICD-10-CM

## 2014-02-01 LAB — CBC WITH DIFFERENTIAL/PLATELET
BASO%: 0.5 % (ref 0.0–2.0)
Basophils Absolute: 0 10*3/uL (ref 0.0–0.1)
EOS%: 7.7 % — ABNORMAL HIGH (ref 0.0–7.0)
Eosinophils Absolute: 0.7 10*3/uL — ABNORMAL HIGH (ref 0.0–0.5)
HEMATOCRIT: 36.2 % — AB (ref 38.4–49.9)
HGB: 11.7 g/dL — ABNORMAL LOW (ref 13.0–17.1)
LYMPH#: 1.2 10*3/uL (ref 0.9–3.3)
LYMPH%: 12.3 % — ABNORMAL LOW (ref 14.0–49.0)
MCH: 28.1 pg (ref 27.2–33.4)
MCHC: 32.2 g/dL (ref 32.0–36.0)
MCV: 87.4 fL (ref 79.3–98.0)
MONO#: 0.8 10*3/uL (ref 0.1–0.9)
MONO%: 8.7 % (ref 0.0–14.0)
NEUT#: 6.7 10*3/uL — ABNORMAL HIGH (ref 1.5–6.5)
NEUT%: 70.8 % (ref 39.0–75.0)
Platelets: 359 10*3/uL (ref 140–400)
RBC: 4.14 10*6/uL — ABNORMAL LOW (ref 4.20–5.82)
RDW: 14.6 % (ref 11.0–14.6)
WBC: 9.4 10*3/uL (ref 4.0–10.3)

## 2014-02-01 LAB — COMPREHENSIVE METABOLIC PANEL (CC13)
ALK PHOS: 113 U/L (ref 40–150)
ALT: 12 U/L (ref 0–55)
AST: 14 U/L (ref 5–34)
Albumin: 3.3 g/dL — ABNORMAL LOW (ref 3.5–5.0)
Anion Gap: 5 mEq/L (ref 3–11)
BUN: 16.9 mg/dL (ref 7.0–26.0)
CO2: 27 mEq/L (ref 22–29)
CREATININE: 1.1 mg/dL (ref 0.7–1.3)
Calcium: 9.7 mg/dL (ref 8.4–10.4)
Chloride: 108 mEq/L (ref 98–109)
EGFR: 83 mL/min/{1.73_m2} — ABNORMAL LOW (ref >90)
Glucose: 96 mg/dl (ref 70–140)
Potassium: 4.3 mEq/L (ref 3.5–5.1)
SODIUM: 139 meq/L (ref 136–145)
TOTAL PROTEIN: 7.2 g/dL (ref 6.4–8.3)
Total Bilirubin: 0.3 mg/dL (ref 0.20–1.20)

## 2014-02-02 LAB — PSA: PSA: 17.97 ng/mL — AB (ref <=4.00)

## 2014-02-03 ENCOUNTER — Other Ambulatory Visit: Payer: Self-pay | Admitting: Physician Assistant

## 2014-02-03 ENCOUNTER — Ambulatory Visit (HOSPITAL_BASED_OUTPATIENT_CLINIC_OR_DEPARTMENT_OTHER): Payer: Medicare Other | Admitting: Physician Assistant

## 2014-02-03 ENCOUNTER — Ambulatory Visit (HOSPITAL_BASED_OUTPATIENT_CLINIC_OR_DEPARTMENT_OTHER): Payer: Medicare Other

## 2014-02-03 ENCOUNTER — Encounter: Payer: Self-pay | Admitting: Physician Assistant

## 2014-02-03 VITALS — BP 135/75 | HR 73 | Temp 98.1°F | Resp 18 | Ht 74.0 in | Wt 212.1 lb

## 2014-02-03 DIAGNOSIS — C7951 Secondary malignant neoplasm of bone: Secondary | ICD-10-CM | POA: Diagnosis not present

## 2014-02-03 DIAGNOSIS — E291 Testicular hypofunction: Secondary | ICD-10-CM

## 2014-02-03 DIAGNOSIS — C61 Malignant neoplasm of prostate: Secondary | ICD-10-CM

## 2014-02-03 DIAGNOSIS — M79651 Pain in right thigh: Secondary | ICD-10-CM | POA: Diagnosis not present

## 2014-02-03 MED ORDER — DENOSUMAB 120 MG/1.7ML ~~LOC~~ SOLN
120.0000 mg | Freq: Once | SUBCUTANEOUS | Status: AC
  Administered 2014-02-03: 120 mg via SUBCUTANEOUS
  Filled 2014-02-03: qty 1.7

## 2014-02-03 NOTE — Progress Notes (Signed)
Hematology and Oncology Follow Up Visit  Marcus [name] [id number] 02-12-1945 69 y.o. 02/03/2014 4:16 PM   Principle Diagnosis: 69 year old gentleman diagnosed with prostate cancer in 2005. 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   Prior Therapy:  He is status post prostatectomy and subsequently was treated with salvage radiation therapy in 2007.  In February of 2013 his PSA was up to 22.5 and was started on androgen deprivation.  He developed castration resistant disease with documented bony metastasis despite combined androgen depravation with Lupron and Casodex. His last bone scan done on 09/18/2012 which showed progression of disease. Zytiga a total of 1000 mg started in August of 2014 for the PSA of 32.44. He was discontinued in November 2015 due to a rise in his PSA   Current therapy:  Xtandi 160 mg daily started in November 2015. He is continued on Lupron at [facility] urology. His Xgeva to be given and the [place] in 06/2013.   Interim History:  Marcus [name] presents today for a followup visit. Since his last visit, he started Xtandi without any major complications. He reports some intermittent right thigh pain since starting the Xtandi. It is relieved with Aleve and will be absent for 2-3 days after taking the Aleve. He is having no other bone pain.   He continues to have excellent performance status and quality of life. He did not report any nausea or vomiting or abdominal pain. Did not report any urinary symptoms of hematuria dysuria or incontinence. He did not report any other musculoskeletal pain arthralgias or myalgias. Did not report any lower extremity swelling. He has not reported any neurological symptoms including headaches blurred vision or seizures. He has not reported any chest pain or difficulty breathing. Has not reported any cough or hemoptysis or wheezing. Has not reported any major changes in his bowel habits. He did not  report any skin changes or petechiae. Has not reported any lymphadenopathy. He has not reported any pathological fractures or hospitalizations. Remainder of his review of system is unremarkable.  Medications: I have reviewed the patient's current medications.  Marcus [name] has been discontinued patient currently taking Xtandi  Current Outpatient Prescriptions  Medication Sig Dispense Refill  . cholecalciferol (VITAMIN D) 1000 UNITS tablet Take 1,000 Units by mouth daily.    . enzalutamide (XTANDI) 40 MG capsule Take 4 capsules (160 mg total) by mouth daily. 120 capsule 0  . lisinopril-hydrochlorothiazide (PRINZIDE,ZESTORETIC) 20-25 MG per tablet TAKE ONE TABLET BY MOUTH EVERY DAY 90 tablet 3  . ZYTIGA 250 MG tablet     . mometasone-formoterol (DULERA) 100-5 MCG/ACT AERO Inhale 2 puffs into the lungs 2 (two) times daily. (Patient not taking: Reported on 02/03/2014) 1 Inhaler 11   No current facility-administered medications for this visit.     Allergies: No Known Allergies   Physical Exam: Blood pressure 135/75, pulse 73, temperature 98.1 F (36.7 C), temperature source Oral, resp. rate 18, height 6\' 2"  (1.88 m), weight 212 lb 1.6 oz (96.208 kg), SpO2 98 %. ECOG: 0 General appearance: alert and awake not in any distress Head: Normocephalic, without obvious abnormality Neck: no adenopathy and No masses or thyromegaly. Lymph nodes: Cervical, supraclavicular, and axillary nodes normal. Heart:regular rate and rhythm, S1, S2. No murmurs rubs or gallops. Lung:chest clear, no wheezing, rales.  Abdomen: soft, non-tender, without masses or organomegaly no shifting dullness EXT:no erythema, induration, no edema noted. Neurological: No deficits.   Lab Results: Lab Results  Component Value Date  WBC 9.4 02/01/2014   HGB 11.7* 02/01/2014   HCT 36.2* 02/01/2014   MCV 87.4 02/01/2014   PLT 359 02/01/2014     Chemistry      Component Value Date/Time   NA 139 02/01/2014 1118   NA 139 05/25/2012  1522   K 4.3 02/01/2014 1118   K 4.2 05/25/2012 1522   CL 104 05/25/2012 1522   CO2 27 02/01/2014 1118   CO2 27 05/25/2012 1522   BUN 16.9 02/01/2014 1118   BUN 17 05/25/2012 1522   CREATININE 1.1 02/01/2014 1118   CREATININE 1.37* 05/25/2012 1522      Component Value Date/Time   CALCIUM 9.7 02/01/2014 1118   CALCIUM 11.0* 05/25/2012 1522   ALKPHOS 113 02/01/2014 1118   ALKPHOS 78 05/25/2012 1522   AST 14 02/01/2014 1118   AST 31 05/25/2012 1522   ALT 12 02/01/2014 1118   ALT 37 05/25/2012 1522   BILITOT 0.30 02/01/2014 1118   BILITOT 0.4 05/25/2012 1522       Results for Marcus [name] (MRN [id number]) as of 12/29/2013 13:29  Ref. Range 10/26/2013 11:03 11/23/2013 11:18 12/27/2013 10:45  PSA Latest Range: <=4.00 ng/mL 7.25 (H) 11.41 (H) 12.87 (H)   EXAM: NUCLEAR MEDICINE WHOLE BODY BONE SCAN  TECHNIQUE: Whole body anterior and posterior images were obtained approximately 3 hours after intravenous injection of radiopharmaceutical.  RADIOPHARMACEUTICALS: 26 mCi Technetium-99 MDP  COMPARISON: 09/18/2012  FINDINGS: Overall improvement in the degree of osseous metastatic disease. The calvarial, bilateral rib, and thoracic vertebra demonstrate less apparent activity compared to 09/18/2012. The right acetabular, and posterior iliac lesions demonstrate stable activity. No new osseous lesions demonstrated.  IMPRESSION: Improvement in the osseous metastatic disease since 09/18/2012.     Impression and Plan:  69 year old gentleman with the following issues:  1. Castration resistant prostate cancer with metastatic disease to the bone. He has developed castration resistant disease and have been on [place] since August of 2014. His PSA has declined though from 32.44 in August of 2014 to the current level. His PSA rose again to 11.4 and was started on Xtandi. After one month of therapies PSA slightly up to 12. PSA increased again to 17.97. We will continue to monitor  the PSA along with his is not rapidly increasing we will continue the patient on Xtandi as he is tolerating it well and has no complications related to this medication.  Patient is using the [facility] outpatient pharmacy for his [facility].  2. Androgen depravation: I will check testosterone level before the next visit and we'll initiate Lupron if needed to.  3. Bone directed therapy: He is on Xgeva monthly which he will be receiving here at this time. He will continue calcium and vitamin D supplement as well as continuous education about osteonecrosis of the jaw. He has no issues continuing Xgeva for now.   4.Right thigh pain: This is intermittent in nature at this time and he will continue his intermittent use of Aleve. If this pain persists or worsens we will obtain imaging studies to evaluate it further.  4. Followup: Will be in about 4  weeks.  Carlton Adam 1/14/20164:16 PM

## 2014-02-03 NOTE — Patient Instructions (Signed)
Continue Xtandi at the current dose Follow up in one month 

## 2014-02-03 NOTE — Patient Instructions (Signed)
Denosumab injection What is this medicine? DENOSUMAB (den oh sue mab) slows bone breakdown. Prolia is used to treat osteoporosis in women after menopause and in men. Xgeva is used to prevent bone fractures and other bone problems caused by cancer bone metastases. Xgeva is also used to treat giant cell tumor of the bone. This medicine may be used for other purposes; ask your health care provider or pharmacist if you have questions. COMMON BRAND NAME(S): Prolia, XGEVA What should I tell my health care provider before I take this medicine? They need to know if you have any of these conditions: -dental disease -eczema -infection or history of infections -kidney disease or on dialysis -low blood calcium or vitamin D -malabsorption syndrome -scheduled to have surgery or tooth extraction -taking medicine that contains denosumab -thyroid or parathyroid disease -an unusual reaction to denosumab, other medicines, foods, dyes, or preservatives -pregnant or trying to get pregnant -breast-feeding How should I use this medicine? This medicine is for injection under the skin. It is given by a health care professional in a hospital or clinic setting. If you are getting Prolia, a special MedGuide will be given to you by the pharmacist with each prescription and refill. Be sure to read this information carefully each time. For Prolia, talk to your pediatrician regarding the use of this medicine in children. Special care may be needed. For Xgeva, talk to your pediatrician regarding the use of this medicine in children. While this drug may be prescribed for children as young as 13 years for selected conditions, precautions do apply. Overdosage: If you think you've taken too much of this medicine contact a poison control center or emergency room at once. Overdosage: If you think you have taken too much of this medicine contact a poison control center or emergency room at once. NOTE: This medicine is only for  you. Do not share this medicine with others. What if I miss a dose? It is important not to miss your dose. Call your doctor or health care professional if you are unable to keep an appointment. What may interact with this medicine? Do not take this medicine with any of the following medications: -other medicines containing denosumab This medicine may also interact with the following medications: -medicines that suppress the immune system -medicines that treat cancer -steroid medicines like prednisone or cortisone This list may not describe all possible interactions. Give your health care provider a list of all the medicines, herbs, non-prescription drugs, or dietary supplements you use. Also tell them if you smoke, drink alcohol, or use illegal drugs. Some items may interact with your medicine. What should I watch for while using this medicine? Visit your doctor or health care professional for regular checks on your progress. Your doctor or health care professional may order blood tests and other tests to see how you are doing. Call your doctor or health care professional if you get a cold or other infection while receiving this medicine. Do not treat yourself. This medicine may decrease your body's ability to fight infection. You should make sure you get enough calcium and vitamin D while you are taking this medicine, unless your doctor tells you not to. Discuss the foods you eat and the vitamins you take with your health care professional. See your dentist regularly. Brush and floss your teeth as directed. Before you have any dental work done, tell your dentist you are receiving this medicine. Do not become pregnant while taking this medicine or for 5 months after stopping   it. Women should inform their doctor if they wish to become pregnant or think they might be pregnant. There is a potential for serious side effects to an unborn child. Talk to your health care professional or pharmacist for more  information. What side effects may I notice from receiving this medicine? Side effects that you should report to your doctor or health care professional as soon as possible: -allergic reactions like skin rash, itching or hives, swelling of the face, lips, or tongue -breathing problems -chest pain -fast, irregular heartbeat -feeling faint or lightheaded, falls -fever, chills, or any other sign of infection -muscle spasms, tightening, or twitches -numbness or tingling -skin blisters or bumps, or is dry, peels, or red -slow healing or unexplained pain in the mouth or jaw -unusual bleeding or bruising Side effects that usually do not require medical attention (Report these to your doctor or health care professional if they continue or are bothersome.): -muscle pain -stomach upset, gas This list may not describe all possible side effects. Call your doctor for medical advice about side effects. You may report side effects to FDA at 1-800-FDA-1088. Where should I keep my medicine? This medicine is only given in a clinic, doctor's office, or other health care setting and will not be stored at home. NOTE: This sheet is a summary. It may not cover all possible information. If you have questions about this medicine, talk to your doctor, pharmacist, or health care provider.  2015, [employer]vier/Gold Standard. (2011-07-08 12:37:47)  

## 2014-03-02 ENCOUNTER — Telehealth: Payer: Self-pay | Admitting: *Deleted

## 2014-03-02 DIAGNOSIS — C61 Malignant neoplasm of prostate: Secondary | ICD-10-CM

## 2014-03-02 MED ORDER — ENZALUTAMIDE 40 MG PO CAPS: 160.0000 mg | ORAL_CAPSULE | Freq: Every day | ORAL | Status: DC

## 2014-03-02 NOTE — Telephone Encounter (Signed)
Received call from patient stating that he only has 3 days left of his current Xtandi prescription and needs a refill. He said he received a call from [facility] and that someone from Dr. Hazeline Junker office needed to call. Per last office visit note on 02/03/14, patient will continue Xtandi.  Called [facility] and they stated that [place] will now be filling patient's Xtandi. Called [facility] and spoke with Fanny Skates, pharmacist, and gave her the prescription refill. Let her know that patient will run out of medication in 3 days - she states she will mark prescription as urgent.  Called patient back to notify him of this - he is not home. Told patient's wife, Marcus [name],  that prescription has been called into [facility] and will be shipped to the home address soon. Wife appreciative of call.

## 2014-03-04 ENCOUNTER — Telehealth: Payer: Self-pay | Admitting: *Deleted

## 2014-03-04 NOTE — Telephone Encounter (Signed)
CONTACT INFORMATION REVIEWED.

## 2014-03-07 ENCOUNTER — Encounter: Payer: Self-pay | Admitting: *Deleted

## 2014-03-07 NOTE — Progress Notes (Signed)
Patient's wife calling to say they are having problems getting his xtandi and they want to know why. Contacted patient's specialty pharmacy and the problem is not insurance, but the co-pay of $441.97. They have turned this over to the managed care dept in their facility for co-pay assistance, explained this to patient and his wife.

## 2014-03-08 ENCOUNTER — Other Ambulatory Visit (HOSPITAL_BASED_OUTPATIENT_CLINIC_OR_DEPARTMENT_OTHER): Payer: Medicare Other

## 2014-03-08 DIAGNOSIS — C7951 Secondary malignant neoplasm of bone: Secondary | ICD-10-CM

## 2014-03-08 DIAGNOSIS — C61 Malignant neoplasm of prostate: Secondary | ICD-10-CM

## 2014-03-08 LAB — CBC WITH DIFFERENTIAL/PLATELET
BASO%: 0.7 % (ref 0.0–2.0)
Basophils Absolute: 0.1 10*3/uL (ref 0.0–0.1)
EOS%: 6.2 % (ref 0.0–7.0)
Eosinophils Absolute: 0.5 10*3/uL (ref 0.0–0.5)
HEMATOCRIT: 35 % — AB (ref 38.4–49.9)
HGB: 11 g/dL — ABNORMAL LOW (ref 13.0–17.1)
LYMPH%: 14.1 % (ref 14.0–49.0)
MCH: 26.9 pg — AB (ref 27.2–33.4)
MCHC: 31.3 g/dL — AB (ref 32.0–36.0)
MCV: 85.8 fL (ref 79.3–98.0)
MONO#: 0.8 10*3/uL (ref 0.1–0.9)
MONO%: 9.3 % (ref 0.0–14.0)
NEUT#: 5.8 10*3/uL (ref 1.5–6.5)
NEUT%: 69.7 % (ref 39.0–75.0)
PLATELETS: 387 10*3/uL (ref 140–400)
RBC: 4.08 10*6/uL — AB (ref 4.20–5.82)
RDW: 14.6 % (ref 11.0–14.6)
WBC: 8.3 10*3/uL (ref 4.0–10.3)
lymph#: 1.2 10*3/uL (ref 0.9–3.3)

## 2014-03-08 LAB — COMPREHENSIVE METABOLIC PANEL (CC13)
ALT: 10 U/L (ref 0–55)
ANION GAP: 9 meq/L (ref 3–11)
AST: 13 U/L (ref 5–34)
Albumin: 3.2 g/dL — ABNORMAL LOW (ref 3.5–5.0)
Alkaline Phosphatase: 112 U/L (ref 40–150)
BILIRUBIN TOTAL: 0.25 mg/dL (ref 0.20–1.20)
BUN: 14.7 mg/dL (ref 7.0–26.0)
CO2: 25 mEq/L (ref 22–29)
Calcium: 10.5 mg/dL — ABNORMAL HIGH (ref 8.4–10.4)
Chloride: 107 mEq/L (ref 98–109)
Creatinine: 1.1 mg/dL (ref 0.7–1.3)
EGFR: 83 mL/min/{1.73_m2} — AB (ref >90)
GLUCOSE: 105 mg/dL (ref 70–140)
Potassium: 4.3 mEq/L (ref 3.5–5.1)
Sodium: 141 mEq/L (ref 136–145)
TOTAL PROTEIN: 7.2 g/dL (ref 6.4–8.3)

## 2014-03-09 LAB — PSA: PSA: 27.28 ng/mL — AB (ref <=4.00)

## 2014-03-10 ENCOUNTER — Ambulatory Visit (HOSPITAL_BASED_OUTPATIENT_CLINIC_OR_DEPARTMENT_OTHER): Payer: Medicare Other | Admitting: Oncology

## 2014-03-10 ENCOUNTER — Ambulatory Visit (HOSPITAL_BASED_OUTPATIENT_CLINIC_OR_DEPARTMENT_OTHER): Payer: Medicare Other

## 2014-03-10 ENCOUNTER — Telehealth: Payer: Self-pay | Admitting: Oncology

## 2014-03-10 VITALS — BP 151/84 | HR 66 | Temp 98.5°F | Resp 18 | Ht 74.0 in | Wt 213.6 lb

## 2014-03-10 DIAGNOSIS — E291 Testicular hypofunction: Secondary | ICD-10-CM | POA: Diagnosis not present

## 2014-03-10 DIAGNOSIS — C61 Malignant neoplasm of prostate: Secondary | ICD-10-CM

## 2014-03-10 DIAGNOSIS — C7951 Secondary malignant neoplasm of bone: Secondary | ICD-10-CM

## 2014-03-10 MED ORDER — DENOSUMAB 120 MG/1.7ML ~~LOC~~ SOLN
120.0000 mg | Freq: Once | SUBCUTANEOUS | Status: AC
  Administered 2014-03-10: 120 mg via SUBCUTANEOUS
  Filled 2014-03-10: qty 1.7

## 2014-03-10 NOTE — Telephone Encounter (Signed)
Gave avs & calendar for March. °

## 2014-03-10 NOTE — Progress Notes (Signed)
Hematology and Oncology Follow Up Visit  Marcus [name] [id number] 11-13-1945 69 y.o. 03/10/2014 10:15 AM   Principle Diagnosis: 69 year old gentleman diagnosed with prostate cancer in 2005. 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   Prior Therapy:  He is status post prostatectomy and subsequently was treated with salvage radiation therapy in 2007.  In February of 2013 his PSA was up to 22.5 and was started on androgen deprivation.  He developed castration resistant disease with documented bony metastasis despite combined androgen depravation with Lupron and Casodex. His last bone scan done on 09/18/2012 which showed progression of disease. Zytiga a total of 1000 mg started in August of 2014 for the PSA of 32.44. He was discontinued in November 2015 due to a rise in his PSA   Current therapy:  Xtandi 160 mg daily started in November 2015. He is continued on Lupron at [facility] urology. His Xgeva to be given and the [place] in 06/2013.   Interim History:  Marcus [name] presents today for a followup visit. Since his last visit, he is not reporting any major issues. He continues to take Xtandi without any major complications. He reports some intermittent right thigh pain that is relieved with Aleve. This pain is intermittent in nature and have not been really constant. Has not reported any neurological deficits associated with it. He is having no other bone pain.   He continues to have excellent performance status and quality of life. He did not report any nausea or vomiting or abdominal pain. Did not report any urinary symptoms of hematuria dysuria or incontinence. He did not report any other musculoskeletal pain arthralgias or myalgias. Did not report any lower extremity swelling. He has not reported any neurological symptoms including headaches blurred vision or seizures. He has not reported any chest pain or difficulty breathing. Has not reported any  cough or hemoptysis or wheezing. Has not reported any major changes in his bowel habits. He did not report any skin changes or petechiae. Has not reported any lymphadenopathy. He has not reported any pathological fractures or hospitalizations. Remainder of his review of system is unremarkable.  Medications: I have reviewed the patient's current medications.  Current Outpatient Prescriptions  Medication Sig Dispense Refill  . cholecalciferol (VITAMIN D) 1000 UNITS tablet Take 1,000 Units by mouth daily.    . enzalutamide (XTANDI) 40 MG capsule Take 4 capsules (160 mg total) by mouth daily. 120 capsule 0  . lisinopril-hydrochlorothiazide (PRINZIDE,ZESTORETIC) 20-25 MG per tablet TAKE ONE TABLET BY MOUTH EVERY DAY 90 tablet 3  . mometasone-formoterol (DULERA) 100-5 MCG/ACT AERO Inhale 2 puffs into the lungs 2 (two) times daily. 1 Inhaler 11   No current facility-administered medications for this visit.     Allergies: No Known Allergies   Physical Exam: Blood pressure 151/84, pulse 66, temperature 98.5 F (36.9 C), temperature source Oral, resp. rate 18, height 6\' 2"  (1.88 m), weight 213 lb 9.6 oz (96.888 kg). ECOG: 0 General appearance: alert and awake not in any distress Head: Normocephalic, without obvious abnormality Neck: no adenopathy and No masses or thyromegaly. Lymph nodes: Cervical, supraclavicular, and axillary nodes normal. Heart:regular rate and rhythm, S1, S2. No murmurs rubs or gallops. Lung:chest clear, no wheezing, rales.  Abdomen: soft, non-tender, without masses or organomegaly no shifting dullness EXT:no erythema, induration, no edema noted. Neurological: No deficits.   Lab Results: Lab Results  Component Value Date   WBC 8.3 03/08/2014   HGB 11.0* 03/08/2014   HCT  35.0* 03/08/2014   MCV 85.8 03/08/2014   PLT 387 03/08/2014     Chemistry      Component Value Date/Time   NA 141 03/08/2014 1111   NA 139 05/25/2012 1522   K 4.3 03/08/2014 1111   K 4.2  05/25/2012 1522   CL 104 05/25/2012 1522   CO2 25 03/08/2014 1111   CO2 27 05/25/2012 1522   BUN 14.7 03/08/2014 1111   BUN 17 05/25/2012 1522   CREATININE 1.1 03/08/2014 1111   CREATININE 1.37* 05/25/2012 1522      Component Value Date/Time   CALCIUM 10.5* 03/08/2014 1111   CALCIUM 11.0* 05/25/2012 1522   ALKPHOS 112 03/08/2014 1111   ALKPHOS 78 05/25/2012 1522   AST 13 03/08/2014 1111   AST 31 05/25/2012 1522   ALT 10 03/08/2014 1111   ALT 37 05/25/2012 1522   BILITOT 0.25 03/08/2014 1111   BILITOT 0.4 05/25/2012 1522        Results for Marcus [name], Marcus [name] (MRN [id number]) as of 03/10/2014 09:58  Ref. Range 02/01/2014 11:17 03/08/2014 11:11  PSA Latest Range: <=4.00 ng/mL 17.97 (H) 27.28 (H)        Impression and Plan:  69 year old gentleman with the following issues:  1. Castration resistant prostate cancer with metastatic disease to the bone. He has developed castration resistant disease and have been on [place] since August of 2014. His PSA has declined though from 32.44 in August of 2014 to the current level. His PSA rose again to 11.4 and was started on Xtandi.   His PSA continues to rise despite being on xtandi and is up to 27. He is relatively asymptomatic at this time and his last bone scan in December 2015 showed actual disease control. The plan is to continue on the same dose and schedule and monitor his PSA closely. I discussed with him the different salvage agents after that and likely he will require chemotherapy. I discussed with him the logistics of administration of chemotherapy and for the time being we will attempt to hold off and monitor him closely on Xtandi.   2. Androgen depravation: His last testosterone is 91 in December 2015 and will be repeated in the future and administer Lupron as needed.  3. Bone directed therapy: He is on Xgeva monthly which he will be receiving here at this time. He will continue calcium and vitamin D supplement as well as  continuous education about osteonecrosis of the jaw. He has no issues continuing Xgeva for now.   4.Right thigh pain: This is intermittent in nature at this time and he will continue his intermittent use of Aleve. If this pain persists or worsens we will obtain imaging studies to evaluate it further.  5. Followup: Will be in about 4 to 5 weeks.  [facility] MD 2/18/201610:15 AM

## 2014-03-29 ENCOUNTER — Other Ambulatory Visit: Payer: Self-pay | Admitting: *Deleted

## 2014-03-29 DIAGNOSIS — C61 Malignant neoplasm of prostate: Secondary | ICD-10-CM

## 2014-03-29 MED ORDER — ENZALUTAMIDE 40 MG PO CAPS: 160.0000 mg | ORAL_CAPSULE | Freq: Every day | ORAL | Status: DC

## 2014-04-13 ENCOUNTER — Other Ambulatory Visit (HOSPITAL_BASED_OUTPATIENT_CLINIC_OR_DEPARTMENT_OTHER): Payer: Medicare Other

## 2014-04-13 DIAGNOSIS — C61 Malignant neoplasm of prostate: Secondary | ICD-10-CM

## 2014-04-13 DIAGNOSIS — C7951 Secondary malignant neoplasm of bone: Secondary | ICD-10-CM

## 2014-04-13 LAB — COMPREHENSIVE METABOLIC PANEL (CC13)
ALT: 11 U/L (ref 0–55)
AST: 12 U/L (ref 5–34)
Albumin: 3 g/dL — ABNORMAL LOW (ref 3.5–5.0)
Alkaline Phosphatase: 122 U/L (ref 40–150)
Anion Gap: 8 mEq/L (ref 3–11)
BILIRUBIN TOTAL: 0.34 mg/dL (ref 0.20–1.20)
BUN: 15.3 mg/dL (ref 7.0–26.0)
CO2: 25 mEq/L (ref 22–29)
CREATININE: 1 mg/dL (ref 0.7–1.3)
Calcium: 10.2 mg/dL (ref 8.4–10.4)
Chloride: 106 mEq/L (ref 98–109)
EGFR: >90 mL/min/{1.73_m2} (ref >90)
Glucose: 92 mg/dl (ref 70–140)
Potassium: 4.1 mEq/L (ref 3.5–5.1)
SODIUM: 138 meq/L (ref 136–145)
Total Protein: 7.5 g/dL (ref 6.4–8.3)

## 2014-04-13 LAB — CBC WITH DIFFERENTIAL/PLATELET
BASO%: 0.7 % (ref 0.0–2.0)
Basophils Absolute: 0.1 10*3/uL (ref 0.0–0.1)
EOS%: 3.3 % (ref 0.0–7.0)
Eosinophils Absolute: 0.3 10*3/uL (ref 0.0–0.5)
HEMATOCRIT: 33.7 % — AB (ref 38.4–49.9)
HEMOGLOBIN: 10.7 g/dL — AB (ref 13.0–17.1)
LYMPH%: 10.6 % — ABNORMAL LOW (ref 14.0–49.0)
MCH: 26.7 pg — ABNORMAL LOW (ref 27.2–33.4)
MCHC: 31.6 g/dL — ABNORMAL LOW (ref 32.0–36.0)
MCV: 84.6 fL (ref 79.3–98.0)
MONO#: 0.8 10*3/uL (ref 0.1–0.9)
MONO%: 8.2 % (ref 0.0–14.0)
NEUT#: 8 10*3/uL — ABNORMAL HIGH (ref 1.5–6.5)
NEUT%: 77.2 % — ABNORMAL HIGH (ref 39.0–75.0)
PLATELETS: 387 10*3/uL (ref 140–400)
RBC: 3.99 10*6/uL — ABNORMAL LOW (ref 4.20–5.82)
RDW: 15.6 % — ABNORMAL HIGH (ref 11.0–14.6)
WBC: 10.3 10*3/uL (ref 4.0–10.3)
lymph#: 1.1 10*3/uL (ref 0.9–3.3)

## 2014-04-14 LAB — PSA: PSA: 47.84 ng/mL — AB (ref <=4.00)

## 2014-04-15 ENCOUNTER — Telehealth: Payer: Self-pay | Admitting: Oncology

## 2014-04-15 ENCOUNTER — Ambulatory Visit (HOSPITAL_BASED_OUTPATIENT_CLINIC_OR_DEPARTMENT_OTHER): Payer: Medicare Other

## 2014-04-15 ENCOUNTER — Ambulatory Visit (HOSPITAL_BASED_OUTPATIENT_CLINIC_OR_DEPARTMENT_OTHER): Payer: Medicare Other | Admitting: Physician Assistant

## 2014-04-15 ENCOUNTER — Encounter: Payer: Self-pay | Admitting: Physician Assistant

## 2014-04-15 VITALS — BP 129/80 | HR 67 | Temp 98.1°F | Resp 18 | Ht 74.0 in | Wt 211.9 lb

## 2014-04-15 DIAGNOSIS — M79651 Pain in right thigh: Secondary | ICD-10-CM

## 2014-04-15 DIAGNOSIS — C7951 Secondary malignant neoplasm of bone: Secondary | ICD-10-CM | POA: Diagnosis not present

## 2014-04-15 DIAGNOSIS — C61 Malignant neoplasm of prostate: Secondary | ICD-10-CM

## 2014-04-15 MED ORDER — DENOSUMAB 120 MG/1.7ML ~~LOC~~ SOLN
120.0000 mg | Freq: Once | SUBCUTANEOUS | Status: AC
  Administered 2014-04-15: 120 mg via SUBCUTANEOUS
  Filled 2014-04-15: qty 1.7

## 2014-04-15 NOTE — Progress Notes (Signed)
Hematology and Oncology Follow Up Visit  Marcus [name] [id number] [birth year] 69 y.o. 04/15/2014 4:45 PM   Principle Diagnosis: 69 year old gentleman diagnosed with prostate cancer in 2005. 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   Prior Therapy:  He is status post prostatectomy and subsequently was treated with salvage radiation therapy in 2007.  In February of 2013 his PSA was up to 22.5 and was started on androgen deprivation.  He developed castration resistant disease with documented bony metastasis despite combined androgen depravation with Lupron and Casodex. His last bone scan done on 09/18/2012 which showed progression of disease. Zytiga a total of 1000 mg started in August of 2014 for the PSA of 32.44. He was discontinued in November 2015 due to a rise in his PSA   Current therapy:  Xtandi 160 mg daily started in November 2015. He is continued on Lupron at [facility] urology. His Xgeva to be given and the [employer] in 06/2013.   Interim History:  Marcus [name] presents today for a followup visit. Since his last visit, he is reporting some pain in his right thigh/femur region but no other major issues. He has been taking Aleve with good control. This pain is intermittent in nature and have not been really constant. He continues to take Xtandi without any major complications.  Has not reported any neurological deficits associated with it. He is having no other bone pain.   He continues to have excellent performance status and quality of life. He did not report any nausea or vomiting or abdominal pain. Did not report any urinary symptoms of hematuria dysuria or incontinence. He did not report any other musculoskeletal pain arthralgias or myalgias. Did not report any lower extremity swelling. He has not reported any neurological symptoms including headaches blurred vision or seizures. He has not reported any chest pain or difficulty breathing. Has  not reported any cough or hemoptysis or wheezing. Has not reported any major changes in his bowel habits. He did not report any skin changes or petechiae. Has not reported any lymphadenopathy. He has not reported any pathological fractures or hospitalizations. Remainder of his review of system is unremarkable.  Medications: I have reviewed the patient's current medications.  Current Outpatient Prescriptions  Medication Sig Dispense Refill  . cholecalciferol (VITAMIN D) 1000 UNITS tablet Take 1,000 Units by mouth daily.    . enzalutamide (XTANDI) 40 MG capsule Take 4 capsules (160 mg total) by mouth daily. 120 capsule 0  . lisinopril-hydrochlorothiazide (PRINZIDE,ZESTORETIC) 20-25 MG per tablet TAKE ONE TABLET BY MOUTH EVERY DAY 90 tablet 3  . mometasone-formoterol (DULERA) 100-5 MCG/ACT AERO Inhale 2 puffs into the lungs 2 (two) times daily. 1 Inhaler 11   No current facility-administered medications for this visit.     Allergies: No Known Allergies   Physical Exam: Blood pressure 129/80, pulse 67, temperature 98.1 F (36.7 C), temperature source Oral, resp. rate 18, height 6\' 2"  (1.88 m), weight 211 lb 14.4 oz (96.117 kg), SpO2 98 %. ECOG: 0 General appearance: alert and awake not in any distress Head: Normocephalic, without obvious abnormality Neck: no adenopathy and No masses or thyromegaly. Lymph nodes: Cervical, supraclavicular, and axillary nodes normal. Heart:regular rate and rhythm, S1, S2. No murmurs rubs or gallops. Lung:chest clear, no wheezing, rales.  Abdomen: soft, non-tender, without masses or organomegaly no shifting dullness EXT:no erythema, induration, no edema noted. Neurological: No deficits.   Lab Results: Lab Results  Component Value Date   WBC 10.3 04/13/2014  HGB 10.7* 04/13/2014   HCT 33.7* 04/13/2014   MCV 84.6 04/13/2014   PLT 387 04/13/2014     Chemistry      Component Value Date/Time   NA 138 04/13/2014 1048   NA 139 05/25/2012 1522   K 4.1  04/13/2014 1048   K 4.2 05/25/2012 1522   CL 104 05/25/2012 1522   CO2 25 04/13/2014 1048   CO2 27 05/25/2012 1522   BUN 15.3 04/13/2014 1048   BUN 17 05/25/2012 1522   CREATININE 1.0 04/13/2014 1048   CREATININE 1.37* 05/25/2012 1522      Component Value Date/Time   CALCIUM 10.2 04/13/2014 1048   CALCIUM 11.0* 05/25/2012 1522   ALKPHOS 122 04/13/2014 1048   ALKPHOS 78 05/25/2012 1522   AST 12 04/13/2014 1048   AST 31 05/25/2012 1522   ALT 11 04/13/2014 1048   ALT 37 05/25/2012 1522   BILITOT 0.34 04/13/2014 1048   BILITOT 0.4 05/25/2012 1522        Results for CJ, [name] (MRN [id number]) as of 03/10/2014 09:58  Ref. Range 02/01/2014 11:17 03/08/2014 11:11  PSA Latest Range: <=4.00 ng/mL 17.97 (H) 27.28 (H)        Impression and Plan:  69 year old gentleman with the following issues:  1. Castration resistant prostate cancer with metastatic disease to the bone. He has developed castration resistant disease and have been on [place] since August of 2014. His PSA has declined though from 32.44 in August of 2014 to the current level. His PSA rose again to 11.4 and was started on Xtandi.   His PSA continues to rise despite being on xtandi and is up to 47.84. He is relatively asymptomatic at this time and his last bone scan in December 2015 showed actual disease control. The plan is to continue on the same dose and schedule and monitor his PSA closely. I discussed with him the different salvage agents after that and likely he will require chemotherapy.Dr. Alen Blew discussed with him the logistics of administration of chemotherapy and for the time being we will attempt to hold off and monitor him closely on Xtandi.   2. Androgen depravation: His last testosterone is 91 in December 2015 and will be repeated in the future and administer Lupron as needed.  3. Bone directed therapy: He is on Xgeva monthly which he will be receiving here at this time. He will continue calcium and  vitamin D supplement as well as continuous education about osteonecrosis of the jaw. He has no issues continuing Xgeva for now.   4.Right thigh pain: This is intermittent in nature at this time and he will continue his intermittent use of Aleve. If this pain persists or worsens we will obtain imaging studies to evaluate it further.  5. Followup: Will be in about 4 to 5 weeks.  Awilda Metro E PA-C  3/25/20164:45 PM

## 2014-04-15 NOTE — Telephone Encounter (Signed)
Pt confirmed labs/ov/inj per 03/25 POF, gave pt AVS and calendar..... KJ

## 2014-04-19 ENCOUNTER — Other Ambulatory Visit: Payer: Self-pay | Admitting: *Deleted

## 2014-04-19 DIAGNOSIS — C61 Malignant neoplasm of prostate: Secondary | ICD-10-CM

## 2014-04-19 MED ORDER — ENZALUTAMIDE 40 MG PO CAPS: 160.0000 mg | ORAL_CAPSULE | Freq: Every day | ORAL | Status: DC

## 2014-04-20 NOTE — Patient Instructions (Signed)
Continue Xtandi at the current dose Follow up in one month 

## 2014-05-11 ENCOUNTER — Other Ambulatory Visit (HOSPITAL_BASED_OUTPATIENT_CLINIC_OR_DEPARTMENT_OTHER): Payer: Medicare Other

## 2014-05-11 DIAGNOSIS — C61 Malignant neoplasm of prostate: Secondary | ICD-10-CM | POA: Diagnosis not present

## 2014-05-11 LAB — CBC WITH DIFFERENTIAL/PLATELET
BASO%: 0.6 % (ref 0.0–2.0)
Basophils Absolute: 0.1 10*3/uL (ref 0.0–0.1)
EOS ABS: 0.2 10*3/uL (ref 0.0–0.5)
EOS%: 1.8 % (ref 0.0–7.0)
HCT: 29.8 % — ABNORMAL LOW (ref 38.4–49.9)
HEMOGLOBIN: 9.5 g/dL — AB (ref 13.0–17.1)
LYMPH%: 8.4 % — AB (ref 14.0–49.0)
MCH: 26.5 pg — ABNORMAL LOW (ref 27.2–33.4)
MCHC: 31.9 g/dL — ABNORMAL LOW (ref 32.0–36.0)
MCV: 82.8 fL (ref 79.3–98.0)
MONO#: 1.1 10*3/uL — ABNORMAL HIGH (ref 0.1–0.9)
MONO%: 9.3 % (ref 0.0–14.0)
NEUT%: 79.9 % — ABNORMAL HIGH (ref 39.0–75.0)
NEUTROS ABS: 9.4 10*3/uL — AB (ref 1.5–6.5)
Platelets: 365 10*3/uL (ref 140–400)
RBC: 3.6 10*6/uL — AB (ref 4.20–5.82)
RDW: 15.6 % — ABNORMAL HIGH (ref 11.0–14.6)
WBC: 11.8 10*3/uL — ABNORMAL HIGH (ref 4.0–10.3)
lymph#: 1 10*3/uL (ref 0.9–3.3)

## 2014-05-11 LAB — COMPREHENSIVE METABOLIC PANEL (CC13)
ALBUMIN: 2.7 g/dL — AB (ref 3.5–5.0)
ALT: 11 U/L (ref 0–55)
ANION GAP: 13 meq/L — AB (ref 3–11)
AST: 16 U/L (ref 5–34)
Alkaline Phosphatase: 122 U/L (ref 40–150)
BUN: 15.5 mg/dL (ref 7.0–26.0)
CO2: 22 mEq/L (ref 22–29)
Calcium: 9.5 mg/dL (ref 8.4–10.4)
Chloride: 106 mEq/L (ref 98–109)
Creatinine: 1 mg/dL (ref 0.7–1.3)
EGFR: 85 mL/min/{1.73_m2} — AB (ref >90)
Glucose: 111 mg/dl (ref 70–140)
POTASSIUM: 4 meq/L (ref 3.5–5.1)
SODIUM: 141 meq/L (ref 136–145)
TOTAL PROTEIN: 7.2 g/dL (ref 6.4–8.3)
Total Bilirubin: 0.25 mg/dL (ref 0.20–1.20)

## 2014-05-12 LAB — PSA: PSA: 81.5 ng/mL — ABNORMAL HIGH (ref <=4.00)

## 2014-05-13 ENCOUNTER — Ambulatory Visit (HOSPITAL_BASED_OUTPATIENT_CLINIC_OR_DEPARTMENT_OTHER): Payer: Medicare Other

## 2014-05-13 ENCOUNTER — Ambulatory Visit (HOSPITAL_BASED_OUTPATIENT_CLINIC_OR_DEPARTMENT_OTHER): Payer: Medicare Other | Admitting: Oncology

## 2014-05-13 VITALS — BP 152/87 | HR 71 | Temp 98.5°F | Resp 18 | Ht 74.0 in | Wt 209.1 lb

## 2014-05-13 DIAGNOSIS — M79651 Pain in right thigh: Secondary | ICD-10-CM | POA: Diagnosis not present

## 2014-05-13 DIAGNOSIS — C7951 Secondary malignant neoplasm of bone: Secondary | ICD-10-CM

## 2014-05-13 DIAGNOSIS — C61 Malignant neoplasm of prostate: Secondary | ICD-10-CM

## 2014-05-13 DIAGNOSIS — E291 Testicular hypofunction: Secondary | ICD-10-CM | POA: Diagnosis not present

## 2014-05-13 MED ORDER — DENOSUMAB 120 MG/1.7ML ~~LOC~~ SOLN
120.0000 mg | Freq: Once | SUBCUTANEOUS | Status: AC
  Administered 2014-05-13: 120 mg via SUBCUTANEOUS
  Filled 2014-05-13: qty 1.7

## 2014-05-13 NOTE — Progress Notes (Signed)
Hematology and Oncology Follow Up Visit  Marcus [name] [id number] April 03, 1945 69 y.o. 05/13/2014 4:12 PM   Principle Diagnosis: 69 year old gentleman diagnosed with prostate cancer in 2005. 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   Prior Therapy:  He is status post prostatectomy and subsequently was treated with salvage radiation therapy in 2007.  In February of 2013 his PSA was up to 22.5 and was started on androgen deprivation.  He developed castration resistant disease with documented bony metastasis despite combined androgen depravation with Lupron and Casodex. His last bone scan done on 09/18/2012 which showed progression of disease. Zytiga a total of 1000 mg started in August of 2014 for the PSA of 32.44. He was discontinued in November 2015 due to a rise in his PSA Xtandi 160 mg daily started in November 2015. Stopped on 05/13/2014 due to progression of disease.   Current therapy:  Under consideration for Xofigo vs. Chemotherapy. He is continued on Lupron at [facility] urology. His Xgeva to be given and the [place] since 06/2013.   Interim History:  [name] presents today for a followup visit with his family. Since his last visit, he is doing about the same. He is reporting some pain in his right thigh/femur region which was noted since the last visit. The pain is intermittent and not constant at this time. Associated with any neurological deficits.. He has been taking Aleve with good control. He continues to take Xtandi without any major complications.  Has not reported any neurological deficits associated with it. He is having no other bone pain. He continues to have excellent performance status and quality of life. He did not report any nausea or vomiting or abdominal pain. Did not report any urinary symptoms of hematuria dysuria or incontinence. He did not report any other musculoskeletal pain arthralgias or myalgias. Did not report  any lower extremity swelling. He has not reported any neurological symptoms including headaches blurred vision or seizures. He has not reported any chest pain or difficulty breathing. Has not reported any cough or hemoptysis or wheezing. Has not reported any major changes in his bowel habits. He did not report any skin changes or petechiae. Has not reported any lymphadenopathy. He has not reported any pathological fractures or hospitalizations. Remainder of his review of system is unremarkable.  Medications: I have reviewed the patient's current medications.  Current Outpatient Prescriptions  Medication Sig Dispense Refill  . cholecalciferol (VITAMIN D) 1000 UNITS tablet Take 1,000 Units by mouth daily.    . enzalutamide (XTANDI) 40 MG capsule Take 4 capsules (160 mg total) by mouth daily. 120 capsule 0  . lisinopril-hydrochlorothiazide (PRINZIDE,ZESTORETIC) 20-25 MG per tablet TAKE ONE TABLET BY MOUTH EVERY DAY 90 tablet 3  . mometasone-formoterol (DULERA) 100-5 MCG/ACT AERO Inhale 2 puffs into the lungs 2 (two) times daily. 1 Inhaler 11   No current facility-administered medications for this visit.   Facility-Administered Medications Ordered in Other Visits  Medication Dose Route Frequency Provider Last Rate Last Dose  . denosumab (XGEVA) injection 120 mg  120 mg Subcutaneous Once Adrena E Johnson, PA-C         Allergies: No Known Allergies   Physical Exam: Blood pressure 152/87, pulse 71, temperature 98.5 F (36.9 C), temperature source Oral, resp. rate 18, height 6\' 2"  (1.88 m), weight 209 lb 1.6 oz (94.847 kg). ECOG: 0 General appearance: alert and awake not in any distress Head: Normocephalic, without obvious abnormality Neck: no adenopathy and No masses  or thyromegaly. Lymph nodes: Cervical, supraclavicular, and axillary nodes normal. Heart:regular rate and rhythm, S1, S2. No murmurs rubs or gallops. Lung:chest clear, no wheezing, rales.  Abdomen: soft, non-tender, without  masses or organomegaly no shifting dullness EXT:no erythema, induration, no edema noted. Neurological: No deficits and his motor, sensory or deep tendon reflexes.   Lab Results: Lab Results  Component Value Date   WBC 11.8* 05/11/2014   HGB 9.5* 05/11/2014   HCT 29.8* 05/11/2014   MCV 82.8 05/11/2014   PLT 365 05/11/2014     Chemistry      Component Value Date/Time   NA 141 05/11/2014 0907   NA 139 05/25/2012 1522   K 4.0 05/11/2014 0907   K 4.2 05/25/2012 1522   CL 104 05/25/2012 1522   CO2 22 05/11/2014 0907   CO2 27 05/25/2012 1522   BUN 15.5 05/11/2014 0907   BUN 17 05/25/2012 1522   CREATININE 1.0 05/11/2014 0907   CREATININE 1.37* 05/25/2012 1522      Component Value Date/Time   CALCIUM 9.5 05/11/2014 0907   CALCIUM 11.0* 05/25/2012 1522   ALKPHOS 122 05/11/2014 0907   ALKPHOS 78 05/25/2012 1522   AST 16 05/11/2014 0907   AST 31 05/25/2012 1522   ALT 11 05/11/2014 0907   ALT 37 05/25/2012 1522   BILITOT 0.25 05/11/2014 0907   BILITOT 0.4 05/25/2012 1522         Results for [name], Marcus (MRN [id number]) as of 05/13/2014 16:30  Ref. Range 03/08/2014 11:11 04/13/2014 10:48 05/11/2014 09:07  PSA Latest Ref Range: <=4.00 ng/mL 27.28 (H) 47.84 (H) 81.50 (H)       Impression and Plan:  69 year old gentleman with the following issues:  1. Castration resistant prostate cancer with metastatic disease to the bone. He has developed castration resistant disease and have progressed on both Zytiga and most recently on xtandi. His most recent PSA was up to 81 from 47.8.  Options of treatments were discussed again extensively with the patient and his family. The first up in determining his best option is to restage him with a CT scan and a bone scan. If he has bone disease only, Xofigo would be a reasonable option for him. He does have intermittent bone pain and certainly would help with his pain as well as his overall cancer control. If she has visceral metastasis  then systemic chemotherapy would be his best option. Risks and benefits of systemic chemotherapy in the form of Taxotere were discussed. Complications include nausea, vomiting, myelosuppression, numbers were discussed. Patient is very hesitant to proceed with chemotherapy but certainly would consider it if he has visceral metastasis.  The plan is to schedule his CT scan and a bone scan in the near future as well as a referral to radiation oncology for an evaluation regarding Xofigo.   2. Androgen depravation: His last testosterone is 91 in December 2015 and will be repeated in the future and administer Lupron as needed.  3. Bone directed therapy: He is on Xgeva monthly which he will be receiving here at this time. He will continue calcium and vitamin D supplement as well as continuous education about osteonecrosis of the jaw. He has no issues continuing Xgeva for now.   4.Right thigh pain: This is intermittent in nature at this time and he will continue his intermittent use of Aleve. Palliative radiation therapy could be considered if he has an area of active uptake on his bone scan.  5. Followup: Will be in  about 4 weeks.  [facility] MD 4/22/20164:12 PM

## 2014-05-23 ENCOUNTER — Telehealth: Payer: Self-pay

## 2014-05-23 DIAGNOSIS — C61 Malignant neoplasm of prostate: Secondary | ICD-10-CM

## 2014-05-23 MED ORDER — OXYCODONE-ACETAMINOPHEN 5-325 MG PO TABS: 1.0000 | ORAL_TABLET | ORAL | Status: DC | PRN

## 2014-05-23 NOTE — Telephone Encounter (Signed)
Wife called pain is legs is getting worse. Aleve not working.

## 2014-05-23 NOTE — Telephone Encounter (Signed)
Called back, pain went from right leg and went left leg. Left leg moved to low back at time of call. No foot drop, no bowel or bladder incontinence. No numbness, no tingling. Deep in there can't tell if it is sore or hurts. Aleve taking 4 every 4 hours. S/w Selena Lesser NP and percocet prescribed. Pt aware to bring driver's license when pick it up.

## 2014-05-24 ENCOUNTER — Ambulatory Visit (HOSPITAL_COMMUNITY)
Admission: RE | Admit: 2014-05-24 | Discharge: 2014-05-24 | Disposition: A | Discharge status: Home / Self Care | Payer: Medicare Other | Source: Ambulatory Visit | Attending: Oncology | Admitting: Oncology

## 2014-05-24 ENCOUNTER — Telehealth: Payer: Self-pay | Admitting: *Deleted

## 2014-05-24 ENCOUNTER — Encounter (HOSPITAL_COMMUNITY)
Admission: RE | Admit: 2014-05-24 | Discharge: 2014-05-24 | Disposition: A | Discharge status: Home / Self Care | Payer: Medicare Other | Source: Ambulatory Visit | Attending: Oncology | Admitting: Oncology

## 2014-05-24 ENCOUNTER — Encounter (HOSPITAL_COMMUNITY): Payer: Self-pay

## 2014-05-24 DIAGNOSIS — C61 Malignant neoplasm of prostate: Secondary | ICD-10-CM | POA: Diagnosis not present

## 2014-05-24 DIAGNOSIS — C7951 Secondary malignant neoplasm of bone: Secondary | ICD-10-CM | POA: Insufficient documentation

## 2014-05-24 MED ORDER — IOHEXOL 300 MG/ML  SOLN
50.0000 mL | Freq: Once | INTRAMUSCULAR | Status: AC | PRN
  Administered 2014-05-24: 50 mL via ORAL

## 2014-05-24 MED ORDER — TECHNETIUM TC 99M MEDRONATE IV KIT
25.0000 | PACK | Freq: Once | INTRAVENOUS | Status: AC | PRN
  Administered 2014-05-24: 25 via INTRAVENOUS

## 2014-05-24 MED ORDER — IOHEXOL 300 MG/ML  SOLN
100.0000 mL | Freq: Once | INTRAMUSCULAR | Status: AC | PRN
  Administered 2014-05-24: 100 mL via INTRAVENOUS

## 2014-05-24 NOTE — Telephone Encounter (Signed)
PT.'S WIFE IS HERE TO PICK UP HER HUSBAND'S MEDICATION FOR PAIN. INFORMED PT.'S WIFE [facility] DOES NOT DISPENSE MEDICATIONS. THERE IS A PRESCRIPTION FOR PAIN MEDICATION READY TO BE PICKED UP AND TAKEN TO PT.'S PHARMACY TO BE FILLED. SHE VOICES UNDERSTANDING.

## 2014-05-25 ENCOUNTER — Encounter: Payer: Self-pay | Admitting: Radiation Oncology

## 2014-05-25 NOTE — Progress Notes (Signed)
GU Location of Tumor / Histology: castration resistant prostate cancer with metastatic disease to bone  If Prostate Cancer, Gleason Score is (4 + 3) and PSA is 81 (most recent).  Marcus [name] was diagnosed with prostate cancer in 2005.  Past/Anticipated interventions by urology, if any: prostatectomy  Past/Anticipated interventions by medical oncology, if any:  Prior Therapy:  He is status post prostatectomy and subsequently was treated with salvage radiation therapy in 2007.  In February of 2013 his PSA was up to 22.5 and was started on androgen deprivation.  He developed castration resistant disease with documented bony metastasis despite combined androgen depravation with Lupron and Casodex. His last bone scan done on 09/18/2012 which showed progression of disease.  Current therapy:  Zytiga a total of 1000 mg started in August of 2014 for the PSA of 32.44. He is continued on Lupron at [facility] urology. His Xgeva to be given and the [place] in 06/2013.   Weight changes, if any: no  Bowel/Bladder complaints, if any: Denies   Nausea/Vomiting, if any: Denies  Pain issues, if any:  Intermittent right thigh/femur pain  SAFETY ISSUES:  Prior radiation? yes  Pacemaker/ICD? no  Possible current pregnancy? no  Is the patient on methotrexate? no  Current Complaints / other details:  69 year old male. Referred by Dr. Alen Blew for consideration of [name] (testosterone in December 2015 was 19) and palliative radiation therapy to right thigh.

## 2014-05-26 ENCOUNTER — Ambulatory Visit
Admission: RE | Admit: 2014-05-26 | Discharge: 2014-05-26 | Disposition: A | Discharge status: Home / Self Care | Payer: Medicare Other | Source: Ambulatory Visit | Attending: Radiation Oncology | Admitting: Radiation Oncology

## 2014-05-26 ENCOUNTER — Encounter: Payer: Self-pay | Admitting: Radiation Oncology

## 2014-05-26 VITALS — BP 114/75 | HR 77 | Resp 16 | Ht 74.0 in | Wt 205.0 lb

## 2014-05-26 DIAGNOSIS — C61 Malignant neoplasm of prostate: Secondary | ICD-10-CM

## 2014-05-26 DIAGNOSIS — C7951 Secondary malignant neoplasm of bone: Secondary | ICD-10-CM

## 2014-05-26 MED ORDER — OXYCODONE-ACETAMINOPHEN 5-325 MG PO TABS: 1.0000 | ORAL_TABLET | Freq: Four times a day (QID) | ORAL | Status: DC | PRN

## 2014-05-26 MED ORDER — XOFIGO 27 MCCI/ML IV SOLN: 50.0000 | INTRAVENOUS | Status: DC

## 2014-05-26 NOTE — Progress Notes (Signed)
  Radiation Oncology         [phone] ________________________________  Name: Marcus [name] MRN: [id number]  Date: 05/26/2014  DOB: [birth year]  Xofigo Treatment Planning Note:  Diagnosis:  Castration resistant prostate cancer with painful bone involvement  Narrative: Mr.Marcus [name] is a patient who has been diagnosed with castration resistant prostate cancer with painful bone involvement.  His most recent blood counts show that he remains a good candidate to proceed with Ra-223.  The patient is going to receive Xofigo for his treatment.   Radiation Treatment Planning:  The prescribed radiation activity will be 50 kBq per kg per infusions. The plan is to offer a total of 6 IV administrations of this agent, assuming the blood counts are adequate prior to each administration, with each infusion done at 4 week intervals.  This will be done as an IV administration in the nuclear medicine department, with care to undertake all radiation protection precautions as recommended.   ________________________________  Sheral Apley. Tammi Klippel, M.D.

## 2014-05-26 NOTE — Progress Notes (Signed)
Radiation Oncology         [phone] ________________________________  Initial outpatient Consultation  Name: Marcus [name] MRN: [id number]  Date: 05/26/2014  DOB: [birth year]  IW:[email or web address],JASN Juanda Crumble, MD  Denita Lung, MD   REFERRING PHYSICIAN: Denita Lung, MD  DIAGNOSIS: 69 y/o gentlemen with painful bone metastasis from castration resistant prostate cancer.    ICD-9-CM ICD-10-CM   1. Prostate cancer 185 C61 XTANDI 40 MG capsule     oxyCODONE-acetaminophen (PERCOCET/ROXICET) 5-325 MG per tablet     XOFIGO 27 MCCI/ML SOLN  2. Bone metastases 198.5 C79.51 XTANDI 40 MG capsule     oxyCODONE-acetaminophen (PERCOCET/ROXICET) 5-325 MG per tablet     XOFIGO 27 MCCI/ML SOLN    HISTORY OF PRESENT ILLNESS:   Marcus [name] is a nice 69 y/o gentlemen who underwent radical prostatectomy 06/26/04. He required salvage prostate fossa. He had radiotherapy under my care from 11/26/04 -01/18/05. In February of 2013 his PSA was up to 22.5 and was started on androgen deprivation. He developed castration resistant disease with documented bony metastasis despite combined androgen depravation with Lupron and Casodex. His bone scan done on 09/18/2012 which showed progression of disease. Zytiga a total of 1000 mg started in August of 2014 for the PSA of 32.44. He was discontinued in November 2015 due to a rise in his PSA.  Xtandi 160 mg daily started in November 2015. Stopped on 05/13/2014 due to progression of disease.  He is continued on Lupron at [facility] urology.  His Xgeva to be given and the [place] since 06/2013.  Referred today to discuss radiation options including Xofigo.  PREVIOUS RADIATION THERAPY: Yes   PAST MEDICAL HISTORY:  has a past medical history of Asthma; Allergy; Hypertension; Calcium oxalate renal stones; Dyslipidemia; Cancer (2005); and Prostate cancer.    PAST SURGICAL HISTORY: Past Surgical History  Procedure Laterality Date  . Prostatectomy  06/2003    FAMILY HISTORY:  family history includes Cancer in his father.  SOCIAL HISTORY:  reports that he has never smoked. He has never used smokeless tobacco. He reports that he does not drink alcohol or use illicit drugs.  ALLERGIES: Review of patient's allergies indicates no known allergies.  MEDICATIONS:  Current Outpatient Prescriptions  Medication Sig Dispense Refill  . cholecalciferol (VITAMIN D) 1000 UNITS tablet Take 1,000 Units by mouth daily.    Marland Kitchen lisinopril-hydrochlorothiazide (PRINZIDE,ZESTORETIC) 20-25 MG per tablet TAKE ONE TABLET BY MOUTH EVERY DAY 90 tablet 3  . oxyCODONE-acetaminophen (PERCOCET/ROXICET) 5-325 MG per tablet Take 1-2 tablets by mouth every 6 (six) hours as needed for severe pain. 120 tablet 0  . XOFIGO 27 MCCI/ML SOLN Inject 50 KBq/kg into the vein every 30 (thirty) days. For 6 monthly infusions in nuclear medicine 6 each 0  . XTANDI 40 MG capsule      No current facility-administered medications for this encounter.    REVIEW OF SYSTEMS:  A 15 point review of systems is documented in the electronic medical record. This was obtained by the nursing staff. However, I reviewed this with the patient to discuss relevant findings and make appropriate changes.  Reports nocturia x 1. Reports taking percocet for intermittent low back and bilateral femur pain. Denies pain presently. Denies numbness, tingling or edema of lower extremities. Denies weakness or fatigue. Denies dysuria or hematuria. Reports rare stress incontinence.  IPSS 3. Reports night sweats have resolved since he stopped taking Xtandi. Reports 19 lb weight loss since September of 2015 some of which was intentional.  Reports decreased appetite.    PHYSICAL EXAM:  height is 6\' 2"  (1.88 m) and weight is 205 lb (92.987 kg). His blood pressure is 114/75 and his pulse is 77. His respiration is 16 and oxygen saturation is 100%.   The patient is a very nice well-nourished well-developed gentleman in no acute distress.  KPS = 90  100 -  Normal; no complaints; no evidence of disease. 90   - Able to carry on normal activity; minor signs or symptoms of disease. 80   - Normal activity with effort; some signs or symptoms of disease. 54   - Cares for self; unable to carry on normal activity or to do active work. 60   - Requires occasional assistance, but is able to care for most of his personal needs. 50   - Requires considerable assistance and frequent medical care. 61   - Disabled; requires special care and assistance. 70   - Severely disabled; hospital admission is indicated although death not imminent. 60   - Very sick; hospital admission necessary; active supportive treatment necessary. 10   - Moribund; fatal processes progressing rapidly. 0     - Dead  Karnofsky DA, Abelmann [place], Craver LS and Burchenal JH [phone]) The use of the nitrogen mustards in the palliative treatment of carcinoma: with particular reference to bronchogenic carcinoma Cancer 1 634-56  LABORATORY DATA:  Lab Results  Component Value Date   WBC 11.8* 05/11/2014   HGB 9.5* 05/11/2014   HCT 29.8* 05/11/2014   MCV 82.8 05/11/2014   PLT 365 05/11/2014   Lab Results  Component Value Date   NA 141 05/11/2014   K 4.0 05/11/2014   CL 104 05/25/2012   CO2 22 05/11/2014   Lab Results  Component Value Date   ALT 11 05/11/2014   AST 16 05/11/2014   ALKPHOS 122 05/11/2014   BILITOT 0.25 05/11/2014   Results for ADONIAS, DEMORE (MRN [id number]) as of 05/26/2014 09:44  Ref. Range 12/27/2013 10:45 02/01/2014 11:17 03/08/2014 11:11 04/13/2014 10:48 05/11/2014 09:07  PSA Latest Ref Range: <=4.00 ng/mL 12.87 (H) 17.97 (H) 27.28 (H) 47.84 (H) 81.50 (H)    RADIOGRAPHY: Nm Bone Scan Whole Body  05/24/2014   CLINICAL DATA:  Prostate cancer, bone metastasis, PSA 81.5  EXAM: NUCLEAR MEDICINE WHOLE BODY BONE SCAN  TECHNIQUE: Whole body anterior and posterior images were obtained approximately 3 hours after intravenous injection of radiopharmaceutical.  RADIOPHARMACEUTICALS:   25.0 Technetium-72m MDP IV  COMPARISON:  12/27/2013  FINDINGS: Increased radiotracer uptake which has increased in intensity and extent involving the left acetabulum and extending into the left inferior pubic ramus, right acetabulum extending into the right inferior pubic ramus, around bilateral sacroiliac joints, multiple thoracic spine vertebral bodies and posterior elements and multiple bilateral ribs.  Normal physiologic activity is identified within the kidneys and urinary bladder.  IMPRESSION: Significant interval progression of multifocal osseous metastatic disease.   Electronically Signed   By: Kathreen Devoid   On: 05/24/2014 15:11   Ct Abdomen Pelvis W Contrast  05/24/2014   CLINICAL DATA:  Prostate cancer with osseous metastatic disease. Mid abdominal pain.  EXAM: CT ABDOMEN AND PELVIS WITH CONTRAST  TECHNIQUE: Multidetector CT imaging of the abdomen and pelvis was performed using the standard protocol following bolus administration of intravenous contrast.  CONTRAST:  173mL OMNIPAQUE IOHEXOL 300 MG/ML  SOLN  COMPARISON:  Multiple exams, including 12/27/2013  FINDINGS: Lower chest: Mild lower lobe atelectasis along both hemidiaphragms. Small type 1 hiatal hernia.  Hepatobiliary: Unremarkable  Pancreas: The 2 cm of the dorsal pancreatic duct closest to the ampulla is mildly dilated at 5 mm in diameter, with the rest of the dorsal pancreatic duct normal in caliber. I do not see an ampullary mass.  Spleen: Unremarkable  Adrenals/Urinary Tract: Fullness of both adrenal glands without mass. Right renal cysts are evident; several appear simple and a partially exophytic right kidney lower pole cyst is mildly complex but demonstrates no relative washout to suggest de-enhancement.  Stomach/Bowel: Unremarkable  Vascular/Lymphatic: Unremarkable. Small retroperitoneal lymph nodes are not pathologically enlarged by size criteria.  Reproductive: Prostatectomy.  Other: No supplemental non-categorized findings.   Musculoskeletal: Diffuse scattered sclerotic metastatic lesions throughout the ribs, lower sternum, visualized spine, bony pelvis, and proximal femurs. The osseous metastatic disease is most confluent in the right ischium. I am uncertain whether these represent  Moderate left foraminal stenosis at L5-S1 due to intervertebral spurring.  IMPRESSION: 1. Scattered sclerotic lesions throughout the visualized skeleton compatible with diffuse osseous metastatic disease. I am uncertain whether these lesions are burned out old metastatic lesions which are no longer active, or currently active metastatic disease. Today's bone scan may be helpful in providing correlation in that regard. 2. Mild atelectasis along both hemidiaphragms. 3. Small type 1 hiatal hernia. 4. 2 cm long dilated segment of the dorsal pancreatic duct near the ampulla. The common bile duct does not appear dilated and I do not see an ampullary mass. The cause of this mild pancreatic duct segmental dilatation is uncertain. A true obstructing process near the ampulla would tend to dilate the hold dorsal pancreatic duct rather than just the small segment. Accordingly, this might simply be a postinflammatory dilated segment. 5. Benign-appearing right renal cysts. 6. Moderate left foraminal stenosis at L5-S1 due to intervertebral spurring.   Electronically Signed   By: Van Clines M.D.   On: 05/24/2014 13:33      IMPRESSION: This is a very nice 69 year old gentleman with Widespread metatasis from castration resistant prostate cancer. He may benefit from [place]. At some point, he may also benefit from external beam radiation treatment. However, his current anterior thigh pain does not appear to correlate with focal spots of activity on his bone scan. Empiric treatment directed to the proximal femurs could be considered if his pain does not respond to [employer]  PLAN: Today, I talked to the patient and family about the findings and work-up thus far.  We  discussed the natural history of skeletal metastases from castration resistant prostate cancer and general treatment, highlighting the role of radiotherapy in the management.  We specifically discussed infusional radium therapy. We discussed the available radiation techniques, and focused on the details of logistics and delivery.  We reviewed the anticipated acute and late sequelae associated with radiation in this setting.  The patient was encouraged to ask questions that I answered to the best of my ability.  The patient would like to proceed with radiation and will be scheduled for CT simulation.  I spent 60 minutes minutes face to face with the patient and more than 50% of that time was spent in counseling and/or coordination of care.     This document serves as a record of services personally performed by Tyler Pita, MD. It was created on his behalf by Pearlie Oyster, a trained medical scribe. The creation of this record is based on the scribe's personal observations and the provider's statements to them. This document has been checked and approved by the attending provider.      ------------------------------------------------  Sheral Apley Tammi Klippel, M.D.

## 2014-05-26 NOTE — Progress Notes (Signed)
See progress note under physician encounter. 

## 2014-05-26 NOTE — Progress Notes (Signed)
Reports nocturia x 1. Reports taking percocet for intermittent low back and bilateral femur pain. Denies pain presently. Denies numbness, tingling or edema of lower extremities. Denies weakness or fatigue. Denies dysuria or hematuria. Reports rare stress incontinence.  IPSS 3. Reports night sweats have resolved since he stopped taking Xtandi. Reports 19 lb weight loss since September of 2015 some of which was intentional. Reports decreased appetite.

## 2014-05-26 NOTE — Progress Notes (Signed)
  Radiation Oncology         [phone] ________________________________  Name: Marcus [name] MRN: [id number]  Date: 05/26/2014  DOB: Apr 28, 1945   To whom it may concern:   Marcus [name] is a patient with metastatic, castrate resistant prostate cancer with predominantly bone disease.  For details of his clinical history, please see the previously detailed consultation report that can be supplied with this letter.  I have recommended Radium 223 Dichloride treatment for monthly IV infusion over six months.  Based upon the randomized phase III Alsympca trial, we would expect an improvement in the patient's overall survival, improved pain control, decreased narcotic requirements, and decreased probability of skeletal related adverse events moving forward.  Given the data and this patient's situation, I feel that the delivery of [name] is medical necessity.  Please call if any further information is required to aid in this patient's treatment.    Sincerely yours,  Sheral Apley. Tammi Klippel, M.D.   -----------------------------------------------------------------------------------------------------------  Ref:  Zack Seal, Diannia Ruder, et al., Alpha emitter radium-223 and survival in metastatic prostate cancer. [place] Med. 2013 Jul 18;369(3):213-23

## 2014-05-27 ENCOUNTER — Other Ambulatory Visit: Payer: Self-pay | Admitting: Radiation Oncology

## 2014-05-27 DIAGNOSIS — C7951 Secondary malignant neoplasm of bone: Principal | ICD-10-CM

## 2014-05-27 DIAGNOSIS — C61 Malignant neoplasm of prostate: Secondary | ICD-10-CM

## 2014-05-30 ENCOUNTER — Telehealth: Payer: Self-pay | Admitting: Radiation Oncology

## 2014-05-30 ENCOUNTER — Telehealth: Payer: Self-pay | Admitting: *Deleted

## 2014-05-30 NOTE — Telephone Encounter (Signed)
Returned message left by patient's wife, [place]. She reports that her husband has to take two percocet tablets at a time instead of one to obtain relief. Explained this is fine because the prescription Dr. Tammi Klippel wrote allows for 1-2 tablets every six hours. Also, encouraged her to ensure her husband is taking colace bid to prevent constipation associated with percocet. She verbalized understanding of all reviewed.

## 2014-05-30 NOTE — Telephone Encounter (Signed)
CALLED PATIENT TO INFORM OF XOFIGO INJECTION ON 06-09-14 - ARRIVAL TIME - 11:15 AM @ [facility] AND HIS LABS AND WEIGHT TO BE DONE ON 06-02-14 @ [place], SPOKE WITH PATIENT'S WIFE, [place] AND SHE IS AWARE OF THESE APPTS.

## 2014-05-31 ENCOUNTER — Other Ambulatory Visit: Payer: Self-pay | Admitting: Radiation Oncology

## 2014-05-31 DIAGNOSIS — C61 Malignant neoplasm of prostate: Secondary | ICD-10-CM

## 2014-06-01 ENCOUNTER — Other Ambulatory Visit: Payer: Self-pay | Admitting: Radiation Oncology

## 2014-06-01 ENCOUNTER — Telehealth: Payer: Self-pay | Admitting: *Deleted

## 2014-06-01 DIAGNOSIS — C61 Malignant neoplasm of prostate: Secondary | ICD-10-CM

## 2014-06-01 NOTE — Telephone Encounter (Signed)
Representative from [facility] [phone]) called to notify MD manning that Marcus [name] can not be done at their pharmacy. The medication would have to be given from an infusion center.

## 2014-06-02 ENCOUNTER — Ambulatory Visit
Admission: RE | Admit: 2014-06-02 | Discharge: 2014-06-02 | Disposition: A | Discharge status: Home / Self Care | Payer: Medicare Other | Source: Ambulatory Visit | Attending: Radiation Oncology | Admitting: Radiation Oncology

## 2014-06-02 DIAGNOSIS — C61 Malignant neoplasm of prostate: Secondary | ICD-10-CM | POA: Diagnosis present

## 2014-06-02 LAB — CBC WITH DIFFERENTIAL/PLATELET
BASO%: 0.6 % (ref 0.0–2.0)
BASOS ABS: 0.1 10*3/uL (ref 0.0–0.1)
EOS ABS: 0.1 10*3/uL (ref 0.0–0.5)
EOS%: 1.1 % (ref 0.0–7.0)
HCT: 27.9 % — ABNORMAL LOW (ref 38.4–49.9)
HGB: 9 g/dL — ABNORMAL LOW (ref 13.0–17.1)
LYMPH#: 0.9 10*3/uL (ref 0.9–3.3)
LYMPH%: 8.5 % — AB (ref 14.0–49.0)
MCH: 26.3 pg — ABNORMAL LOW (ref 27.2–33.4)
MCHC: 32.2 g/dL (ref 32.0–36.0)
MCV: 81.6 fL (ref 79.3–98.0)
MONO#: 1.1 10*3/uL — ABNORMAL HIGH (ref 0.1–0.9)
MONO%: 10.3 % (ref 0.0–14.0)
NEUT%: 79.5 % — ABNORMAL HIGH (ref 39.0–75.0)
NEUTROS ABS: 8.7 10*3/uL — AB (ref 1.5–6.5)
Platelets: 405 10*3/uL — ABNORMAL HIGH (ref 140–400)
RBC: 3.43 10*6/uL — AB (ref 4.20–5.82)
RDW: 16.3 % — AB (ref 11.0–14.6)
WBC: 10.9 10*3/uL — ABNORMAL HIGH (ref 4.0–10.3)

## 2014-06-06 ENCOUNTER — Telehealth: Payer: Self-pay | Admitting: Radiation Oncology

## 2014-06-06 ENCOUNTER — Other Ambulatory Visit: Payer: Self-pay | Admitting: Radiation Oncology

## 2014-06-06 DIAGNOSIS — C61 Malignant neoplasm of prostate: Secondary | ICD-10-CM

## 2014-06-06 DIAGNOSIS — C7951 Secondary malignant neoplasm of bone: Secondary | ICD-10-CM

## 2014-06-06 MED ORDER — MORPHINE SULFATE ER 30 MG PO TBCR: 30.0000 mg | EXTENDED_RELEASE_TABLET | Freq: Two times a day (BID) | ORAL | Status: DC

## 2014-06-06 MED ORDER — OXYCODONE-ACETAMINOPHEN 5-325 MG PO TABS: 1.0000 | ORAL_TABLET | ORAL | Status: DC | PRN

## 2014-06-06 MED ORDER — OXYCODONE HCL ER 20 MG PO T12A: 20.0000 mg | EXTENDED_RELEASE_TABLET | Freq: Two times a day (BID) | ORAL | Status: DC

## 2014-06-06 NOTE — Telephone Encounter (Signed)
Phoned [place]. Informed her that her husband's script for percocet is ready for pick up in the rad onc nurses station. She understands to bring her driver license in to pick up script.

## 2014-06-06 NOTE — Telephone Encounter (Signed)
Received call from patient's wife, [place], and Aaron Edelman of [place]. Both report insurance will not cover oxycontin nor percocet. Dr. Lisbeth Renshaw issued new script for percocet and MS Contin 30 mg. This RN hand delivered new script to Aaron Edelman at [facility] where patient and wife were waiting. Stressed per Dr. Ida Rogue order that the patient should NOT take more than 10 percocet tablets in 24 hours. Both he and his wife verbalized understanding.

## 2014-06-08 ENCOUNTER — Telehealth: Payer: Self-pay | Admitting: *Deleted

## 2014-06-08 NOTE — Telephone Encounter (Signed)
Called patient to remind of Xofigo Inj. On 06-09-14- arrival time- 11:15 am  @ [facility], spoke with patient and he is aware of this appt.

## 2014-06-09 ENCOUNTER — Encounter (HOSPITAL_COMMUNITY)
Admission: RE | Admit: 2014-06-09 | Discharge: 2014-06-09 | Disposition: A | Discharge status: Home / Self Care | Payer: Medicare Other | Source: Ambulatory Visit | Attending: Radiation Oncology | Admitting: Radiation Oncology

## 2014-06-09 DIAGNOSIS — C7951 Secondary malignant neoplasm of bone: Secondary | ICD-10-CM | POA: Insufficient documentation

## 2014-06-09 DIAGNOSIS — C61 Malignant neoplasm of prostate: Secondary | ICD-10-CM | POA: Insufficient documentation

## 2014-06-09 NOTE — Progress Notes (Signed)
  Radiation Oncology         [phone] ________________________________  Name: Marcus [name] MRN: [id number]  Date: 06/09/2014  DOB: [birth year]  Radium-223 Infusion Note  Diagnosis:  Castration resistant prostate cancer with painful bone involvement  Current Infusion:    1  Planned Infusions:  6  Narrative: Marcus [name] presented to nuclear medicine for treatment. His most recent blood counts were reviewed.  He remains a good candidate to proceed with Ra-223.  The patient was situated in an infusion suite with a contact barrier placed under his arm. Intravenous access was established, using sterile technique, and a normal saline infusion from a syringe was started.  Micro-dosimetry:  The prescribed radiation activity was assayed and confirmed to be within specified tolerance.  Special Treatment Procedure - Infusion:  The nuclear medicine technologist and I personally verified the dose activity to be delivered as specified in the written directive, and verified the patient identification via 2 separate methods.  The syringe containing the dose was attached to a 3 way stopcock, and then the valve was opened to the patient, and the dose delivered over a minute. No complications were noted.  The total administered dose was 134.3 microcuries in a volume of 7 cc.   A saline flush of the line and the syringe that contained the isotope was then performed.  The residual radioactivity in the syringe was 0.90 microcuries, so the actual infused isotope activity was 133.4 microcuries.   Pressure was applied to the venipuncture site, and a compression bandage placed.   Radiation Safety personnel were present to perform the discharge survey, as detailed on their documentation.   After a short period of observation, the patient had his IV removed.  Impression:  The patient tolerated his infusion relatively well.  Plan:  The patient will return in one month for ongoing care.    This document serves  as a record of services personally performed by Tyler Pita, MD. It was created on his behalf by Arlyce Harman, a trained medical scribe. The creation of this record is based on the scribe's personal observations and the provider's statements to them. This document has been checked and approved by the attending provider.     ________________________________  Sheral Apley. Tammi Klippel, M.D.

## 2014-06-13 ENCOUNTER — Other Ambulatory Visit: Payer: Self-pay | Admitting: *Deleted

## 2014-06-13 ENCOUNTER — Telehealth: Payer: Self-pay | Admitting: *Deleted

## 2014-06-13 MED ORDER — PROCHLORPERAZINE MALEATE 10 MG PO TABS: 10.0000 mg | ORAL_TABLET | Freq: Four times a day (QID) | ORAL | Status: DC | PRN

## 2014-06-13 NOTE — Telephone Encounter (Signed)
Notified patient that anti-emetic was e-scribed to [facility] in [place].

## 2014-06-13 NOTE — Telephone Encounter (Signed)
Pt's wife called regarding "something for nausea". Dixie RN made aware and is to speak with Dr. Alen Blew regarding prescription. Attempted to return call; no answer.

## 2014-06-14 ENCOUNTER — Encounter: Payer: Self-pay | Admitting: Physician Assistant

## 2014-06-14 ENCOUNTER — Other Ambulatory Visit (HOSPITAL_BASED_OUTPATIENT_CLINIC_OR_DEPARTMENT_OTHER): Payer: Medicare Other

## 2014-06-14 ENCOUNTER — Ambulatory Visit (HOSPITAL_BASED_OUTPATIENT_CLINIC_OR_DEPARTMENT_OTHER): Payer: Medicare Other

## 2014-06-14 ENCOUNTER — Ambulatory Visit (HOSPITAL_BASED_OUTPATIENT_CLINIC_OR_DEPARTMENT_OTHER): Payer: Medicare Other | Admitting: Physician Assistant

## 2014-06-14 ENCOUNTER — Telehealth: Payer: Self-pay | Admitting: Physician Assistant

## 2014-06-14 VITALS — BP 105/58 | HR 79 | Temp 99.0°F | Resp 17 | Ht 74.0 in | Wt 196.7 lb

## 2014-06-14 DIAGNOSIS — C61 Malignant neoplasm of prostate: Secondary | ICD-10-CM | POA: Diagnosis not present

## 2014-06-14 DIAGNOSIS — M545 Low back pain: Secondary | ICD-10-CM | POA: Diagnosis not present

## 2014-06-14 DIAGNOSIS — M79651 Pain in right thigh: Secondary | ICD-10-CM | POA: Diagnosis not present

## 2014-06-14 DIAGNOSIS — R112 Nausea with vomiting, unspecified: Secondary | ICD-10-CM

## 2014-06-14 DIAGNOSIS — C7951 Secondary malignant neoplasm of bone: Secondary | ICD-10-CM | POA: Diagnosis not present

## 2014-06-14 DIAGNOSIS — E291 Testicular hypofunction: Secondary | ICD-10-CM

## 2014-06-14 LAB — CBC WITH DIFFERENTIAL/PLATELET
BASO%: 0.1 % (ref 0.0–2.0)
Basophils Absolute: 0 10*3/uL (ref 0.0–0.1)
EOS%: 0.3 % (ref 0.0–7.0)
Eosinophils Absolute: 0 10*3/uL (ref 0.0–0.5)
HCT: 27.5 % — ABNORMAL LOW (ref 38.4–49.9)
HGB: 8.7 g/dL — ABNORMAL LOW (ref 13.0–17.1)
LYMPH#: 0.7 10*3/uL — AB (ref 0.9–3.3)
LYMPH%: 6.7 % — ABNORMAL LOW (ref 14.0–49.0)
MCH: 26.1 pg — ABNORMAL LOW (ref 27.2–33.4)
MCHC: 31.6 g/dL — ABNORMAL LOW (ref 32.0–36.0)
MCV: 82.6 fL (ref 79.3–98.0)
MONO#: 0.9 10*3/uL (ref 0.1–0.9)
MONO%: 8.6 % (ref 0.0–14.0)
NEUT#: 9 10*3/uL — ABNORMAL HIGH (ref 1.5–6.5)
NEUT%: 84.3 % — AB (ref 39.0–75.0)
PLATELETS: 308 10*3/uL (ref 140–400)
RBC: 3.33 10*6/uL — ABNORMAL LOW (ref 4.20–5.82)
RDW: 16.4 % — AB (ref 11.0–14.6)
WBC: 10.7 10*3/uL — ABNORMAL HIGH (ref 4.0–10.3)

## 2014-06-14 LAB — COMPREHENSIVE METABOLIC PANEL (CC13)
ALT: 18 U/L (ref 0–55)
AST: 22 U/L (ref 5–34)
Albumin: 2.4 g/dL — ABNORMAL LOW (ref 3.5–5.0)
Alkaline Phosphatase: 192 U/L — ABNORMAL HIGH (ref 40–150)
Anion Gap: 13 mEq/L — ABNORMAL HIGH (ref 3–11)
BUN: 17 mg/dL (ref 7.0–26.0)
CALCIUM: 8.6 mg/dL (ref 8.4–10.4)
CHLORIDE: 99 meq/L (ref 98–109)
CO2: 25 mEq/L (ref 22–29)
CREATININE: 1.1 mg/dL (ref 0.7–1.3)
EGFR: 80 mL/min/{1.73_m2} — ABNORMAL LOW (ref >90)
Glucose: 118 mg/dl (ref 70–140)
POTASSIUM: 4.1 meq/L (ref 3.5–5.1)
Sodium: 136 mEq/L (ref 136–145)
Total Bilirubin: 0.36 mg/dL (ref 0.20–1.20)
Total Protein: 7.1 g/dL (ref 6.4–8.3)

## 2014-06-14 MED ORDER — MEGESTROL ACETATE 40 MG/ML PO SUSP: 400.0000 mg | Freq: Every day | ORAL | Status: DC

## 2014-06-14 MED ORDER — DENOSUMAB 120 MG/1.7ML ~~LOC~~ SOLN
120.0000 mg | Freq: Once | SUBCUTANEOUS | Status: AC
  Administered 2014-06-14: 120 mg via SUBCUTANEOUS
  Filled 2014-06-14: qty 1.7

## 2014-06-14 NOTE — Progress Notes (Signed)
Hematology and Oncology Follow Up Visit  BROK STOCKING [id number] 1945/04/27 69 y.o. 06/14/2014 2:27 PM   Principle Diagnosis: 69 year old gentleman diagnosed with prostate cancer in 2005. 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   Prior Therapy:  He is status post prostatectomy and subsequently was treated with salvage radiation therapy in 2007.  In February of 2013 his PSA was up to 22.5 and was started on androgen deprivation.  He developed castration resistant disease with documented bony metastasis despite combined androgen depravation with Lupron and Casodex. His last bone scan done on 09/18/2012 which showed progression of disease. Zytiga a total of 1000 mg started in August of 2014 for the PSA of 32.44. He was discontinued in November 2015 due to a rise in his PSA Xtandi 160 mg daily started in November 2015. Stopped on 05/13/2014 due to progression of disease.   Current therapy:   Xofigo under the care of Dr. Tammi Klippel. First treatment given 06/09/2014 He is continued on Lupron at [facility] urology. His Xgeva to be given and the [place] since 06/2013.   Interim History:  Mr. Minion presents today for a followup visit with his family. Since his last visit, he reports nausea, vomiting and decreased appetite. He continues to report some pain in his right thigh/femur region as well as low back pain. He is taking 8-10 Percocet tablets daily with reasonable control of his pain. Not associated with any neurological deficits. He continues to overall have excellent performance status and quality of life. He did not report any nausea or vomiting or abdominal pain. Did not report any urinary symptoms of hematuria dysuria or incontinence. He did not report any other musculoskeletal pain arthralgias or myalgias. Did not report any lower extremity swelling. He has not reported any neurological symptoms including headaches blurred vision or seizures. He has  not reported any chest pain or difficulty breathing. Has not reported any cough or hemoptysis or wheezing. Has not reported any major changes in his bowel habits. He did not report any skin changes or petechiae. Has not reported any lymphadenopathy. He has not reported any pathological fractures or hospitalizations. Remainder of his review of system is unremarkable.  Medications: I have reviewed the patient's current medications.  Current Outpatient Prescriptions  Medication Sig Dispense Refill  . cholecalciferol (VITAMIN D) 1000 UNITS tablet Take 1,000 Units by mouth daily.    Marland Kitchen lisinopril-hydrochlorothiazide (PRINZIDE,ZESTORETIC) 20-25 MG per tablet TAKE ONE TABLET BY MOUTH EVERY DAY 90 tablet 3  . morphine (MS CONTIN) 30 MG 12 hr tablet Take 1 tablet (30 mg total) by mouth every 12 (twelve) hours. 60 tablet 0  . oxyCODONE-acetaminophen (PERCOCET/ROXICET) 5-325 MG per tablet Take 1-2 tablets by mouth every 4 (four) hours as needed for severe pain (Do not take more than 12 tablets per 24 hours.). 120 tablet 0  . prochlorperazine (COMPAZINE) 10 MG tablet Take 1 tablet (10 mg total) by mouth every 6 (six) hours as needed for nausea or vomiting. 30 tablet 1  . XOFIGO 27 MCCI/ML SOLN Inject 50 KBq/kg into the vein every 30 (thirty) days. For 6 monthly infusions in nuclear medicine 6 each 0  . XTANDI 40 MG capsule     . megestrol (MEGACE) 40 MG/ML suspension Take 10 mLs (400 mg total) by mouth daily. 480 mL 0   No current facility-administered medications for this visit.     Allergies: No Known Allergies   Physical Exam: Blood pressure 105/58, pulse 79, temperature 99  F (37.2 C), temperature source Oral, resp. rate 17, height 6\' 2"  (1.88 m), weight 196 lb 11.2 oz (89.223 kg), SpO2 99 %. ECOG: 0 General appearance: alert and awake not in any distress Head: Normocephalic, without obvious abnormality Neck: no adenopathy and No masses or thyromegaly. Lymph nodes: Cervical, supraclavicular, and  axillary nodes normal. Heart:regular rate and rhythm, S1, S2. No murmurs rubs or gallops. Lung:chest clear, no wheezing, rales.  Abdomen: soft, non-tender, without masses or organomegaly no shifting dullness EXT:no erythema, induration, no edema noted. Neurological: No deficits and his motor, sensory or deep tendon reflexes.   Lab Results: Lab Results  Component Value Date   WBC 10.7* 06/14/2014   HGB 8.7* 06/14/2014   HCT 27.5* 06/14/2014   MCV 82.6 06/14/2014   PLT 308 06/14/2014     Chemistry      Component Value Date/Time   NA 136 06/14/2014 1108   NA 139 05/25/2012 1522   K 4.1 06/14/2014 1108   K 4.2 05/25/2012 1522   CL 104 05/25/2012 1522   CO2 25 06/14/2014 1108   CO2 27 05/25/2012 1522   BUN 17.0 06/14/2014 1108   BUN 17 05/25/2012 1522   CREATININE 1.1 06/14/2014 1108   CREATININE 1.37* 05/25/2012 1522      Component Value Date/Time   CALCIUM 8.6 06/14/2014 1108   CALCIUM 11.0* 05/25/2012 1522   ALKPHOS 192* 06/14/2014 1108   ALKPHOS 78 05/25/2012 1522   AST 22 06/14/2014 1108   AST 31 05/25/2012 1522   ALT 18 06/14/2014 1108   ALT 37 05/25/2012 1522   BILITOT 0.36 06/14/2014 1108   BILITOT 0.4 05/25/2012 1522         Results for ESTUS, KRAKOWSKI (MRN [id number]) as of 05/13/2014 16:30  Ref. Range 03/08/2014 11:11 04/13/2014 10:48 05/11/2014 09:07  PSA Latest Ref Range: <=4.00 ng/mL 27.28 (H) 47.84 (H) 81.50 (H)       Impression and Plan:  69 year old gentleman with the following issues:  1. Castration resistant prostate cancer with metastatic disease to the bone. He has developed castration resistant disease and have progressed on both Zytiga and most recently on xtandi. His most recent PSA was up to 81 from 47.8.  He is status post 1 of 6 planned treatments with Xofigo under the care of Dr. Tammi Klippel. He tolerated the treatment without any difficulty.Patient is very hesitant to proceed with chemotherapy but certainly would consider it if he has  visceral metastasis.  The plan is to schedule his CT scan and a bone scan in the near future as well as a referral to radiation oncology for an evaluation regarding Xofigo.   2. Androgen depravation: His last testosterone is 91 in December 2015 and will be repeated in the future and administer Lupron as needed.  3. Bone directed therapy: He is on Xgeva monthly which he will be receiving here at this time. He will continue calcium and vitamin D supplement as well as continuous education about osteonecrosis of the jaw. He has no issues continuing Xgeva for now.   4.Right thigh pain/back pain: He will continue on Percocet as prescribed by radiation therapy.He will continue palliative radiation therapy as scheduled.  5. Followup: Will be in about 4 weeks.  Awilda Metro E PA-C  5/24/20162:27 PM

## 2014-06-14 NOTE — Patient Instructions (Signed)
Denosumab injection What is this medicine? DENOSUMAB (den oh sue mab) slows bone breakdown. Prolia is used to treat osteoporosis in women after menopause and in men. Xgeva is used to prevent bone fractures and other bone problems caused by cancer bone metastases. Xgeva is also used to treat giant cell tumor of the bone. This medicine may be used for other purposes; ask your health care provider or pharmacist if you have questions. COMMON BRAND NAME(S): Prolia, XGEVA What should I tell my health care provider before I take this medicine? They need to know if you have any of these conditions: -dental disease -eczema -infection or history of infections -kidney disease or on dialysis -low blood calcium or vitamin D -malabsorption syndrome -scheduled to have surgery or tooth extraction -taking medicine that contains denosumab -thyroid or parathyroid disease -an unusual reaction to denosumab, other medicines, foods, dyes, or preservatives -pregnant or trying to get pregnant -breast-feeding How should I use this medicine? This medicine is for injection under the skin. It is given by a health care professional in a hospital or clinic setting. If you are getting Prolia, a special MedGuide will be given to you by the pharmacist with each prescription and refill. Be sure to read this information carefully each time. For Prolia, talk to your pediatrician regarding the use of this medicine in children. Special care may be needed. For Xgeva, talk to your pediatrician regarding the use of this medicine in children. While this drug may be prescribed for children as young as 13 years for selected conditions, precautions do apply. Overdosage: If you think you've taken too much of this medicine contact a poison control center or emergency room at once. Overdosage: If you think you have taken too much of this medicine contact a poison control center or emergency room at once. NOTE: This medicine is only for  you. Do not share this medicine with others. What if I miss a dose? It is important not to miss your dose. Call your doctor or health care professional if you are unable to keep an appointment. What may interact with this medicine? Do not take this medicine with any of the following medications: -other medicines containing denosumab This medicine may also interact with the following medications: -medicines that suppress the immune system -medicines that treat cancer -steroid medicines like prednisone or cortisone This list may not describe all possible interactions. Give your health care provider a list of all the medicines, herbs, non-prescription drugs, or dietary supplements you use. Also tell them if you smoke, drink alcohol, or use illegal drugs. Some items may interact with your medicine. What should I watch for while using this medicine? Visit your doctor or health care professional for regular checks on your progress. Your doctor or health care professional may order blood tests and other tests to see how you are doing. Call your doctor or health care professional if you get a cold or other infection while receiving this medicine. Do not treat yourself. This medicine may decrease your body's ability to fight infection. You should make sure you get enough calcium and vitamin D while you are taking this medicine, unless your doctor tells you not to. Discuss the foods you eat and the vitamins you take with your health care professional. See your dentist regularly. Brush and floss your teeth as directed. Before you have any dental work done, tell your dentist you are receiving this medicine. Do not become pregnant while taking this medicine or for 5 months after stopping   it. Women should inform their doctor if they wish to become pregnant or think they might be pregnant. There is a potential for serious side effects to an unborn child. Talk to your health care professional or pharmacist for more  information. What side effects may I notice from receiving this medicine? Side effects that you should report to your doctor or health care professional as soon as possible: -allergic reactions like skin rash, itching or hives, swelling of the face, lips, or tongue -breathing problems -chest pain -fast, irregular heartbeat -feeling faint or lightheaded, falls -fever, chills, or any other sign of infection -muscle spasms, tightening, or twitches -numbness or tingling -skin blisters or bumps, or is dry, peels, or red -slow healing or unexplained pain in the mouth or jaw -unusual bleeding or bruising Side effects that usually do not require medical attention (Report these to your doctor or health care professional if they continue or are bothersome.): -muscle pain -stomach upset, gas This list may not describe all possible side effects. Call your doctor for medical advice about side effects. You may report side effects to FDA at 1-800-FDA-1088. Where should I keep my medicine? This medicine is only given in a clinic, doctor's office, or other health care setting and will not be stored at home. NOTE: This sheet is a summary. It may not cover all possible information. If you have questions about this medicine, talk to your doctor, pharmacist, or health care provider.  2015, [employer]vier/Gold Standard. (2011-07-08 12:37:47)  

## 2014-06-14 NOTE — Telephone Encounter (Signed)
Pt confirmed labs/ov per 05/24 POF, gave pt AVS and Calendar..... KJ °

## 2014-06-15 LAB — PSA: PSA: 209 ng/mL — ABNORMAL HIGH (ref <=4.00)

## 2014-06-18 NOTE — Patient Instructions (Signed)
Continue with Xofigo as scheduled Follow up in one month

## 2014-06-21 ENCOUNTER — Telehealth: Payer: Self-pay | Admitting: Radiation Oncology

## 2014-06-21 NOTE — Telephone Encounter (Addendum)
Received message from patient's wife requesting refill of percocet. She reports that her husband is completely out of this medication but, does have a few MS Contin left. Explained Dr. Tammi Klippel is out of the office today but, this writer will request another physician refill the percocet script and will call her back. Patient's wife called back numerous times this same morning. Explained that Dr. Pablo Ledger would prefer Dr. Tammi Klippel refill this medication. Offered patient a 06/23/14 1045 follow up appointment with Dr. Tammi Klippel to discuss pain management and the patient accepted.

## 2014-06-22 ENCOUNTER — Other Ambulatory Visit: Payer: Self-pay | Admitting: Radiation Oncology

## 2014-06-22 DIAGNOSIS — C7951 Secondary malignant neoplasm of bone: Principal | ICD-10-CM

## 2014-06-22 DIAGNOSIS — C61 Malignant neoplasm of prostate: Secondary | ICD-10-CM

## 2014-06-23 ENCOUNTER — Encounter: Payer: Self-pay | Admitting: Radiation Oncology

## 2014-06-23 ENCOUNTER — Ambulatory Visit
Admission: RE | Admit: 2014-06-23 | Discharge: 2014-06-23 | Disposition: A | Discharge status: Home / Self Care | Payer: Medicare Other | Source: Ambulatory Visit | Attending: Radiation Oncology | Admitting: Radiation Oncology

## 2014-06-23 VITALS — BP 126/75 | HR 79 | Resp 16 | Wt 198.9 lb

## 2014-06-23 DIAGNOSIS — C61 Malignant neoplasm of prostate: Secondary | ICD-10-CM

## 2014-06-23 DIAGNOSIS — C7951 Secondary malignant neoplasm of bone: Secondary | ICD-10-CM

## 2014-06-23 MED ORDER — OXYCODONE-ACETAMINOPHEN 5-325 MG PO TABS: 1.0000 | ORAL_TABLET | ORAL | Status: DC | PRN

## 2014-06-23 MED ORDER — MORPHINE SULFATE ER 30 MG PO TBCR: 30.0000 mg | EXTENDED_RELEASE_TABLET | Freq: Two times a day (BID) | ORAL | Status: DC

## 2014-06-23 NOTE — Progress Notes (Signed)
Patient here today with his wife to discuss pain management with Dr. Tammi Klippel. Patient scheduled for second xofigo injection on 6/23. Also, patient scheduled for a follow up with Dr. Alen Blew on 6/21. Patient ran out of Percocet. Since running out of Percocet patient has been taking Aleve and MS Contin sporadically. Patient reports generalized pain but, confirms the worst pain is felt in his right hip, knee and femur. Reports the last time he took MS Contin was 1030 last night and Aleve at 0630 today. Educated patient reference importance of taking long acting MS Contin around the clock about the same time each day then, short acting prn. Also, encouraged patient to allow this RN 48-72 for pain medication refills. Discussed concerns about addiction to pain medication vs management of bone pain. Also, discussed lose of control and changing roles. Patient verbalized understanding of all discussed.

## 2014-06-23 NOTE — Progress Notes (Signed)
Radiation Oncology         [phone] ________________________________  Name: Marcus [name] MRN: [id number]  Date: 06/23/2014  DOB: 1945/03/12  Follow-Up Visit Note  CC: Wyatt Haste, MD  Denita Lung, MD  Diagnosis:   Castration resistant prostate cancer with painful bone involvement    ICD-9-CM ICD-10-CM   1. Prostate cancer 185 C61 oxyCODONE-acetaminophen (PERCOCET/ROXICET) 5-325 MG per tablet     morphine (MS CONTIN) 30 MG 12 hr tablet  2. Bone metastases 198.5 C79.51 oxyCODONE-acetaminophen (PERCOCET/ROXICET) 5-325 MG per tablet     morphine (MS CONTIN) 30 MG 12 hr tablet    Interval Since Last Radiation:  Currently receiving xofigo    Narrative:  The patient returns today for routine follow-up. He returns to review pain management.  He localizes pain to the right mid thigh, worse with trying to sleep, and less noticeable when active.  ALLERGIES:  has No Known Allergies.  Meds: Current Outpatient Prescriptions  Medication Sig Dispense Refill  . cholecalciferol (VITAMIN D) 1000 UNITS tablet Take 1,000 Units by mouth daily.    Marland Kitchen lisinopril-hydrochlorothiazide (PRINZIDE,ZESTORETIC) 20-25 MG per tablet TAKE ONE TABLET BY MOUTH EVERY DAY 90 tablet 3  . megestrol (MEGACE) 40 MG/ML suspension Take 10 mLs (400 mg total) by mouth daily. 480 mL 0  . morphine (MS CONTIN) 30 MG 12 hr tablet Take 1 tablet (30 mg total) by mouth every 12 (twelve) hours. 60 tablet 0  . oxyCODONE-acetaminophen (PERCOCET/ROXICET) 5-325 MG per tablet Take 1-2 tablets by mouth every 4 (four) hours as needed for severe pain (Do not take more than 12 tablets per 24 hours.). 120 tablet 0  . prochlorperazine (COMPAZINE) 10 MG tablet Take 1 tablet (10 mg total) by mouth every 6 (six) hours as needed for nausea or vomiting. 30 tablet 1  . XOFIGO 27 MCCI/ML SOLN Inject 50 KBq/kg into the vein every 30 (thirty) days. For 6 monthly infusions in nuclear medicine 6 each 0  . XTANDI 40 MG capsule      No  current facility-administered medications for this encounter.    Physical Findings: The patient is in no acute distress. Patient is alert and oriented.  weight is 198 lb 14.4 oz (90.22 kg). His blood pressure is 126/75 and his pulse is 79. His respiration is 16 and oxygen saturation is 100%. .  No significant changes.  Lab Findings: Lab Results  Component Value Date   WBC 10.7* 06/14/2014   WBC 8.1 05/25/2012   HGB 8.7* 06/14/2014   HGB 12.7* 05/25/2012   HCT 27.5* 06/14/2014   HCT 37.1* 05/25/2012   PLT 308 06/14/2014   PLT 292 05/25/2012    Lab Results  Component Value Date   NA 136 06/14/2014   NA 139 05/25/2012   K 4.1 06/14/2014   K 4.2 05/25/2012   CHLORIDE 99 06/14/2014   CO2 25 06/14/2014   CO2 27 05/25/2012   GLUCOSE 118 06/14/2014   GLUCOSE 86 05/25/2012   BUN 17.0 06/14/2014   BUN 17 05/25/2012   CREATININE 1.1 06/14/2014   CREATININE 1.37* 05/25/2012   BILITOT 0.36 06/14/2014   BILITOT 0.4 05/25/2012   ALKPHOS 192* 06/14/2014   ALKPHOS 78 05/25/2012   AST 22 06/14/2014   AST 31 05/25/2012   ALT 18 06/14/2014   ALT 37 05/25/2012   PROT 7.1 06/14/2014   PROT 7.3 05/25/2012   ALBUMIN 2.4* 06/14/2014   ALBUMIN 4.4 05/25/2012   CALCIUM 8.6 06/14/2014   CALCIUM 11.0* 05/25/2012  ANIONGAP 13* 06/14/2014    Radiographic Findings: Nm Bone Scan Whole Body  05/24/2014   CLINICAL DATA:  Prostate cancer, bone metastasis, PSA 81.5  EXAM: NUCLEAR MEDICINE WHOLE BODY BONE SCAN  TECHNIQUE: Whole body anterior and posterior images were obtained approximately 3 hours after intravenous injection of radiopharmaceutical.  RADIOPHARMACEUTICALS:  25.0 Technetium-87mMDP IV  COMPARISON:  12/27/2013  FINDINGS: Increased radiotracer uptake which has increased in intensity and extent involving the left acetabulum and extending into the left inferior pubic ramus, right acetabulum extending into the right inferior pubic ramus, around bilateral sacroiliac joints, multiple thoracic  spine vertebral bodies and posterior elements and multiple bilateral ribs.  Normal physiologic activity is identified within the kidneys and urinary bladder.  IMPRESSION: Significant interval progression of multifocal osseous metastatic disease.   Electronically Signed   By: HKathreen Devoid  On: 05/24/2014 15:11   Ct Abdomen Pelvis W Contrast  05/24/2014   CLINICAL DATA:  Prostate cancer with osseous metastatic disease. Mid abdominal pain.  EXAM: CT ABDOMEN AND PELVIS WITH CONTRAST  TECHNIQUE: Multidetector CT imaging of the abdomen and pelvis was performed using the standard protocol following bolus administration of intravenous contrast.  CONTRAST:  1047mOMNIPAQUE IOHEXOL 300 MG/ML  SOLN  COMPARISON:  Multiple exams, including 12/27/2013  FINDINGS: Lower chest: Mild lower lobe atelectasis along both hemidiaphragms. Small type 1 hiatal hernia.  Hepatobiliary: Unremarkable  Pancreas: The 2 cm of the dorsal pancreatic duct closest to the ampulla is mildly dilated at 5 mm in diameter, with the rest of the dorsal pancreatic duct normal in caliber. I do not see an ampullary mass.  Spleen: Unremarkable  Adrenals/Urinary Tract: Fullness of both adrenal glands without mass. Right renal cysts are evident; several appear simple and a partially exophytic right kidney lower pole cyst is mildly complex but demonstrates no relative washout to suggest de-enhancement.  Stomach/Bowel: Unremarkable  Vascular/Lymphatic: Unremarkable. Small retroperitoneal lymph nodes are not pathologically enlarged by size criteria.  Reproductive: Prostatectomy.  Other: No supplemental non-categorized findings.  Musculoskeletal: Diffuse scattered sclerotic metastatic lesions throughout the ribs, lower sternum, visualized spine, bony pelvis, and proximal femurs. The osseous metastatic disease is most confluent in the right ischium. I am uncertain whether these represent  Moderate left foraminal stenosis at L5-S1 due to intervertebral spurring.   IMPRESSION: 1. Scattered sclerotic lesions throughout the visualized skeleton compatible with diffuse osseous metastatic disease. I am uncertain whether these lesions are burned out old metastatic lesions which are no longer active, or currently active metastatic disease. Today's bone scan may be helpful in providing correlation in that regard. 2. Mild atelectasis along both hemidiaphragms. 3. Small type 1 hiatal hernia. 4. 2 cm long dilated segment of the dorsal pancreatic duct near the ampulla. The common bile duct does not appear dilated and I do not see an ampullary mass. The cause of this mild pancreatic duct segmental dilatation is uncertain. A true obstructing process near the ampulla would tend to dilate the hold dorsal pancreatic duct rather than just the small segment. Accordingly, this might simply be a postinflammatory dilated segment. 5. Benign-appearing right renal cysts. 6. Moderate left foraminal stenosis at L5-S1 due to intervertebral spurring.   Electronically Signed   By: WaVan Clines.D.   On: 05/24/2014 13:33   Nm Xofigo Injection  06/09/2014   CLINICAL DATA: prostate ca bone mets    Xofigo was injected intravenously in Nuclear Medicine under the  supervision of the attending radiologist     Impression:  Patient  is tolerating xofigo.  His pain medicine regimen is complicated by some non-compliance with long acting med.  He has fairly well localized pain raising a question about whether to use focal external radiotherapy, but, there is no correlating met on bone scan in the right mid thigh.  He does have uptake in the right acetabulum  Plan:  Continue xofigo and current pain management medications, MS Contin and Percocet. Refer to Wadie Lessen for pain mgmt and advanced directives.  We will anticipate some improvement in pain with Xofigo.  If not, we may consider localized external radiotherapy to the right acetabulum.  This document serves as a record of services personally  performed by Tyler Pita, MD. It was created on his behalf by Pearlie Oyster, a trained medical scribe. The creation of this record is based on the scribe's personal observations and the provider's statements to them. This document has been checked and approved by the attending provider.      _____________________________________  Sheral Apley. Tammi Klippel, M.D.

## 2014-06-24 ENCOUNTER — Telehealth: Payer: Self-pay | Admitting: Radiation Oncology

## 2014-06-24 NOTE — Telephone Encounter (Addendum)
Phoned patient's wife, [place]. Informed her of palliative care consult with Wadie Lessen, NP for 07/04/2014 at 1000. She verbalized understanding and confirmed they would present as scheduled.

## 2014-06-30 ENCOUNTER — Other Ambulatory Visit: Payer: Self-pay | Admitting: Family Medicine

## 2014-07-04 ENCOUNTER — Ambulatory Visit
Admission: RE | Admit: 2014-07-04 | Payer: Medicare Other | Source: Ambulatory Visit | Attending: Radiation Oncology | Admitting: Radiation Oncology

## 2014-07-12 ENCOUNTER — Other Ambulatory Visit (HOSPITAL_BASED_OUTPATIENT_CLINIC_OR_DEPARTMENT_OTHER): Payer: Medicare Other

## 2014-07-12 ENCOUNTER — Other Ambulatory Visit: Payer: Self-pay | Admitting: *Deleted

## 2014-07-12 ENCOUNTER — Ambulatory Visit (HOSPITAL_BASED_OUTPATIENT_CLINIC_OR_DEPARTMENT_OTHER): Payer: Medicare Other

## 2014-07-12 ENCOUNTER — Ambulatory Visit (HOSPITAL_BASED_OUTPATIENT_CLINIC_OR_DEPARTMENT_OTHER): Payer: Medicare Other | Admitting: Oncology

## 2014-07-12 ENCOUNTER — Telehealth: Payer: Self-pay | Admitting: Oncology

## 2014-07-12 ENCOUNTER — Telehealth: Payer: Self-pay | Admitting: Radiation Oncology

## 2014-07-12 VITALS — BP 110/73 | HR 66 | Temp 98.3°F | Resp 18 | Ht 74.0 in | Wt 196.6 lb

## 2014-07-12 DIAGNOSIS — K5900 Constipation, unspecified: Secondary | ICD-10-CM

## 2014-07-12 DIAGNOSIS — C61 Malignant neoplasm of prostate: Secondary | ICD-10-CM

## 2014-07-12 DIAGNOSIS — C7951 Secondary malignant neoplasm of bone: Secondary | ICD-10-CM

## 2014-07-12 LAB — COMPREHENSIVE METABOLIC PANEL (CC13)
ALBUMIN: 3.2 g/dL — AB (ref 3.5–5.0)
ALK PHOS: 204 U/L — AB (ref 40–150)
ALT: 13 U/L (ref 0–55)
AST: 13 U/L (ref 5–34)
Anion Gap: 6 mEq/L (ref 3–11)
BUN: 19.9 mg/dL (ref 7.0–26.0)
CALCIUM: 9.3 mg/dL (ref 8.4–10.4)
CHLORIDE: 108 meq/L (ref 98–109)
CO2: 23 mEq/L (ref 22–29)
Creatinine: 1.1 mg/dL (ref 0.7–1.3)
EGFR: 80 mL/min/{1.73_m2} — AB (ref >90)
GLUCOSE: 99 mg/dL (ref 70–140)
Potassium: 4.5 mEq/L (ref 3.5–5.1)
Sodium: 136 mEq/L (ref 136–145)
Total Bilirubin: 0.26 mg/dL (ref 0.20–1.20)
Total Protein: 6.6 g/dL (ref 6.4–8.3)

## 2014-07-12 LAB — CBC WITH DIFFERENTIAL/PLATELET
BASO%: 0.8 % (ref 0.0–2.0)
Basophils Absolute: 0.1 10*3/uL (ref 0.0–0.1)
EOS%: 8.1 % — ABNORMAL HIGH (ref 0.0–7.0)
Eosinophils Absolute: 0.5 10*3/uL (ref 0.0–0.5)
HEMATOCRIT: 27.1 % — AB (ref 38.4–49.9)
HGB: 8.9 g/dL — ABNORMAL LOW (ref 13.0–17.1)
LYMPH%: 13.1 % — AB (ref 14.0–49.0)
MCH: 27.2 pg (ref 27.2–33.4)
MCHC: 32.7 g/dL (ref 32.0–36.0)
MCV: 83.1 fL (ref 79.3–98.0)
MONO#: 0.7 10*3/uL (ref 0.1–0.9)
MONO%: 10.2 % (ref 0.0–14.0)
NEUT#: 4.4 10*3/uL (ref 1.5–6.5)
NEUT%: 67.8 % (ref 39.0–75.0)
PLATELETS: 230 10*3/uL (ref 140–400)
RBC: 3.27 10*6/uL — AB (ref 4.20–5.82)
RDW: 21.5 % — ABNORMAL HIGH (ref 11.0–14.6)
WBC: 6.4 10*3/uL (ref 4.0–10.3)
lymph#: 0.8 10*3/uL — ABNORMAL LOW (ref 0.9–3.3)

## 2014-07-12 MED ORDER — OXYCODONE-ACETAMINOPHEN 5-325 MG PO TABS: 1.0000 | ORAL_TABLET | ORAL | Status: DC | PRN

## 2014-07-12 MED ORDER — DENOSUMAB 120 MG/1.7ML ~~LOC~~ SOLN
120.0000 mg | Freq: Once | SUBCUTANEOUS | Status: AC
  Administered 2014-07-12: 120 mg via SUBCUTANEOUS
  Filled 2014-07-12: qty 1.7

## 2014-07-12 MED ORDER — SENNOSIDES-DOCUSATE SODIUM 8.6-50 MG PO TABS: 1.0000 | ORAL_TABLET | Freq: Every day | ORAL | Status: AC

## 2014-07-12 NOTE — Telephone Encounter (Signed)
Received message from patient's wife at 22 requesting a refill of his oxycodone. Phoned patient back explaining Dr. Tammi Klippel would return to the office on Thursday and this RN would call when the script is ready for pick up. Wife verbalized understanding and expressed appreciation for the return call.

## 2014-07-12 NOTE — Progress Notes (Signed)
Hematology and Oncology Follow Up Visit  Marcus [name] [id number] 01-Apr-1945 69 y.o. 07/12/2014 12:03 PM   Principle Diagnosis: 69 year old gentleman diagnosed with prostate cancer in 2005. 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   Prior Therapy:  He is status post prostatectomy and subsequently was treated with salvage radiation therapy in 2007.  In February of 2013 his PSA was up to 22.5 and was started on androgen deprivation.  He developed castration resistant disease with documented bony metastasis despite combined androgen depravation with Lupron and Casodex. His last bone scan done on 09/18/2012 which showed progression of disease. Zytiga a total of 1000 mg started in August of 2014 for the PSA of 32.44. He was discontinued in November 2015 due to a rise in his PSA Xtandi 160 mg daily started in November 2015. Stopped on 05/13/2014 due to progression of disease.   Current therapy:   Xofigo under the care of Dr. Tammi Klippel. First treatment given 06/09/2014. He is S/P 1 out of 6. He is continued on Lupron at [facility] urology. His Xgeva to be given and the [place] since 06/2013.   Interim History:  Marcus [name] presents today for a followup visit with his wife. Since his last visit, he started [place] without any complications. He is scheduled for his second infusion this week. His pain seems to be under excellent control for the time being. He has report some occasional constipation and takes MiraLAX intermittently without any stool softeners. He did have a bowel movement today that have been very small. He does not report any abdominal pain or distention. He continues to overall have excellent performance status and quality of life. He did not report any nausea or vomiting. Did not report any urinary symptoms of hematuria dysuria or incontinence. He did not report any other musculoskeletal pain arthralgias or myalgias. Did not report any lower  extremity swelling. He has not reported any neurological symptoms including headaches blurred vision or seizures. He has not reported any chest pain or difficulty breathing. Has not reported any cough or hemoptysis or wheezing. Has not reported any major changes in his bowel habits. He did not report any skin changes or petechiae. Has not reported any lymphadenopathy. He has not reported any pathological fractures or hospitalizations. Remainder of his review of system is unremarkable.  Medications: I have reviewed the patient's current medications.  Current Outpatient Prescriptions  Medication Sig Dispense Refill  . cholecalciferol (VITAMIN D) 1000 UNITS tablet Take 1,000 Units by mouth daily.    Marland Kitchen lisinopril-hydrochlorothiazide (PRINZIDE,ZESTORETIC) 20-25 MG per tablet TAKE ONE TABLET BY MOUTH ONCE DAILY 90 tablet 0  . megestrol (MEGACE) 40 MG/ML suspension Take 10 mLs (400 mg total) by mouth daily. 480 mL 0  . morphine (MS CONTIN) 30 MG 12 hr tablet Take 1 tablet (30 mg total) by mouth every 12 (twelve) hours. 60 tablet 0  . prochlorperazine (COMPAZINE) 10 MG tablet Take 1 tablet (10 mg total) by mouth every 6 (six) hours as needed for nausea or vomiting. 30 tablet 1  . XOFIGO 27 MCCI/ML SOLN Inject 50 KBq/kg into the vein every 30 (thirty) days. For 6 monthly infusions in nuclear medicine 6 each 0  . XTANDI 40 MG capsule     . oxyCODONE-acetaminophen (PERCOCET/ROXICET) 5-325 MG per tablet Take 1-2 tablets by mouth every 4 (four) hours as needed for severe pain (Do not take more than 12 tablets per 24 hours.). 120 tablet 0  . senna-docusate (SENNA S)  8.6-50 MG per tablet Take 1 tablet by mouth at bedtime. 30 tablet 3   No current facility-administered medications for this visit.     Allergies: No Known Allergies   Physical Exam: Blood pressure 110/73, pulse 66, temperature 98.3 F (36.8 C), temperature source Oral, resp. rate 18, height 6\' 2"  (1.88 m), weight 196 lb 9.6 oz (89.177 kg), SpO2  100 %. ECOG: 0 General appearance: alert and awake not in any distress. Well appearing.  Head: Normocephalic, without obvious abnormality Neck: no adenopathy and No masses or thyromegaly. Lymph nodes: Cervical, supraclavicular, and axillary nodes normal. Heart:regular rate and rhythm, S1, S2. No murmurs rubs or gallops. Lung:chest clear, no wheezing, rales.  Abdomen: soft, non-tender, without masses or organomegaly no shifting dullness EXT:no erythema, induration, no edema noted. Neurological: No deficits.    Lab Results: Lab Results  Component Value Date   WBC 6.4 07/12/2014   HGB 8.9* 07/12/2014   HCT 27.1* 07/12/2014   MCV 83.1 07/12/2014   PLT 230 07/12/2014     Chemistry      Component Value Date/Time   NA 136 06/14/2014 1108   NA 139 05/25/2012 1522   K 4.1 06/14/2014 1108   K 4.2 05/25/2012 1522   CL 104 05/25/2012 1522   CO2 25 06/14/2014 1108   CO2 27 05/25/2012 1522   BUN 17.0 06/14/2014 1108   BUN 17 05/25/2012 1522   CREATININE 1.1 06/14/2014 1108   CREATININE 1.37* 05/25/2012 1522      Component Value Date/Time   CALCIUM 8.6 06/14/2014 1108   CALCIUM 11.0* 05/25/2012 1522   ALKPHOS 192* 06/14/2014 1108   ALKPHOS 78 05/25/2012 1522   AST 22 06/14/2014 1108   AST 31 05/25/2012 1522   ALT 18 06/14/2014 1108   ALT 37 05/25/2012 1522   BILITOT 0.36 06/14/2014 1108   BILITOT 0.4 05/25/2012 1522        Results for Marcus [name] (MRN [id number]) as of 07/12/2014 11:50  Ref. Range 04/13/2014 10:48 05/11/2014 09:07 06/14/2014 11:08  PSA Latest Ref Range: <=4.00 ng/mL 47.84 (H) 81.50 (H) 209.00 (H)         Impression and Plan:  69 year old gentleman with the following issues:  1. Castration resistant prostate cancer with metastatic disease to the bone. He has developed castration resistant disease and have progressed on both Zytiga and most recently on xtandi. His most recent PSA was up to 209.  He is status post 1 of 6 planned treatments with  Xofigo under the care of Dr. Tammi Klippel. He tolerated the treatment without any difficulty. His PSA and alkaline phosphatase are currently pending from today and I will continue to monitor this on a regular basis. The plan is to proceed with total of 6 treatments last he has rapid progression of his disease before that. He refused systemic chemotherapy in the past but would be an option salvage therapy moving forward.   2. Androgen depravation: His last testosterone is 91 in December 2015 and will be repeated in the future and administer Lupron as needed.  3. Bone directed therapy: He is on Xgeva monthly which he will be receiving here at this time. He will continue calcium and vitamin D supplement as well as continuous education about osteonecrosis of the jaw. He has no issues continuing Xgeva for now.   4.Right thigh pain/back pain: He will continue on Percocet which seems to have helped his pain at this time.  5. Constipation: I gave him a prescription for Senokot-S  to take on a daily basis as long as he is taking pain medication. I also gave him clear instructions on maintaining hydration as well as increase the fiber in his diet.  5. Followup: Will be in about 4 to 5 weeks.  VNRWCH,JSCBI PA-C  6/21/201612:03 PM

## 2014-07-12 NOTE — Telephone Encounter (Signed)
Appointments made and avs printed for patient °

## 2014-07-13 ENCOUNTER — Other Ambulatory Visit: Payer: Self-pay | Admitting: Radiation Oncology

## 2014-07-13 ENCOUNTER — Telehealth: Payer: Self-pay | Admitting: *Deleted

## 2014-07-13 DIAGNOSIS — C7951 Secondary malignant neoplasm of bone: Secondary | ICD-10-CM

## 2014-07-13 DIAGNOSIS — C61 Malignant neoplasm of prostate: Secondary | ICD-10-CM

## 2014-07-13 LAB — PSA: PSA: 197.4 ng/mL — AB (ref <=4.00)

## 2014-07-13 MED ORDER — OXYCODONE-ACETAMINOPHEN 5-325 MG PO TABS: 1.0000 | ORAL_TABLET | ORAL | Status: DC | PRN

## 2014-07-13 NOTE — Telephone Encounter (Signed)
Called patient to remind of Xofigo Injection- arrival time 12:45 pm on 07-14-14 @ [facility], spoke with patient and he is aware of this injection.

## 2014-07-14 ENCOUNTER — Ambulatory Visit (HOSPITAL_COMMUNITY)
Admission: RE | Admit: 2014-07-14 | Discharge: 2014-07-14 | Disposition: A | Discharge status: Home / Self Care | Payer: Medicare Other | Source: Ambulatory Visit | Attending: Radiation Oncology | Admitting: Radiation Oncology

## 2014-07-14 DIAGNOSIS — C61 Malignant neoplasm of prostate: Secondary | ICD-10-CM

## 2014-07-14 DIAGNOSIS — C7951 Secondary malignant neoplasm of bone: Secondary | ICD-10-CM | POA: Insufficient documentation

## 2014-07-14 NOTE — Progress Notes (Signed)
  Radiation Oncology         [phone] ________________________________  Name: Marcus [name] MRN: [id number]  Date: 07/14/2014  DOB: 10/09/1945  Radium-[facility] Infusion Note  Diagnosis:  Castration resistant prostate cancer with painful bone involvement  Current Infusion:    2  Planned Infusions:  6  Narrative: Marcus [name] presented to nuclear medicine for treatment. His most recent blood counts were reviewed.  He remains a good candidate to proceed with Ra-223.  The patient was situated in an infusion suite with a contact barrier placed under his arm. Intravenous access was established, using sterile technique, and a normal saline infusion from a syringe was started.  Micro-dosimetry:  The prescribed radiation activity was assayed and confirmed to be within specified tolerance.  Special Treatment Procedure - Infusion:  The nuclear medicine technologist and I personally verified the dose activity to be delivered as specified in the written directive, and verified the patient identification via 2 separate methods.  The syringe containing the dose was attached to a 3 way stopcock, and then the valve was opened to the patient, and the dose delivered over a minute. No complications were noted.  The total administered dose was 114.2 microcuries in a volume of 10cc.   A saline flush of the line and the syringe that contained the isotope was then performed.  The residual radioactivity in the syringe was 3.1 microcuries, so the actual infused isotope activity was 111.1 microcuries.   Pressure was applied to the venipuncture site, and a compression bandage placed.   Radiation Safety personnel were present to perform the discharge survey, as detailed on their documentation.   After a short period of observation, the patient had his IV removed.  Impression:  The patient tolerated his infusion relatively well.  Plan:  The patient will return in one month for ongoing care.      ________________________________  Sheral Apley. Tammi Klippel, M.D.

## 2014-07-20 ENCOUNTER — Other Ambulatory Visit: Payer: Self-pay | Admitting: Oncology

## 2014-07-21 ENCOUNTER — Other Ambulatory Visit: Payer: Self-pay | Admitting: Oncology

## 2014-07-21 ENCOUNTER — Other Ambulatory Visit: Payer: Self-pay | Admitting: Radiation Oncology

## 2014-07-21 DIAGNOSIS — C7951 Secondary malignant neoplasm of bone: Principal | ICD-10-CM

## 2014-07-21 DIAGNOSIS — C61 Malignant neoplasm of prostate: Secondary | ICD-10-CM

## 2014-07-22 ENCOUNTER — Telehealth: Payer: Self-pay | Admitting: *Deleted

## 2014-07-22 NOTE — Telephone Encounter (Signed)
CALLED PATIENT TO INFORM OF XOFIGO INJECTION ON 08/18/14, LVM FOR A RETURN CALL

## 2014-08-02 ENCOUNTER — Telehealth: Payer: Self-pay | Admitting: *Deleted

## 2014-08-02 ENCOUNTER — Telehealth: Payer: Self-pay | Admitting: Radiation Oncology

## 2014-08-02 ENCOUNTER — Other Ambulatory Visit: Payer: Self-pay | Admitting: Oncology

## 2014-08-02 ENCOUNTER — Other Ambulatory Visit: Payer: Self-pay | Admitting: Radiation Oncology

## 2014-08-02 DIAGNOSIS — C61 Malignant neoplasm of prostate: Secondary | ICD-10-CM

## 2014-08-02 DIAGNOSIS — C7951 Secondary malignant neoplasm of bone: Secondary | ICD-10-CM

## 2014-08-02 MED ORDER — MORPHINE SULFATE ER 30 MG PO TBCR: 30.0000 mg | EXTENDED_RELEASE_TABLET | Freq: Two times a day (BID) | ORAL | Status: DC

## 2014-08-02 NOTE — Telephone Encounter (Signed)
CALLED PATIENT TO INFORM OF LAB AND XOFIGO INJ., LVM FOR A RETURN CALL 

## 2014-08-02 NOTE — Telephone Encounter (Signed)
Informed by Thayer Headings, RN the patient left a message on her phone requesting morphine refill. Phoned patient's home. No answer. Left message explaining his request had been routed to Dr. Tammi Klippel.

## 2014-08-04 ENCOUNTER — Other Ambulatory Visit: Payer: Self-pay | Admitting: *Deleted

## 2014-08-04 DIAGNOSIS — C61 Malignant neoplasm of prostate: Secondary | ICD-10-CM

## 2014-08-04 MED ORDER — PROCHLORPERAZINE MALEATE 10 MG PO TABS: ORAL_TABLET | ORAL | Status: DC

## 2014-08-04 NOTE — Telephone Encounter (Signed)
Refill: Compazine. Patient notified.

## 2014-08-05 ENCOUNTER — Telehealth: Payer: Self-pay | Admitting: *Deleted

## 2014-08-05 NOTE — Telephone Encounter (Signed)
Called patient to give answer to question, lvm for a return call

## 2014-08-09 ENCOUNTER — Ambulatory Visit (HOSPITAL_BASED_OUTPATIENT_CLINIC_OR_DEPARTMENT_OTHER): Payer: Medicare Other

## 2014-08-09 ENCOUNTER — Telehealth: Payer: Self-pay | Admitting: Oncology

## 2014-08-09 ENCOUNTER — Encounter: Payer: Self-pay | Admitting: Physician Assistant

## 2014-08-09 ENCOUNTER — Ambulatory Visit (HOSPITAL_BASED_OUTPATIENT_CLINIC_OR_DEPARTMENT_OTHER): Payer: Medicare Other | Admitting: Physician Assistant

## 2014-08-09 ENCOUNTER — Other Ambulatory Visit (HOSPITAL_BASED_OUTPATIENT_CLINIC_OR_DEPARTMENT_OTHER): Payer: Medicare Other

## 2014-08-09 ENCOUNTER — Telehealth: Payer: Self-pay | Admitting: Radiation Oncology

## 2014-08-09 VITALS — BP 121/65 | HR 60 | Temp 98.4°F | Resp 17 | Ht 74.0 in | Wt 197.4 lb

## 2014-08-09 DIAGNOSIS — C7951 Secondary malignant neoplasm of bone: Secondary | ICD-10-CM

## 2014-08-09 DIAGNOSIS — C61 Malignant neoplasm of prostate: Secondary | ICD-10-CM

## 2014-08-09 LAB — COMPREHENSIVE METABOLIC PANEL (CC13)
ALT: 9 U/L (ref 0–55)
AST: 14 U/L (ref 5–34)
Albumin: 3.3 g/dL — ABNORMAL LOW (ref 3.5–5.0)
Alkaline Phosphatase: 139 U/L (ref 40–150)
Anion Gap: 7 mEq/L (ref 3–11)
BUN: 17.9 mg/dL (ref 7.0–26.0)
CO2: 23 mEq/L (ref 22–29)
Calcium: 9.8 mg/dL (ref 8.4–10.4)
Chloride: 109 mEq/L (ref 98–109)
Creatinine: 1.2 mg/dL (ref 0.7–1.3)
EGFR: 71 mL/min/{1.73_m2} — ABNORMAL LOW (ref >90)
GLUCOSE: 100 mg/dL (ref 70–140)
POTASSIUM: 4.4 meq/L (ref 3.5–5.1)
Sodium: 138 mEq/L (ref 136–145)
Total Bilirubin: 0.28 mg/dL (ref 0.20–1.20)
Total Protein: 6.3 g/dL — ABNORMAL LOW (ref 6.4–8.3)

## 2014-08-09 LAB — CBC WITH DIFFERENTIAL/PLATELET
BASO%: 0.9 % (ref 0.0–2.0)
BASOS ABS: 0 10*3/uL (ref 0.0–0.1)
EOS ABS: 0.4 10*3/uL (ref 0.0–0.5)
EOS%: 7.7 % — ABNORMAL HIGH (ref 0.0–7.0)
HEMATOCRIT: 26.2 % — AB (ref 38.4–49.9)
HGB: 8.6 g/dL — ABNORMAL LOW (ref 13.0–17.1)
LYMPH%: 17.2 % (ref 14.0–49.0)
MCH: 28.4 pg (ref 27.2–33.4)
MCHC: 32.8 g/dL (ref 32.0–36.0)
MCV: 86.6 fL (ref 79.3–98.0)
MONO#: 0.6 10*3/uL (ref 0.1–0.9)
MONO%: 11.1 % (ref 0.0–14.0)
NEUT#: 3.4 10*3/uL (ref 1.5–6.5)
NEUT%: 63.1 % (ref 39.0–75.0)
Platelets: 250 10*3/uL (ref 140–400)
RBC: 3.03 10*6/uL — AB (ref 4.20–5.82)
RDW: 22.8 % — ABNORMAL HIGH (ref 11.0–14.6)
WBC: 5.5 10*3/uL (ref 4.0–10.3)
lymph#: 0.9 10*3/uL (ref 0.9–3.3)

## 2014-08-09 MED ORDER — DENOSUMAB 120 MG/1.7ML ~~LOC~~ SOLN
120.0000 mg | Freq: Once | SUBCUTANEOUS | Status: AC
  Administered 2014-08-09: 120 mg via SUBCUTANEOUS
  Filled 2014-08-09: qty 1.7

## 2014-08-09 NOTE — Telephone Encounter (Signed)
Placed most recent lab work and complete xofigo template on Dr. Johny Shears desk to review.

## 2014-08-09 NOTE — Progress Notes (Signed)
Hematology and Oncology Follow Up Visit  Marcus [name] [id number] 07-04-1945 69 y.o. 08/09/2014 4:19 PM   Principle Diagnosis: 69 year old gentleman diagnosed with prostate cancer in 2005. 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   Prior Therapy:  He is status post prostatectomy and subsequently was treated with salvage radiation therapy in 2007.  In February of 2013 his PSA was up to 22.5 and was started on androgen deprivation.  He developed castration resistant disease with documented bony metastasis despite combined androgen depravation with Lupron and Casodex. His last bone scan done on 09/18/2012 which showed progression of disease. Zytiga a total of 1000 mg started in August of 2014 for the PSA of 32.44. He was discontinued in November 2015 due to a rise in his PSA Xtandi 160 mg daily started in November 2015. Stopped on 05/13/2014 due to progression of disease.   Current therapy:   Xofigo under the care of Dr. Tammi Klippel. First treatment given 06/09/2014. He is S/P 2 out of 6. He is continued on Lupron at [facility] urology. His Xgeva to be given and the [place] since 06/2013.   Interim History:  Mr. Marcus [name] presents today for a followup visit with his wife. Since his last visit, he started [place] without any complications. He is now status post 2 infusions. He reports having some decreased episode appetite and a few episodes of nausea and vomiting. He requests refill for his Compazine. He is also requesting a refill your for his oxycodone however, Dr. Tammi Klippel is currently riding his pain medication. He reports some constipation. He is advised to use MiraLax in the morning and take 2 Senokot S at bedtime.  He does not report any abdominal pain or distention. He continues to overall have excellent performance status and quality of life. He did not report any nausea or vomiting. Did not report any urinary symptoms of hematuria dysuria or  incontinence. He did not report any other musculoskeletal pain arthralgias or myalgias. Did not report any lower extremity swelling. He has not reported any neurological symptoms including headaches blurred vision or seizures. He has not reported any chest pain or difficulty breathing. Has not reported any cough or hemoptysis or wheezing. Has not reported any major changes in his bowel habits. He did not report any skin changes or petechiae. Has not reported any lymphadenopathy. He has not reported any pathological fractures or hospitalizations. Remainder of his review of system is unremarkable.  Medications: I have reviewed the patient's current medications.  Current Outpatient Prescriptions  Medication Sig Dispense Refill  . cholecalciferol (VITAMIN D) 1000 UNITS tablet Take 1,000 Units by mouth daily.    Marland Kitchen lisinopril-hydrochlorothiazide (PRINZIDE,ZESTORETIC) 20-25 MG per tablet TAKE ONE TABLET BY MOUTH ONCE DAILY 90 tablet 0  . megestrol (MEGACE) 40 MG/ML suspension Take 10 mLs (400 mg total) by mouth daily. 480 mL 0  . morphine (MS CONTIN) 30 MG 12 hr tablet Take 1 tablet (30 mg total) by mouth every 12 (twelve) hours. 60 tablet 0  . oxyCODONE-acetaminophen (PERCOCET/ROXICET) 5-325 MG per tablet Take 1-2 tablets by mouth every 4 (four) hours as needed for severe pain (Do not take more than 12 tablets per 24 hours.). 120 tablet 0  . prochlorperazine (COMPAZINE) 10 MG tablet TAKE ONE TABLET BY MOUTH EVERY 6 HOURS AS NEEDED FOR NAUSEA AND VOMITING 30 tablet 0  . senna-docusate (SENNA S) 8.6-50 MG per tablet Take 1 tablet by mouth at bedtime. 30 tablet 3  . XOFIGO 27  MCCI/ML SOLN Inject 50 KBq/kg into the vein every 30 (thirty) days. For 6 monthly infusions in nuclear medicine 6 each 0  . XTANDI 40 MG capsule      No current facility-administered medications for this visit.     Allergies: No Known Allergies   Physical Exam: Blood pressure 121/65, pulse 60, temperature 98.4 F (36.9 C),  temperature source Oral, resp. rate 17, height 6\' 2"  (1.88 m), weight 197 lb 6.4 oz (89.54 kg), SpO2 100 %. ECOG: 0 General appearance: alert and awake not in any distress. Well appearing.  Head: Normocephalic, without obvious abnormality Neck: no adenopathy and No masses or thyromegaly. Lymph nodes: Cervical, supraclavicular, and axillary nodes normal. Heart:regular rate and rhythm, S1, S2. No murmurs rubs or gallops. Lung:chest clear, no wheezing, rales.  Abdomen: soft, non-tender, without masses or organomegaly no shifting dullness EXT:no erythema, induration, no edema noted. Neurological: No deficits.    Lab Results: Lab Results  Component Value Date   WBC 5.5 08/09/2014   HGB 8.6* 08/09/2014   HCT 26.2* 08/09/2014   MCV 86.6 08/09/2014   PLT 250 08/09/2014     Chemistry      Component Value Date/Time   NA 138 08/09/2014 1006   NA 139 05/25/2012 1522   K 4.4 08/09/2014 1006   K 4.2 05/25/2012 1522   CL 104 05/25/2012 1522   CO2 23 08/09/2014 1006   CO2 27 05/25/2012 1522   BUN 17.9 08/09/2014 1006   BUN 17 05/25/2012 1522   CREATININE 1.2 08/09/2014 1006   CREATININE 1.37* 05/25/2012 1522      Component Value Date/Time   CALCIUM 9.8 08/09/2014 1006   CALCIUM 11.0* 05/25/2012 1522   ALKPHOS 139 08/09/2014 1006   ALKPHOS 78 05/25/2012 1522   AST 14 08/09/2014 1006   AST 31 05/25/2012 1522   ALT 9 08/09/2014 1006   ALT 37 05/25/2012 1522   BILITOT 0.28 08/09/2014 1006   BILITOT 0.4 05/25/2012 1522        Results for CASSEY, HURRELL (MRN [id number]) as of 07/12/2014 11:50  Ref. Range 04/13/2014 10:48 05/11/2014 09:07 06/14/2014 11:08  PSA Latest Ref Range: <=4.00 ng/mL 47.84 (H) 81.50 (H) 209.00 (H)         Impression and Plan:  69 year old gentleman with the following issues:  1. Castration resistant prostate cancer with metastatic disease to the bone. He has developed castration resistant disease and have progressed on both Zytiga and most recently on  xtandi. His most recent PSA was up to 209.  He is status post 1 of 6 planned treatments with Xofigo under the care of Dr. Tammi Klippel. He tolerated the treatment without any difficulty. His PSA and alkaline phosphatase are currently pending from today and I will continue to monitor this on a regular basis. The plan is to proceed with total of 6 treatments last he has rapid progression of his disease before that. He refused systemic chemotherapy in the past but would be an option salvage therapy moving forward.   2. Androgen depravation: His last testosterone is 91 in December 2015 and will be repeated in the future and administer Lupron as needed.  3. Bone directed therapy: He is on Xgeva monthly which he will be receiving here at this time. He will continue calcium and vitamin D supplement as well as continuous education about osteonecrosis of the jaw. He has no issues continuing Xgeva for now.   4.Right thigh pain/back pain: He will continue on Percocet which seems to  have helped his pain at this time. His pain medication is being written by Dr. Tammi Klippel at this time.  5. Constipation: MiraLax in the morning and continue Senokot S, 2 at bedtime as long as he is taking pain medication.He is to continue to maintain hydration as well as increase the fiber in his diet.  5. Followup: Will be in about 4 to 5 weeks.  Awilda Metro E PA-C  7/19/20164:19 PM

## 2014-08-09 NOTE — Telephone Encounter (Signed)
per pof to sch pt appt-gave pt copy of avs °

## 2014-08-10 ENCOUNTER — Telehealth: Payer: Self-pay | Admitting: Radiation Oncology

## 2014-08-10 ENCOUNTER — Other Ambulatory Visit: Payer: Self-pay | Admitting: Radiation Oncology

## 2014-08-10 DIAGNOSIS — C61 Malignant neoplasm of prostate: Secondary | ICD-10-CM

## 2014-08-10 DIAGNOSIS — C7951 Secondary malignant neoplasm of bone: Secondary | ICD-10-CM

## 2014-08-10 LAB — PSA: PSA: 196.3 ng/mL — ABNORMAL HIGH (ref <=4.00)

## 2014-08-10 MED ORDER — OXYCODONE-ACETAMINOPHEN 5-325 MG PO TABS: 1.0000 | ORAL_TABLET | ORAL | Status: DC | PRN

## 2014-08-10 NOTE — Telephone Encounter (Signed)
Phoned patient making him aware his Percocet script is ready for pick up in the radiation oncology nursing area. Patient verbalized understanding. 

## 2014-08-11 ENCOUNTER — Ambulatory Visit: Payer: Medicare Other

## 2014-08-11 NOTE — Patient Instructions (Signed)
Follow up in one month.

## 2014-08-17 ENCOUNTER — Telehealth: Payer: Self-pay | Admitting: *Deleted

## 2014-08-17 NOTE — Telephone Encounter (Signed)
Called patient to remind of Xofigo Inj. For 08-18-14, spoke with patient and he is aware of this injection

## 2014-08-18 ENCOUNTER — Ambulatory Visit (HOSPITAL_COMMUNITY)
Admission: RE | Admit: 2014-08-18 | Discharge: 2014-08-18 | Disposition: A | Discharge status: Home / Self Care | Payer: Medicare Other | Source: Ambulatory Visit | Attending: Radiation Oncology | Admitting: Radiation Oncology

## 2014-08-18 DIAGNOSIS — C7951 Secondary malignant neoplasm of bone: Secondary | ICD-10-CM | POA: Insufficient documentation

## 2014-08-18 DIAGNOSIS — C61 Malignant neoplasm of prostate: Secondary | ICD-10-CM | POA: Diagnosis not present

## 2014-08-25 ENCOUNTER — Other Ambulatory Visit: Payer: Self-pay | Admitting: Radiation Oncology

## 2014-08-25 DIAGNOSIS — C7951 Secondary malignant neoplasm of bone: Principal | ICD-10-CM

## 2014-08-25 DIAGNOSIS — C61 Malignant neoplasm of prostate: Secondary | ICD-10-CM

## 2014-08-26 ENCOUNTER — Telehealth: Payer: Self-pay | Admitting: *Deleted

## 2014-08-26 NOTE — Telephone Encounter (Signed)
CALLED PATIENT TO INFORM OF XOFIGO INJ. ON 09-15-14, SPOKE WITH PATIENT AND HE IS AWARE OF THIS INJ.

## 2014-09-05 ENCOUNTER — Other Ambulatory Visit: Payer: Self-pay | Admitting: Radiation Oncology

## 2014-09-05 DIAGNOSIS — C7951 Secondary malignant neoplasm of bone: Secondary | ICD-10-CM

## 2014-09-05 DIAGNOSIS — C61 Malignant neoplasm of prostate: Secondary | ICD-10-CM

## 2014-09-05 MED ORDER — MORPHINE SULFATE ER 30 MG PO TBCR: 30.0000 mg | EXTENDED_RELEASE_TABLET | Freq: Two times a day (BID) | ORAL | Status: DC

## 2014-09-05 MED ORDER — OXYCODONE-ACETAMINOPHEN 5-325 MG PO TABS: 1.0000 | ORAL_TABLET | ORAL | Status: DC | PRN

## 2014-09-06 ENCOUNTER — Ambulatory Visit (HOSPITAL_BASED_OUTPATIENT_CLINIC_OR_DEPARTMENT_OTHER): Payer: Medicare Other

## 2014-09-06 ENCOUNTER — Other Ambulatory Visit (HOSPITAL_BASED_OUTPATIENT_CLINIC_OR_DEPARTMENT_OTHER): Payer: Medicare Other

## 2014-09-06 ENCOUNTER — Ambulatory Visit (HOSPITAL_BASED_OUTPATIENT_CLINIC_OR_DEPARTMENT_OTHER): Payer: Medicare Other | Admitting: Oncology

## 2014-09-06 ENCOUNTER — Encounter: Payer: Self-pay | Admitting: Radiation Oncology

## 2014-09-06 ENCOUNTER — Telehealth: Payer: Self-pay | Admitting: Oncology

## 2014-09-06 VITALS — BP 118/67 | HR 68 | Temp 98.8°F | Resp 18 | Ht 74.0 in | Wt 199.4 lb

## 2014-09-06 DIAGNOSIS — E291 Testicular hypofunction: Secondary | ICD-10-CM

## 2014-09-06 DIAGNOSIS — K5900 Constipation, unspecified: Secondary | ICD-10-CM

## 2014-09-06 DIAGNOSIS — C61 Malignant neoplasm of prostate: Secondary | ICD-10-CM

## 2014-09-06 DIAGNOSIS — C7951 Secondary malignant neoplasm of bone: Secondary | ICD-10-CM

## 2014-09-06 DIAGNOSIS — M79651 Pain in right thigh: Secondary | ICD-10-CM

## 2014-09-06 DIAGNOSIS — M549 Dorsalgia, unspecified: Secondary | ICD-10-CM

## 2014-09-06 LAB — COMPREHENSIVE METABOLIC PANEL (CC13)
ALT: 15 U/L (ref 0–55)
ANION GAP: 7 meq/L (ref 3–11)
AST: 12 U/L (ref 5–34)
Albumin: 3.4 g/dL — ABNORMAL LOW (ref 3.5–5.0)
Alkaline Phosphatase: 91 U/L (ref 40–150)
BILIRUBIN TOTAL: 0.24 mg/dL (ref 0.20–1.20)
BUN: 14.8 mg/dL (ref 7.0–26.0)
CHLORIDE: 110 meq/L — AB (ref 98–109)
CO2: 22 meq/L (ref 22–29)
CREATININE: 1.1 mg/dL (ref 0.7–1.3)
Calcium: 9.9 mg/dL (ref 8.4–10.4)
EGFR: 77 mL/min/{1.73_m2} — ABNORMAL LOW (ref >90)
GLUCOSE: 113 mg/dL (ref 70–140)
Potassium: 4.2 mEq/L (ref 3.5–5.1)
SODIUM: 139 meq/L (ref 136–145)
TOTAL PROTEIN: 6.4 g/dL (ref 6.4–8.3)

## 2014-09-06 LAB — CBC WITH DIFFERENTIAL/PLATELET
BASO%: 0.7 % (ref 0.0–2.0)
Basophils Absolute: 0 10*3/uL (ref 0.0–0.1)
EOS%: 14.8 % — AB (ref 0.0–7.0)
Eosinophils Absolute: 0.9 10*3/uL — ABNORMAL HIGH (ref 0.0–0.5)
HCT: 27.3 % — ABNORMAL LOW (ref 38.4–49.9)
HGB: 9 g/dL — ABNORMAL LOW (ref 13.0–17.1)
LYMPH%: 13.3 % — AB (ref 14.0–49.0)
MCH: 29.7 pg (ref 27.2–33.4)
MCHC: 32.8 g/dL (ref 32.0–36.0)
MCV: 90.3 fL (ref 79.3–98.0)
MONO#: 0.5 10*3/uL (ref 0.1–0.9)
MONO%: 7.9 % (ref 0.0–14.0)
NEUT%: 63.3 % (ref 39.0–75.0)
NEUTROS ABS: 3.7 10*3/uL (ref 1.5–6.5)
Platelets: 268 10*3/uL (ref 140–400)
RBC: 3.02 10*6/uL — AB (ref 4.20–5.82)
RDW: 18.7 % — ABNORMAL HIGH (ref 11.0–14.6)
WBC: 5.8 10*3/uL (ref 4.0–10.3)
lymph#: 0.8 10*3/uL — ABNORMAL LOW (ref 0.9–3.3)

## 2014-09-06 MED ORDER — DENOSUMAB 120 MG/1.7ML ~~LOC~~ SOLN
120.0000 mg | Freq: Once | SUBCUTANEOUS | Status: AC
  Administered 2014-09-06: 120 mg via SUBCUTANEOUS
  Filled 2014-09-06: qty 1.7

## 2014-09-06 NOTE — Progress Notes (Signed)
Hematology and Oncology Follow Up Visit  Marcus [name] [id number] Jun 11, 1945 69 y.o. 09/06/2014 1:16 PM   Principle Diagnosis: 69 year old gentleman diagnosed with prostate cancer in 2005. 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   Prior Therapy:  He is status post prostatectomy and subsequently was treated with salvage radiation therapy in 2007.  In February of 2013 his PSA was up to 22.5 and was started on androgen deprivation.  He developed castration resistant disease with documented bony metastasis despite combined androgen depravation with Lupron and Casodex. His last bone scan done on 09/18/2012 which showed progression of disease. Zytiga a total of 1000 mg started in August of 2014 for the PSA of 32.44. He was discontinued in November 2015 due to a rise in his PSA Xtandi 160 mg daily started in November 2015. Stopped on 05/13/2014 due to progression of disease.   Current therapy:   Xofigo under the care of Dr. Tammi Klippel. First treatment given 06/09/2014. He is S/P 3 out of 6. He is continued on Lupron at [facility] urology. His Xgeva to be given and the [facility] since 06/2013.   Interim History:  Marcus [name] presents today for a followup visit with his wife. Since his last visit, he continues to be on Xofigo without any complications. He did have some slight alteration in his taste but despite that is able to eat better and has gained 2 pounds. He reports some constipation. He is advised to use MiraLax in the morning and take 2 Senokot S at bedtime which helped resolved his constipation issues.   His pain has been under excellent control with long-acting morphine and breakthrough oxycodone. He takes oxycodone about every 5-6 hours with very little breakthrough otherwise.  He does not report any abdominal pain or distention.He did not report any nausea or vomiting. Did not report any urinary symptoms of hematuria dysuria or incontinence. He  did not report any other musculoskeletal pain arthralgias or myalgias. Did not report any lower extremity swelling. He has not reported any neurological symptoms including headaches blurred vision or seizures. He has not reported any chest pain or difficulty breathing. Has not reported any cough or hemoptysis or wheezing. Has not reported any major changes in his bowel habits. He did not report any skin changes or petechiae. Has not reported any lymphadenopathy. He has not reported any pathological fractures or hospitalizations. Remainder of his review of system is unremarkable.  Medications: I have reviewed the patient's current medications.  Current Outpatient Prescriptions  Medication Sig Dispense Refill  . cholecalciferol (VITAMIN D) 1000 UNITS tablet Take 1,000 Units by mouth daily.    Marland Kitchen lisinopril-hydrochlorothiazide (PRINZIDE,ZESTORETIC) 20-25 MG per tablet TAKE ONE TABLET BY MOUTH ONCE DAILY 90 tablet 0  . megestrol (MEGACE) 40 MG/ML suspension Take 10 mLs (400 mg total) by mouth daily. 480 mL 0  . morphine (MS CONTIN) 30 MG 12 hr tablet Take 1 tablet (30 mg total) by mouth every 12 (twelve) hours. 60 tablet 0  . oxyCODONE-acetaminophen (PERCOCET/ROXICET) 5-325 MG per tablet Take 1-2 tablets by mouth every 4 (four) hours as needed for severe pain (Do not take more than 12 tablets per 24 hours.). 120 tablet 0  . prochlorperazine (COMPAZINE) 10 MG tablet TAKE ONE TABLET BY MOUTH EVERY 6 HOURS AS NEEDED FOR NAUSEA AND VOMITING 30 tablet 0  . senna-docusate (SENNA S) 8.6-50 MG per tablet Take 1 tablet by mouth at bedtime. 30 tablet 3  . XOFIGO 27 MCCI/ML SOLN  Inject 50 KBq/kg into the vein every 30 (thirty) days. For 6 monthly infusions in nuclear medicine (Patient not taking: Reported on 09/06/2014) 6 each 0  . XTANDI 40 MG capsule      No current facility-administered medications for this visit.     Allergies: No Known Allergies   Physical Exam: Blood pressure 118/67, pulse 68,  temperature 98.8 F (37.1 C), temperature source Oral, resp. rate 18, height 6\' 2"  (1.88 m), weight 199 lb 6.4 oz (90.447 kg), SpO2 100 %. ECOG: 1 General appearance: alert and awake not in any distress.  Head: Normocephalic, without obvious abnormality Neck: no adenopathy and No masses or thyromegaly. Lymph nodes: Cervical, supraclavicular, and axillary nodes normal. Heart:regular rate and rhythm, S1, S2. No murmurs rubs or gallops. Lung:chest clear, no wheezing, rales.  Abdomen: soft, non-tender, without masses or organomegaly no shifting dullness EXT:no erythema, induration, no edema noted. Neurological: No deficits.    Lab Results: Lab Results  Component Value Date   WBC 5.8 09/06/2014   HGB 9.0* 09/06/2014   HCT 27.3* 09/06/2014   MCV 90.3 09/06/2014   PLT 268 09/06/2014     Chemistry      Component Value Date/Time   NA 138 08/09/2014 1006   NA 139 05/25/2012 1522   K 4.4 08/09/2014 1006   K 4.2 05/25/2012 1522   CL 104 05/25/2012 1522   CO2 23 08/09/2014 1006   CO2 27 05/25/2012 1522   BUN 17.9 08/09/2014 1006   BUN 17 05/25/2012 1522   CREATININE 1.2 08/09/2014 1006   CREATININE 1.37* 05/25/2012 1522      Component Value Date/Time   CALCIUM 9.8 08/09/2014 1006   CALCIUM 11.0* 05/25/2012 1522   ALKPHOS 139 08/09/2014 1006   ALKPHOS 78 05/25/2012 1522   AST 14 08/09/2014 1006   AST 31 05/25/2012 1522   ALT 9 08/09/2014 1006   ALT 37 05/25/2012 1522   BILITOT 0.28 08/09/2014 1006   BILITOT 0.4 05/25/2012 1522          Results for Marcus [name] (MRN [id number]) as of 09/06/2014 12:57  Ref. Range 06/14/2014 11:08 07/12/2014 11:33 08/09/2014 10:05  PSA Latest Ref Range: <=4.00 ng/mL 209.00 (H) 197.40 (H) 196.30 (H)        Impression and Plan:  69 year old gentleman with the following issues:  1. Castration resistant prostate cancer with metastatic disease to the bone. He has developed castration resistant disease and have progressed on both Zytiga  and most recently on xtandi. His most recent PSA was up to 209.  He is status post 3 of 6 planned treatments with Xofigo under the care of Dr. Tammi Klippel. He tolerated the treatment without any difficulty. His PSA and alkaline phosphatase have been declining indicating possible response to therapy. The plan is to continue with the same dose and schedule for a total of 6 treatments.  2. Androgen depravation: His last testosterone is 91 in December 2015 and will be repeated in the future and administer Lupron as needed.  3. Bone directed therapy: He is on Xgeva monthly which he will be receiving here at this time. He will continue calcium and vitamin D supplement as well as continuous education about osteonecrosis of the jaw. He has no issues continuing Xgeva for now.   4.Right thigh pain/back pain: His pain is well-controlled with long-acting morphine and breakthrough oxycodone.  5. Constipation: MiraLax in the morning and continue Senokot S, 2 at bedtime as long as he is taking pain medication.He is  to continue to maintain hydration as well as increase the fiber in his diet. His constipation issues have improved.  6. Followup: Will be in about 4 to 5 weeks.  [facility] MD 8/16/20161:16 PM

## 2014-09-06 NOTE — Telephone Encounter (Signed)
Gave patient avs report and appointments for September.  °

## 2014-09-06 NOTE — Addendum Note (Signed)
Addended by: Randolm Idol on: 09/06/2014 01:23 PM   Modules accepted: Orders, Medications

## 2014-09-06 NOTE — Progress Notes (Signed)
Place 09/06/14 lab work and weight on Dr. Johny Shears desk to review in preparation for 4th xofigo injection on 09/15/2014 at 1300.

## 2014-09-07 LAB — PSA: PSA: 106.5 ng/mL — ABNORMAL HIGH (ref <=4.00)

## 2014-09-14 ENCOUNTER — Other Ambulatory Visit: Payer: Self-pay | Admitting: Physician Assistant

## 2014-09-14 ENCOUNTER — Other Ambulatory Visit: Payer: Self-pay | Admitting: Oncology

## 2014-09-15 ENCOUNTER — Ambulatory Visit (HOSPITAL_COMMUNITY)
Admission: RE | Admit: 2014-09-15 | Discharge: 2014-09-15 | Disposition: A | Discharge status: Home / Self Care | Payer: Medicare Other | Source: Ambulatory Visit | Attending: Radiation Oncology | Admitting: Radiation Oncology

## 2014-09-15 ENCOUNTER — Other Ambulatory Visit: Payer: Self-pay | Admitting: *Deleted

## 2014-09-15 DIAGNOSIS — C7951 Secondary malignant neoplasm of bone: Secondary | ICD-10-CM | POA: Insufficient documentation

## 2014-09-15 DIAGNOSIS — C61 Malignant neoplasm of prostate: Secondary | ICD-10-CM

## 2014-09-15 MED ORDER — MEGESTROL ACETATE 40 MG/ML PO SUSP: 400.0000 mg | Freq: Every day | ORAL | Status: DC

## 2014-09-15 NOTE — Progress Notes (Signed)
  Radiation Oncology         [phone]) [phone] ________________________________  Name: Marcus [name] MRN: [id number]  Date: 09/15/2014  DOB: Jan 29, 1945  Radium-223 Infusion Note  Diagnosis:  Castration resistant prostate cancer with painful bone involvement  Current Infusion:    4  Planned Infusions:  6  Narrative: Mr. Marcus [name] presented to nuclear medicine for treatment. His most recent blood counts were reviewed.  He remains a good candidate to proceed with Ra-223.  The patient was situated in an infusion suite with a contact barrier placed under his arm. Intravenous access was established, using sterile technique, and a normal saline infusion from a syringe was started.  Micro-dosimetry:  The prescribed radiation activity was assayed and confirmed to be within specified tolerance.  Special Treatment Procedure - Infusion:  The nuclear medicine technologist and I personally verified the dose activity to be delivered as specified in the written directive, and verified the patient identification via 2 separate methods.  The syringe containing the dose was attached to a 3 way stopcock, and then the valve was opened to the patient, and the dose delivered over a minute. No complications were noted.  The total administered dose was 134.2 microcuries in a volume of 10cc.   A saline flush of the line and the syringe that contained the isotope was then performed.  The residual radioactivity in the syringe was 3.2 microcuries, so the actual infused isotope activity was 131.0 microcuries.   Pressure was applied to the venipuncture site, and a compression bandage placed.   Radiation Safety personnel were present to perform the discharge survey, as detailed on their documentation.   After a short period of observation, the patient had his IV removed.  Impression:  The patient tolerated his infusion relatively well.  Plan:  The patient will return in one month for ongoing care. I have refilled his MS  contin prescription and started megace today.    This document serves as a record of services personally performed by Tyler Pita, MD. It was created on his behalf by Arlyce Harman, a trained medical scribe. The creation of this record is based on the scribe's personal observations and the provider's statements to them. This document has been checked and approved by the attending provider.    ________________________________  Sheral Apley. Tammi Klippel, M.D.

## 2014-09-20 ENCOUNTER — Ambulatory Visit (INDEPENDENT_AMBULATORY_CARE_PROVIDER_SITE_OTHER): Payer: Medicare Other | Admitting: Family Medicine

## 2014-09-20 ENCOUNTER — Encounter: Payer: Self-pay | Admitting: Family Medicine

## 2014-09-20 VITALS — BP 120/70 | HR 67 | Wt 199.4 lb

## 2014-09-20 DIAGNOSIS — J309 Allergic rhinitis, unspecified: Secondary | ICD-10-CM | POA: Diagnosis not present

## 2014-09-20 DIAGNOSIS — J4521 Mild intermittent asthma with (acute) exacerbation: Secondary | ICD-10-CM

## 2014-09-20 DIAGNOSIS — C61 Malignant neoplasm of prostate: Secondary | ICD-10-CM

## 2014-09-20 MED ORDER — ALBUTEROL SULFATE HFA 108 (90 BASE) MCG/ACT IN AERS: 2.0000 | INHALATION_SPRAY | Freq: Four times a day (QID) | RESPIRATORY_TRACT | Status: DC | PRN

## 2014-09-20 NOTE — Patient Instructions (Signed)
Use the inhaler as needed to be 2 puffs 4 times a day. Need to continue to use this and we will need to put her on a longer acting medicine

## 2014-09-20 NOTE — Progress Notes (Signed)
   Subjective:    Patient ID: Marcus [name], male    DOB: 03-23-1945, 69 y.o.   MRN: [id number]  HPI  He complains of a three-week history of difficulty with wheezing that he notes mainly at night. He also notices it with increased physical activity. He has had no chest or nasal congestion, rhinorrhea, sneezing, chest pain, shortness of breath, DOE, fever or chills. He does have a previous history of asthma as well as underlying allergic rhinitis.He states that he does note more difficulty with breathing this time a year. Presently he is on no allergy medications. He does have underlying prostate cancer and is being actively treated.   Review of Systems     Objective:   Physical Exam Alert and in no distress. Tympanic membranes and canals are normal. Pharyngeal area is normal. Neck is supple without adenopathy or thyromegaly. Cardiac exam shows a regular sinus rhythm without murmurs or gallops. Lungs are clear to auscultation.        Assessment & Plan:  Asthma with acute exacerbation, mild intermittent - Plan: albuterol (PROVENTIL HFA;VENTOLIN HFA) 108 (90 BASE) MCG/ACT inhaler  Prostate cancer  Allergic rhinitis, mild  I will treat him with albuterol. He is to return here in 3 weeks for recheck. For this will take care of it but I am concerned over why this started up again except for the fact that he says this time years usually when he has trouble.

## 2014-09-21 ENCOUNTER — Telehealth: Payer: Self-pay | Admitting: Family Medicine

## 2014-09-21 MED ORDER — ALBUTEROL SULFATE 108 (90 BASE) MCG/ACT IN AEPB: 2.0000 | INHALATION_SPRAY | Freq: Four times a day (QID) | RESPIRATORY_TRACT | Status: DC | PRN

## 2014-09-21 NOTE — Telephone Encounter (Signed)
Recv'd fax that Proventil not covered, preferred is Proair Respiclick or Proair, do you want to switch?

## 2014-09-22 ENCOUNTER — Other Ambulatory Visit: Payer: Self-pay

## 2014-09-28 ENCOUNTER — Other Ambulatory Visit: Payer: Self-pay | Admitting: Radiation Oncology

## 2014-09-28 DIAGNOSIS — C61 Malignant neoplasm of prostate: Secondary | ICD-10-CM

## 2014-09-28 DIAGNOSIS — C7951 Secondary malignant neoplasm of bone: Principal | ICD-10-CM

## 2014-09-30 ENCOUNTER — Telehealth: Payer: Self-pay | Admitting: *Deleted

## 2014-09-30 NOTE — Telephone Encounter (Signed)
CALLED PATIENT TO INFORM OF LAB AND XOFIGO INJ., LVM FOR A RETURN CALL 

## 2014-10-03 ENCOUNTER — Other Ambulatory Visit: Payer: Self-pay | Admitting: Family Medicine

## 2014-10-06 ENCOUNTER — Other Ambulatory Visit: Payer: Medicare Other

## 2014-10-06 ENCOUNTER — Ambulatory Visit: Payer: Medicare Other | Admitting: Oncology

## 2014-10-06 ENCOUNTER — Ambulatory Visit: Payer: Medicare Other

## 2014-10-07 ENCOUNTER — Telehealth: Payer: Self-pay | Admitting: Oncology

## 2014-10-07 NOTE — Telephone Encounter (Signed)
S/w pt's wife [phone] confirming labs/ov/inj per MD staff msg where pt missed apt due to flat tire..... KJ

## 2014-10-13 ENCOUNTER — Ambulatory Visit (HOSPITAL_BASED_OUTPATIENT_CLINIC_OR_DEPARTMENT_OTHER): Payer: Medicare Other

## 2014-10-13 ENCOUNTER — Encounter: Payer: Self-pay | Admitting: Radiation Oncology

## 2014-10-13 ENCOUNTER — Other Ambulatory Visit (HOSPITAL_BASED_OUTPATIENT_CLINIC_OR_DEPARTMENT_OTHER): Payer: Medicare Other

## 2014-10-13 ENCOUNTER — Ambulatory Visit (HOSPITAL_BASED_OUTPATIENT_CLINIC_OR_DEPARTMENT_OTHER): Payer: Medicare Other | Admitting: Oncology

## 2014-10-13 ENCOUNTER — Ambulatory Visit: Payer: Medicare Other | Attending: Radiation Oncology

## 2014-10-13 ENCOUNTER — Other Ambulatory Visit: Payer: Self-pay | Admitting: *Deleted

## 2014-10-13 VITALS — BP 120/70 | HR 65 | Temp 98.6°F | Resp 18 | Ht 74.0 in | Wt 201.4 lb

## 2014-10-13 DIAGNOSIS — C61 Malignant neoplasm of prostate: Secondary | ICD-10-CM

## 2014-10-13 DIAGNOSIS — C7951 Secondary malignant neoplasm of bone: Secondary | ICD-10-CM | POA: Diagnosis not present

## 2014-10-13 LAB — COMPREHENSIVE METABOLIC PANEL (CC13)
ALBUMIN: 3.2 g/dL — AB (ref 3.5–5.0)
ALK PHOS: 89 U/L (ref 40–150)
ALT: 14 U/L (ref 0–55)
AST: 16 U/L (ref 5–34)
Anion Gap: 6 mEq/L (ref 3–11)
BUN: 19.7 mg/dL (ref 7.0–26.0)
CALCIUM: 9.7 mg/dL (ref 8.4–10.4)
CO2: 24 mEq/L (ref 22–29)
CREATININE: 1.2 mg/dL (ref 0.7–1.3)
Chloride: 111 mEq/L — ABNORMAL HIGH (ref 98–109)
EGFR: 71 mL/min/{1.73_m2} — ABNORMAL LOW (ref >90)
GLUCOSE: 105 mg/dL (ref 70–140)
Potassium: 4 mEq/L (ref 3.5–5.1)
SODIUM: 142 meq/L (ref 136–145)
TOTAL PROTEIN: 6.6 g/dL (ref 6.4–8.3)

## 2014-10-13 LAB — CBC WITH DIFFERENTIAL/PLATELET
BASO%: 0.8 % (ref 0.0–2.0)
BASOS ABS: 0.1 10*3/uL (ref 0.0–0.1)
EOS%: 7 % (ref 0.0–7.0)
Eosinophils Absolute: 0.5 10*3/uL (ref 0.0–0.5)
HEMATOCRIT: 26.4 % — AB (ref 38.4–49.9)
HEMOGLOBIN: 8.8 g/dL — AB (ref 13.0–17.1)
LYMPH#: 0.7 10*3/uL — AB (ref 0.9–3.3)
LYMPH%: 10.3 % — ABNORMAL LOW (ref 14.0–49.0)
MCH: 30.4 pg (ref 27.2–33.4)
MCHC: 33.1 g/dL (ref 32.0–36.0)
MCV: 91.7 fL (ref 79.3–98.0)
MONO#: 0.7 10*3/uL (ref 0.1–0.9)
MONO%: 10.2 % (ref 0.0–14.0)
NEUT%: 71.7 % (ref 39.0–75.0)
NEUTROS ABS: 5.1 10*3/uL (ref 1.5–6.5)
Platelets: 266 10*3/uL (ref 140–400)
RBC: 2.88 10*6/uL — ABNORMAL LOW (ref 4.20–5.82)
RDW: 15.1 % — AB (ref 11.0–14.6)
WBC: 7.1 10*3/uL (ref 4.0–10.3)

## 2014-10-13 MED ORDER — DENOSUMAB 120 MG/1.7ML ~~LOC~~ SOLN
120.0000 mg | Freq: Once | SUBCUTANEOUS | Status: AC
  Administered 2014-10-13: 120 mg via SUBCUTANEOUS
  Filled 2014-10-13: qty 1.7

## 2014-10-13 MED ORDER — OXYCODONE-ACETAMINOPHEN 5-325 MG PO TABS: 1.0000 | ORAL_TABLET | ORAL | Status: DC | PRN

## 2014-10-13 MED ORDER — PROCHLORPERAZINE MALEATE 10 MG PO TABS: ORAL_TABLET | ORAL | Status: DC

## 2014-10-13 NOTE — Progress Notes (Signed)
Hematology and Oncology Follow Up Visit  Marcus [name] [id number] Mar 29, 1945 69 y.o. 10/13/2014 11:42 AM   Principle Diagnosis: 69 year old gentleman diagnosed with prostate cancer in 2005. 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   Prior Therapy:  He is status post prostatectomy and subsequently was treated with salvage radiation therapy in 2007.  In February of 2013 his PSA was up to 22.5 and was started on androgen deprivation.  He developed castration resistant disease with documented bony metastasis despite combined androgen depravation with Lupron and Casodex. His last bone scan done on 09/18/2012 which showed progression of disease. Zytiga a total of 1000 mg started in August of 2014 for the PSA of 32.44. He was discontinued in November 2015 due to a rise in his PSA Xtandi 160 mg daily started in November 2015. Stopped on 05/13/2014 due to progression of disease.   Current therapy:   Xofigo under the care of Dr. Tammi Klippel. First treatment given 06/09/2014. He is S/P 4 out of 6. He is continued on Lupron at [facility] urology. His Xgeva to be given and the [place] since 06/2013.   Interim History:  [name] presents today for a followup visit with his wife. Since his last visit, he reports no new complaints. He continues to be on Xofigo without any complications and have received 4 out of 6 treatments. He is ready to receive the fifth one in the near future. Other than metallic taste in his mouth, he has no other complaints. He continues to be well and have gained weight. He reports constipation. He is using stool softeners with excellent control.  He continues to be active and performs activities of daily living. He was able to drive long distances without any trouble.  His pain has been under excellent control with long-acting morphine and breakthrough oxycodone. He takes oxycodone about every 5-6 hours. He has been scheduling his  breakthrough pain medication at times to prevent in crisis. The most part his pain is under excellent control.  He does not report any abdominal pain or distention. He does not report any hematochezia or bleeding per rectum. He does not report any nausea or vomiting. Did not report any urinary symptoms of hematuria dysuria or incontinence.  Did not report any lower extremity swelling. He has not reported any neurological symptoms including headaches blurred vision or seizures. He has not reported any chest pain or difficulty breathing. Has not reported any cough or hemoptysis or wheezing. Has not reported any major changes in his bowel habits. He did not report any skin changes or petechiae. Has not reported any lymphadenopathy. Remainder of his review of system is unremarkable.  Medications: I have reviewed the patient's current medications.  Current Outpatient Prescriptions  Medication Sig Dispense Refill  . Albuterol Sulfate (PROAIR RESPICLICK) 073 (90 BASE) MCG/ACT AEPB Inhale 2 puffs into the lungs 4 (four) times daily as needed. 1 each 1  . cholecalciferol (VITAMIN D) 1000 UNITS tablet Take 1,000 Units by mouth daily.    Marland Kitchen lisinopril-hydrochlorothiazide (PRINZIDE,ZESTORETIC) 20-25 MG per tablet TAKE ONE TABLET BY MOUTH ONCE DAILY 90 tablet 0  . megestrol (MEGACE) 40 MG/ML suspension Take 10 mLs (400 mg total) by mouth daily. 480 mL 0  . morphine (MS CONTIN) 30 MG 12 hr tablet Take 1 tablet (30 mg total) by mouth every 12 (twelve) hours. 60 tablet 0  . oxyCODONE-acetaminophen (PERCOCET/ROXICET) 5-325 MG per tablet Take 1-2 tablets by mouth every 4 (four) hours as  needed for severe pain (Do not take more than 12 tablets per 24 hours.). 120 tablet 0  . prochlorperazine (COMPAZINE) 10 MG tablet TAKE ONE TABLET BY MOUTH EVERY 6 HOURS AS NEEDED FOR NAUSEA AND  VOMITTING 30 tablet 0  . senna-docusate (SENNA S) 8.6-50 MG per tablet Take 1 tablet by mouth at bedtime. 30 tablet 3  . XOFIGO 27 MCCI/ML SOLN  Inject 50 KBq/kg into the vein every 30 (thirty) days. For 6 monthly infusions in nuclear medicine 6 each 0   No current facility-administered medications for this visit.     Allergies: No Known Allergies   Physical Exam: Blood pressure 120/70, pulse 65, temperature 98.6 F (37 C), temperature source Oral, resp. rate 18, height 6\' 2"  (1.88 m), weight 201 lb 6.4 oz (91.354 kg), SpO2 100 %. ECOG: 1 General appearance: alert awake gentleman without distress. Head: Normocephalic, without obvious abnormality Neck: no adenopathy and No masses or thyromegaly. Lymph nodes: Cervical, supraclavicular, and axillary nodes normal. Heart:regular rate and rhythm, S1, S2. No murmurs rubs or gallops. Lung:chest clear, no wheezing, rales. No dullness to percussion. Abdomen: soft, non-tender, without masses or organomegaly no shifting dullness EXT:no erythema, induration, no edema noted. Neurological: No deficits.    Lab Results: Lab Results  Component Value Date   WBC 7.1 10/13/2014   HGB 8.8* 10/13/2014   HCT 26.4* 10/13/2014   MCV 91.7 10/13/2014   PLT 266 10/13/2014     Chemistry      Component Value Date/Time   NA 139 09/06/2014 1242   NA 139 05/25/2012 1522   K 4.2 09/06/2014 1242   K 4.2 05/25/2012 1522   CL 104 05/25/2012 1522   CO2 22 09/06/2014 1242   CO2 27 05/25/2012 1522   BUN 14.8 09/06/2014 1242   BUN 17 05/25/2012 1522   CREATININE 1.1 09/06/2014 1242   CREATININE 1.37* 05/25/2012 1522      Component Value Date/Time   CALCIUM 9.9 09/06/2014 1242   CALCIUM 11.0* 05/25/2012 1522   ALKPHOS 91 09/06/2014 1242   ALKPHOS 78 05/25/2012 1522   AST 12 09/06/2014 1242   AST 31 05/25/2012 1522   ALT 15 09/06/2014 1242   ALT 37 05/25/2012 1522   BILITOT 0.24 09/06/2014 1242   BILITOT 0.4 05/25/2012 1522         Results for ANTONINO, NIENHUIS (MRN [id number]) as of 10/13/2014 11:30  Ref. Range 07/12/2014 11:33 08/09/2014 10:05 09/06/2014 12:41  PSA Latest Ref Range: <=4.00  ng/mL 197.40 (H) 196.30 (H) 106.50 (H)         Impression and Plan:  69 year old gentleman with the following issues:  1. Castration resistant prostate cancer with metastatic disease to the bone. He has developed castration resistant disease and have progressed on both Zytiga and most recently on xtandi. His most recent PSA was up to 209.  He is status post 4 of 6 planned treatments with Xofigo under the care of Dr. Tammi Klippel. He tolerated the treatment without any difficulty. His PSA and alkaline phosphatase have been declining indicating response to therapy. PSA and alkaline phosphatase numbers were reviewed with the patient today. His PSA response is close to 50% now after 3 infusions. His alkaline phosphatase is back to normal. These are encouraging results at this time indicating positive response.   The plan is to continue with the same dose and schedule for a total of 6 treatments.  2. Androgen depravation: His last testosterone is 91 in December 2016 and will be repeated  in the future and administer Lupron as needed.  3. Bone directed therapy: He is on Xgeva monthly which he will be receiving here at this time. He will continue calcium and vitamin D supplement as well as continuous education about osteonecrosis of the jaw. He has no issues continuing Xgeva for now. He does have some financial concerns regarding this medication. I have discussed with him the option of given this medication every other month and he will consider that at this time.  4.Right thigh pain/back pain: His pain is well-controlled with long-acting morphine and breakthrough oxycodone. I have planned to continue with his current regimen and I will refill his medication today.  5. Constipation: MiraLax in the morning and continue Senokot S, 2 at bedtime as long as he is taking pain medication. I have advised him to continue with hydration as he is doing. This appears to be working at this time.  6. Anemia: Likely  multifactorial in nature. He has anemia of chronic disease, malignancy also related to [facility]. He is minimally symptomatic at this time and we will continue to monitor him. I have offered him a packed red cell transfusions if he becomes symptomatic at this time. Growth factor support can also be used as well. We will continue to discuss this future visits.  6. Followup: Will be in about 4 to 5 weeks.  [facility] MD 9/22/201611:42 AM

## 2014-10-13 NOTE — Progress Notes (Signed)
Placed recent labs and weight on Dr. Johny Shears desk to review in preparation for [place] on 10/20/2014.

## 2014-10-14 ENCOUNTER — Telehealth: Payer: Self-pay | Admitting: *Deleted

## 2014-10-14 ENCOUNTER — Telehealth: Payer: Self-pay | Admitting: Radiation Oncology

## 2014-10-14 LAB — TESTOSTERONE: TESTOSTERONE: 19 ng/dL — AB (ref 300–890)

## 2014-10-14 LAB — PSA: PSA: 71.68 ng/mL — ABNORMAL HIGH (ref <=4.00)

## 2014-10-14 NOTE — Telephone Encounter (Signed)
Left message for patient to call me re: labs

## 2014-10-14 NOTE — Telephone Encounter (Signed)
Spoke with wife Thersa Salt, gave results of last PSA.

## 2014-10-14 NOTE — Telephone Encounter (Signed)
Per Romie Jumper patient's wife requested this RN call around 1600 to discuss concerns. Phoned home as requested. No answer. Left message encouraging this RN be contacted with needs.

## 2014-10-14 NOTE — Telephone Encounter (Signed)
-----   Message from Wyatt Portela, MD sent at 10/14/2014 10:03 AM EDT ----- Please call his PSA.

## 2014-10-17 ENCOUNTER — Other Ambulatory Visit: Payer: Self-pay | Admitting: Oncology

## 2014-10-19 ENCOUNTER — Telehealth: Payer: Self-pay | Admitting: *Deleted

## 2014-10-19 NOTE — Telephone Encounter (Signed)
CALLED PATIENT TO REMIND OF XOFIGO INJ. ON 10-20-14, SPOKE WITH PATIENT AND HE IS AWARE OF THIS INJ.

## 2014-10-20 ENCOUNTER — Ambulatory Visit (HOSPITAL_COMMUNITY)
Admission: RE | Admit: 2014-10-20 | Discharge: 2014-10-20 | Disposition: A | Discharge status: Home / Self Care | Payer: Medicare Other | Source: Ambulatory Visit | Attending: Radiation Oncology | Admitting: Radiation Oncology

## 2014-10-20 ENCOUNTER — Other Ambulatory Visit: Payer: Self-pay | Admitting: Radiation Oncology

## 2014-10-20 DIAGNOSIS — C61 Malignant neoplasm of prostate: Secondary | ICD-10-CM | POA: Diagnosis not present

## 2014-10-20 DIAGNOSIS — C7951 Secondary malignant neoplasm of bone: Secondary | ICD-10-CM

## 2014-10-20 MED ORDER — MORPHINE SULFATE ER 30 MG PO TBCR: 30.0000 mg | EXTENDED_RELEASE_TABLET | Freq: Two times a day (BID) | ORAL | Status: DC

## 2014-10-20 NOTE — Progress Notes (Signed)
  Radiation Oncology         [phone]) [phone] ________________________________  Name: Marcus [name] MRN: [id number]  Date: 10/20/2014  DOB: 1945/07/08  Radium-223 Infusion Note  Diagnosis:  Castration resistant prostate cancer with painful bone involvement  Current Infusion:    5  Planned Infusions:  6  Narrative: Marcus [name] presented to nuclear medicine for treatment. His most recent blood counts were reviewed.  He remains a good candidate to proceed with Ra-223.  The patient was situated in an infusion suite with a contact barrier placed under his arm. Intravenous access was established, using sterile technique, and a normal saline infusion from a syringe was started.  Micro-dosimetry:  The prescribed radiation activity was assayed and confirmed to be within specified tolerance.  Special Treatment Procedure - Infusion:  The nuclear medicine technologist and I personally verified the dose activity to be delivered as specified in the written directive, and verified the patient identification via 2 separate methods.  The syringe containing the dose was attached to a 3 way stopcock, and then the valve was opened to the patient, and the dose delivered over a minute. No complications were noted.  The total administered dose was 132.3 microcuries in a volume of 10cc.   A saline flush of the line and the syringe that contained the isotope was then performed.  The residual radioactivity in the syringe was 4.4 microcuries, so the actual infused isotope activity was 127.9 microcuries.   Pressure was applied to the venipuncture site, and a compression bandage placed.   Radiation Safety personnel were present to perform the discharge survey, as detailed on their documentation.   After a short period of observation, the patient had his IV removed.  Results for Marcus [name], Marcus [name] (MRN [id number]) as of 10/20/2014 13:33  Ref. Range 11/23/2013 11:18 12/27/2013 10:45 02/01/2014 11:17 03/08/2014 11:11 04/13/2014  10:48 05/11/2014 09:07 06/14/2014 11:08 07/12/2014 11:33 08/09/2014 10:05 09/06/2014 12:41 10/13/2014 11:00  PSA Latest Ref Range: <=4.00 ng/mL 11.41 (H) 12.87 (H) 17.97 (H) 27.28 (H) 47.84 (H) 81.50 (H) 209.00 (H) 197.40 (H) 196.30 (H) 106.50 (H) 71.68 (H)    Impression:  The patient tolerated his infusion relatively well. I refilled the patient's MS contin prescription today.  Plan:  The patient will return in one month for ongoing care. I have refilled his MS contin prescription today.    This document serves as a record of services personally performed by Marcus Pita, MD. It was created on his behalf by Arlyce Harman, a trained medical scribe. The creation of this record is based on the scribe's personal observations and the provider's statements to them. This document has been checked and approved by the attending provider.    ________________________________  Sheral Apley. Tammi Klippel, M.D.

## 2014-10-25 ENCOUNTER — Other Ambulatory Visit: Payer: Self-pay | Admitting: Radiation Oncology

## 2014-10-25 DIAGNOSIS — C61 Malignant neoplasm of prostate: Secondary | ICD-10-CM

## 2014-10-25 DIAGNOSIS — C7951 Secondary malignant neoplasm of bone: Principal | ICD-10-CM

## 2014-10-26 ENCOUNTER — Telehealth: Payer: Self-pay | Admitting: *Deleted

## 2014-10-26 NOTE — Telephone Encounter (Signed)
CALLED PATIENT TO INFORM OF LAB ON 11-10-14 AND HIS XOFIGO INJ. ON 11-17-14 - ARRIVAL TIME - 11:45 AM @ [facility], SPOKE WITH PATIENT'S WIFE AND SHE IS AWARE OF THESE APPTS.

## 2014-11-03 ENCOUNTER — Encounter: Payer: Self-pay | Admitting: Medical

## 2014-11-03 ENCOUNTER — Ambulatory Visit (INDEPENDENT_AMBULATORY_CARE_PROVIDER_SITE_OTHER): Payer: Medicare Other | Admitting: Medical

## 2014-11-03 VITALS — BP 108/70 | HR 72 | Temp 98.3°F

## 2014-11-03 DIAGNOSIS — J988 Other specified respiratory disorders: Secondary | ICD-10-CM

## 2014-11-03 DIAGNOSIS — R062 Wheezing: Secondary | ICD-10-CM

## 2014-11-03 DIAGNOSIS — C61 Malignant neoplasm of prostate: Secondary | ICD-10-CM

## 2014-11-03 DIAGNOSIS — R509 Fever, unspecified: Secondary | ICD-10-CM | POA: Diagnosis not present

## 2014-11-03 DIAGNOSIS — Z8709 Personal history of other diseases of the respiratory system: Secondary | ICD-10-CM

## 2014-11-03 MED ORDER — ALBUTEROL SULFATE 108 (90 BASE) MCG/ACT IN AEPB: 2.0000 | INHALATION_SPRAY | Freq: Four times a day (QID) | RESPIRATORY_TRACT | Status: AC | PRN

## 2014-11-03 MED ORDER — AZITHROMYCIN 250 MG PO TABS: ORAL_TABLET | ORAL | Status: DC

## 2014-11-03 NOTE — Patient Instructions (Signed)
Recommendations:  Rest  Drink plenty of fluids such as water  Begin Zpak antibiotic for 5 days  For body aches and chills, use OTC Ibuprofen, 1 -3 tablet up to three times daily for the next few days only  Use the Albuterol inhaler as needed  If not improved by Monday call back  If much worse over the weekend such as high fever, shortness of breath, or much trouble breathing, then go to the emergency department

## 2014-11-03 NOTE — Progress Notes (Signed)
Subjective Chief Complaint  Patient presents with  . Chills/ Aching    started four days ago. staying cold and achy. says he feels like he can not get warm. wants to know of he can get more nasal spray.   Here for illness.  Over the last week has chills, cold feeling, body aches, back aching, congestion in throat, hoarse, possible fever.  No cough, is sneezing though.  Denies nausea, no diarrhea, no abdominal pain, no ear pain.    No sick contacts.  Doesn't normally get sick often.  Been doing ok in general prior to these symptoms.  No current urinary symptom.   Using inhaler some since last visit but not daily, but needs refill.  No other aggravating or relieving factors. No other complaint.   Past Medical History  Diagnosis Date  . Asthma   . Allergy     RHINITIS  . Hypertension   . Calcium oxalate renal stones   . Dyslipidemia   . Cancer ([place]) 2005    PROSTATE  . Prostate cancer ([place])     castration resistant prostate cancer with metastatic disease to the bone    ROS as in subjective  Objective: BP 108/70 mmHg  Pulse 72  Temp(Src) 98.3 F (36.8 C)  General appearance: alert, no distress, WD/WN, mildly ill appearing HEENT: normocephalic, sclerae anicteric, conjunctiva pink and moist, TMs pearly, nares patent, some mucoid discharge, + erythema, pharynx normal, tonsils unremarkable Oral cavity: MMM, no lesions Neck: supple, no lymphadenopathy, no thyromegaly, no masses Heart: RRR, normal S1, S2, no murmurs Lungs: CTA bilaterally, no wheezes, rhonchi, or rales Pulses: 2+ symmetric     Assessment: Encounter Diagnoses  Name Primary?  . Infection, respiratory tract Yes  . Wheezing   . Fever and chills   . History of asthma   . Prostate cancer [facility])     Plan: Discussed symptoms, exam findings, concerns.   Rest, hydrate well, begin Zpak, albuterol prn.  If not seeing improvement within 3-4 days, or if worsening, recheck.

## 2014-11-04 ENCOUNTER — Telehealth: Payer: Self-pay | Admitting: Medical

## 2014-11-04 NOTE — Telephone Encounter (Signed)
pls call and see how he is feeling today.   FYI - I found a note in my stack on my desk after the fact that was suppose to been given to me urgently the day of his appt that said he was being weaned off radiation, morphine and oxycodone, and wasn't sure if these had impact on his condition he was seen for 11/03/14.  I doubt they are related, but didn't see this note til today 11/04/14 in the late afternoon.

## 2014-11-07 NOTE — Telephone Encounter (Signed)
Have him return for recheck, and we will need to check some labs at this point too.

## 2014-11-07 NOTE — Telephone Encounter (Signed)
Said he is not feeling good at all. Michela Pitcher he is feeling exhausted, been laying in the bed mostly all the past few days. Stomach has been bothering him. Has only taken medication you have gaven him, maybe two oxycodone but no morphine at all. Last pill taken today.

## 2014-11-08 ENCOUNTER — Encounter: Payer: Self-pay | Admitting: Medical

## 2014-11-08 ENCOUNTER — Ambulatory Visit
Admission: RE | Admit: 2014-11-08 | Discharge: 2014-11-08 | Disposition: A | Discharge status: Home / Self Care | Payer: Medicare Other | Source: Ambulatory Visit | Attending: Medical | Admitting: Medical

## 2014-11-08 ENCOUNTER — Ambulatory Visit (INDEPENDENT_AMBULATORY_CARE_PROVIDER_SITE_OTHER): Payer: Medicare Other | Admitting: Medical

## 2014-11-08 VITALS — BP 100/54 | HR 78 | Temp 98.4°F | Wt 193.0 lb

## 2014-11-08 DIAGNOSIS — R05 Cough: Secondary | ICD-10-CM | POA: Diagnosis not present

## 2014-11-08 DIAGNOSIS — R5383 Other fatigue: Secondary | ICD-10-CM

## 2014-11-08 DIAGNOSIS — R195 Other fecal abnormalities: Secondary | ICD-10-CM

## 2014-11-08 DIAGNOSIS — C61 Malignant neoplasm of prostate: Secondary | ICD-10-CM | POA: Diagnosis not present

## 2014-11-08 DIAGNOSIS — R109 Unspecified abdominal pain: Secondary | ICD-10-CM

## 2014-11-08 DIAGNOSIS — R5381 Other malaise: Secondary | ICD-10-CM | POA: Diagnosis not present

## 2014-11-08 DIAGNOSIS — J3489 Other specified disorders of nose and nasal sinuses: Secondary | ICD-10-CM

## 2014-11-08 LAB — CBC
HEMATOCRIT: 25.7 % — AB (ref 39.0–52.0)
HEMOGLOBIN: 8.9 g/dL — AB (ref 13.0–17.0)
MCH: 30 pg (ref 26.0–34.0)
MCHC: 34.6 g/dL (ref 30.0–36.0)
MCV: 86.5 fL (ref 78.0–100.0)
MPV: 7.9 fL — ABNORMAL LOW (ref 8.6–12.4)
Platelets: 323 10*3/uL (ref 150–400)
RBC: 2.97 MIL/uL — ABNORMAL LOW (ref 4.22–5.81)
RDW: 14.3 % (ref 11.5–15.5)
WBC: 9.2 10*3/uL (ref 4.0–10.5)

## 2014-11-08 LAB — COMPREHENSIVE METABOLIC PANEL
ALBUMIN: 3.6 g/dL (ref 3.6–5.1)
ALK PHOS: 75 U/L (ref 40–115)
ALT: 16 U/L (ref 9–46)
AST: 17 U/L (ref 10–35)
BILIRUBIN TOTAL: 0.6 mg/dL (ref 0.2–1.2)
BUN: 16 mg/dL (ref 7–25)
CALCIUM: 9.1 mg/dL (ref 8.6–10.3)
CO2: 26 mmol/L (ref 20–31)
Chloride: 101 mmol/L (ref 98–110)
Creat: 1.09 mg/dL (ref 0.70–1.18)
GLUCOSE: 109 mg/dL — AB (ref 65–99)
POTASSIUM: 4 mmol/L (ref 3.5–5.3)
Sodium: 135 mmol/L (ref 135–146)
Total Protein: 6.7 g/dL (ref 6.1–8.1)

## 2014-11-08 LAB — POCT URINALYSIS DIPSTICK
Bilirubin, UA: NEGATIVE
Blood, UA: NEGATIVE
GLUCOSE UA: NEGATIVE
Ketones, UA: NEGATIVE
Leukocytes, UA: NEGATIVE
NITRITE UA: NEGATIVE
PH UA: 6
Protein, UA: 2
UROBILINOGEN UA: NEGATIVE

## 2014-11-08 LAB — POC INFLUENZA A&B (BINAX/QUICKVUE)
Influenza A, POC: NEGATIVE
Influenza B, POC: NEGATIVE

## 2014-11-08 LAB — LIPASE: Lipase: 21 U/L (ref 7–60)

## 2014-11-08 NOTE — Progress Notes (Signed)
Subjective: Chief Complaint  Patient presents with  . Follow-up    feeling terrible. same as before. chills and hot, soreness around waist. hasnt been eating much.    Here today accompanied by wife and granddaughter. I saw him 11/03/14 for general URI symptoms, cough, wheezing, body aches, chills, and subjective fever.  At that time vitals were fine and lungs were clear, and I ended up treating him for respiratory tract infection.   He notes since then doesn't feel much improved other than breathing and wheezing a little better, but today and for last several days he reports body aches, right flank pain, pain in his upper thighs bilat that seems to wrap around, fatigue, chills.  He has some dyspnea but this is improved from last week.  Not having to use inhaler.  He did complete the zpak.  He also weaned down on pain medication over the past several weeks. About a moth ago was taking morphine BID and oxycodone QID, but he has gradually been weaning off this, and now on ly on Oxycodone once daily and no morphine at this time.  In general he is not in severe pain, and doesn't think coming off the medication has been a problem.  He also reports some achiness in abdomen and loose stools up to twice daily the last few days.  No other aggravating or relieving factors. No other complaint.  Past Medical History  Diagnosis Date  . Asthma   . Allergy     RHINITIS  . Hypertension   . Calcium oxalate renal stones   . Dyslipidemia   . Cancer ([place]) 2005    PROSTATE  . Prostate cancer ([place])     castration resistant prostate cancer with metastatic disease to the bone    ROS as in subjective  Objective: BP 100/54 mmHg  Pulse 78  Temp(Src) 98.4 F (36.9 C)  Wt 193 lb (87.544 kg)  Gen: wd, wn, nad Neck: supple, nontender, no mass, no thyromegaly HENT - TMs pearly, nares patent, sinus nontender, conjunctiva somewhat pale Oral: mild dryness of mucosa, no lesions  Lungs clear Heart RRR, normal s1, s2,  no murmurs Abdomen: +bs, soft, upper vertical midline surgical scar, several bilat port surgical scars, there is some mild generalized tenderness, somewhat guarded, but not rigid, no distention, no specific mass or organomegaly but there is some fullness in the abdomen nonspecific Back: nontender Legs nontender, no obvious deformity    Assessment: Encounter Diagnoses  Name Primary?  . Other fatigue Yes  . Side pain   . Malaise   . Loose stools   . Rhinorrhea   . Malignant neoplasm of prostate ([place])     Plan: discussed symptoms, concerns, possible causes.   He seems somewhat improved from the respiratory illness I saw him for last week, but today he still c/o significant fatigue.  He does have underlying metastatic prostate cancer, on therapy for this.   We will check labs, urine, flu test, and CXR today.  F/u pending labs.  discussed case with supervising physician.  Jarquis was seen today for follow-up.  Diagnoses and all orders for this visit:  Other fatigue -     CBC -     Comprehensive metabolic panel -     Lipase -     POCT urinalysis dipstick -     DG Chest 2 View; Future -     Cancel: Influenza A/B -     POC Influenza A&B (Binax test)  Side pain -  CBC -     Comprehensive metabolic panel -     Lipase -     POCT urinalysis dipstick -     DG Chest 2 View; Future  Malaise -     CBC -     Comprehensive metabolic panel -     Lipase -     POCT urinalysis dipstick -     DG Chest 2 View; Future  Loose stools -     CBC -     Comprehensive metabolic panel -     Lipase -     POCT urinalysis dipstick -     DG Chest 2 View; Future -     Cancel: Influenza A/B -     POC Influenza A&B (Binax test)  Rhinorrhea -     CBC -     Comprehensive metabolic panel -     Lipase -     POCT urinalysis dipstick -     DG Chest 2 View; Future -     Cancel: Influenza A/B -     POC Influenza A&B (Binax test)  Malignant neoplasm of prostate ([facility]CC) -     CBC -     Comprehensive  metabolic panel -     Lipase -     POCT urinalysis dipstick -     DG Chest 2 View; Future

## 2014-11-10 ENCOUNTER — Telehealth: Payer: Self-pay

## 2014-11-10 ENCOUNTER — Ambulatory Visit
Admission: RE | Admit: 2014-11-10 | Discharge: 2014-11-10 | Disposition: A | Discharge status: Home / Self Care | Payer: Medicare Other | Source: Ambulatory Visit | Attending: Radiation Oncology | Admitting: Radiation Oncology

## 2014-11-10 ENCOUNTER — Encounter: Payer: Self-pay | Admitting: Gastroenterology

## 2014-11-10 ENCOUNTER — Ambulatory Visit: Payer: Medicare Other

## 2014-11-10 DIAGNOSIS — C61 Malignant neoplasm of prostate: Secondary | ICD-10-CM | POA: Diagnosis not present

## 2014-11-10 NOTE — Telephone Encounter (Signed)
Ok.   FYI - when he returns, we will need to check orthostatic vitals, STAT hemoglobin here or STAT CBC, and FOBT/occult stool for blood test, particular if still seeing black stools .

## 2014-11-10 NOTE — Telephone Encounter (Signed)
Left message for his wife to call me back to see if she could come by and pick up 3 stool cards so we can rule out blood in the stool

## 2014-11-10 NOTE — Addendum Note (Signed)
Addended by: Billie Lade on: 11/10/2014 12:25 PM   Modules accepted: Orders

## 2014-11-10 NOTE — Telephone Encounter (Signed)
Spoke with him he said he has not taken Pepto nor iron. He said his stomach is not aching but it is just not feeling good. Has not had any bowel movements today but yesterday he did and it was loose. Spoke with Dr. Redmond School and he wants him to come in tomorrow. Marcus [name] can not come in tomorrow so we scheduled Monday and between Dr Redmond School and Audelia Acton they wanted the earliest which shane had earliest time 12pm.

## 2014-11-11 ENCOUNTER — Other Ambulatory Visit: Payer: Self-pay | Admitting: Radiation Oncology

## 2014-11-11 DIAGNOSIS — C61 Malignant neoplasm of prostate: Secondary | ICD-10-CM

## 2014-11-11 LAB — PSA: PSA: 134.8 ng/mL — ABNORMAL HIGH (ref <=4.00)

## 2014-11-11 NOTE — Telephone Encounter (Signed)
done

## 2014-11-13 NOTE — Progress Notes (Signed)
  Radiation Oncology         [phone] ________________________________  Name: Marcus [name] MRN: [id number]  Date: 08/18/2014  DOB: [birth year]  Radium-223 Infusion Note  Diagnosis:  Castration resistant prostate cancer with painful bone involvement  Current Infusion:    3  Planned Infusions:  6  Narrative: Mr. Marcus [name] presented to nuclear medicine for treatment. His most recent blood counts were reviewed.  He remains a good candidate to proceed with Ra-223.  The patient was situated in an infusion suite with a contact barrier placed under his arm. Intravenous access was established, using sterile technique, and a normal saline infusion from a syringe was started.  Micro-dosimetry:  The prescribed radiation activity was assayed and confirmed to be within specified tolerance.  Special Treatment Procedure - Infusion:  The nuclear medicine technologist and I personally verified the dose activity to be delivered as specified in the written directive, and verified the patient identification via 2 separate methods.  The syringe containing the dose was attached to a 3 way stopcock, and then the valve was opened to the patient, and the dose delivered over a minute. No complications were noted.  The total administered dose was measured and verified in microcuries.   A saline flush of the line and the syringe that contained the isotope was then performed.  The residual radioactivity in the syringe was measured microcuries, so the actual infused isotope activity was calculated and know in microcuries.   Pressure was applied to the venipuncture site, and a compression bandage placed.   Radiation Safety personnel were present to perform the discharge survey, as detailed on their documentation.   After a short period of observation, the patient had his IV removed.  Impression:  The patient tolerated his infusion relatively well.  Plan:  The patient will return in one month for ongoing care.      ________________________________  Sheral Apley. Tammi Klippel, M.D.

## 2014-11-13 NOTE — Addendum Note (Signed)
Encounter addended by: Tyler Pita, MD on: 11/13/2014  9:17 PM<BR>     Documentation filed: Notes Section, Visit Diagnoses, Problem List

## 2014-11-14 ENCOUNTER — Ambulatory Visit (INDEPENDENT_AMBULATORY_CARE_PROVIDER_SITE_OTHER): Payer: Medicare Other | Admitting: Medical

## 2014-11-14 ENCOUNTER — Ambulatory Visit
Admission: RE | Admit: 2014-11-14 | Discharge: 2014-11-14 | Disposition: A | Discharge status: Home / Self Care | Payer: Medicare Other | Source: Ambulatory Visit | Attending: Radiation Oncology | Admitting: Radiation Oncology

## 2014-11-14 VITALS — BP 92/68 | HR 55 | Wt 193.0 lb

## 2014-11-14 DIAGNOSIS — R5383 Other fatigue: Secondary | ICD-10-CM | POA: Insufficient documentation

## 2014-11-14 DIAGNOSIS — R531 Weakness: Secondary | ICD-10-CM

## 2014-11-14 DIAGNOSIS — R109 Unspecified abdominal pain: Secondary | ICD-10-CM

## 2014-11-14 DIAGNOSIS — C61 Malignant neoplasm of prostate: Secondary | ICD-10-CM | POA: Insufficient documentation

## 2014-11-14 DIAGNOSIS — R5381 Other malaise: Secondary | ICD-10-CM | POA: Diagnosis not present

## 2014-11-14 DIAGNOSIS — R195 Other fecal abnormalities: Secondary | ICD-10-CM | POA: Insufficient documentation

## 2014-11-14 LAB — CBC WITH DIFFERENTIAL/PLATELET
BASO%: 0.4 % (ref 0.0–2.0)
BASOS ABS: 0 10*3/uL (ref 0.0–0.1)
EOS ABS: 0.2 10*3/uL (ref 0.0–0.5)
EOS%: 2.1 % (ref 0.0–7.0)
HEMATOCRIT: 26.1 % — AB (ref 38.4–49.9)
HEMOGLOBIN: 8.8 g/dL — AB (ref 13.0–17.1)
LYMPH#: 0.8 10*3/uL — AB (ref 0.9–3.3)
LYMPH%: 8.5 % — ABNORMAL LOW (ref 14.0–49.0)
MCH: 30.3 pg (ref 27.2–33.4)
MCHC: 33.8 g/dL (ref 32.0–36.0)
MCV: 89.8 fL (ref 79.3–98.0)
MONO#: 0.4 10*3/uL (ref 0.1–0.9)
MONO%: 3.9 % (ref 0.0–14.0)
NEUT%: 85.1 % — ABNORMAL HIGH (ref 39.0–75.0)
NEUTROS ABS: 7.7 10*3/uL — AB (ref 1.5–6.5)
Platelets: 399 10*3/uL (ref 140–400)
RBC: 2.9 10*6/uL — ABNORMAL LOW (ref 4.20–5.82)
RDW: 14.4 % (ref 11.0–14.6)
WBC: 9 10*3/uL (ref 4.0–10.3)

## 2014-11-14 LAB — TECHNOLOGIST REVIEW

## 2014-11-14 NOTE — Progress Notes (Signed)
Subjective: Here for f/u at our request.  We saw him last week for fatigue, weakness, abdominal discomfort.   He is accompanied by his wife today.  Since last visit he had reported black stools when we called to check on him, but he does note he has used some Pepto Bismol last week a few days when he had the abdominal discomfort and loose stools.  He also started back some of the pain medication from oncology BID which greatly helped his abdomina pain and side pain.   Overall today he feels much better, less fatigued, not having loose stool or abdominal discomfort at the moment.   Denies blood in stool.  He hasn't had BM since 3 days go, but it was black appearing that day.   He is taking his medications including BP medications as usual.  Took his BP medication this morning.   He goes today to the cancer center for labs.  No other aggravating or relieving factors. No other complaint.  Past Medical History  Diagnosis Date  . Asthma   . Allergy     RHINITIS  . Hypertension   . Calcium oxalate renal stones   . Dyslipidemia   . Cancer ([place]) 2005    PROSTATE  . Prostate cancer ([place])     castration resistant prostate cancer with metastatic disease to the bone    ROS as in subjective  Objective: BP 92/68 mmHg  Pulse 55  Wt 193 lb (87.544 kg)  SpO2 96%  Gen: wd, wn, nad Neck: supple, nontender, no mass, no thyromegaly Oral normal mucosa, no lesions  Lungs clear Heart RRR, normal s1, s2, no murmurs Abdomen: +bs, soft, upper vertical midline surgical scar, several bilat port surgical scars, there is no tenderness today, no distention, no specific mass or organomegaly, no fullness in the abdomen nonspecific Back: nontender Legs nontender, no obvious deformity DRE: anus normal tone, no lesions, occult negative stool, no obvious bleeding hemorrhoid   Assessment: Encounter Diagnoses  Name Primary?  . Weakness Yes  . Other fatigue   . Malignant neoplasm of prostate ([place])   . Malaise   .  Abdominal discomfort   . Loose stools     Plan: Dr. Redmond School supervising physician and Mr. Sande Rives PCP came in the room today to talk with him.  At this point he feels improved, no abdominal discomfort or loose stools in the last few days.  Advised he use his pain medication if in pain as prescribed by oncology.  Can use OTC stool softener if he gets constipation from the pain medication.   He goes today to the cancer center for labs, so we will await the CBC, orthostatics borderline.   We will have him stop his Lisinopril HCT for now, and can resume at 1/2 tablet Lisinopril HCT daily if back to nora ml 120/80 by end of the week as discussed.     Alvar was seen today for orthostatic vitals.  Diagnoses and all orders for this visit:  Weakness -     Orthostatic vital signs  Other fatigue -     Orthostatic vital signs  Malignant neoplasm of prostate (HCC) -     Orthostatic vital signs  Malaise -     Orthostatic vital signs  Abdominal discomfort -     Orthostatic vital signs  Loose stools -     Orthostatic vital signs

## 2014-11-14 NOTE — Patient Instructions (Addendum)
Your blood pressure is too low.  For the time being STOP your blood pressure medication, Lisinopril HCT If your blood pressure is back to normal 120/80 on Thursday, then just restart at 1/2 tablet of Lisinopril HCT daily. We will be looking for the CBC lab tomorrow since you are going for labs today Use your pain medication if in pain. Glad to hear you are feeling somewhat better today Use OTC stool softener if needed.

## 2014-11-16 ENCOUNTER — Telehealth: Payer: Self-pay | Admitting: Medical

## 2014-11-16 ENCOUNTER — Telehealth: Payer: Self-pay | Admitting: *Deleted

## 2014-11-16 NOTE — Telephone Encounter (Signed)
Call and see how he is doing today?  I reviewed the CBC from earlier in the week which is stable.   How is his blood pressure the last day or two including today?    Is he still taking 1/2 tablet of the BP pill as we discussed?

## 2014-11-16 NOTE — Telephone Encounter (Signed)
Michela Pitcher it has been two days which he has not taken the bp medications. Says he will not take it tomorrow either. Michela Pitcher he is having the chills and stomach ache still but thinks its a side effect from medications. He is going to the doctor tomorrow and getting injections he said. He said over all he is feeling fine

## 2014-11-16 NOTE — Telephone Encounter (Signed)
CALLED PATIENT TO REMIND OF INJ. FOR 11-17-14, SPOKE WITH PATIENT AND HE IS AWARE OF THIS INJ.

## 2014-11-17 ENCOUNTER — Ambulatory Visit (HOSPITAL_COMMUNITY)
Admission: RE | Admit: 2014-11-17 | Discharge: 2014-11-17 | Disposition: A | Discharge status: Home / Self Care | Payer: Medicare Other | Source: Ambulatory Visit | Attending: Radiation Oncology | Admitting: Radiation Oncology

## 2014-11-17 DIAGNOSIS — C61 Malignant neoplasm of prostate: Secondary | ICD-10-CM | POA: Insufficient documentation

## 2014-11-17 DIAGNOSIS — C7951 Secondary malignant neoplasm of bone: Secondary | ICD-10-CM | POA: Diagnosis not present

## 2014-11-17 NOTE — Progress Notes (Signed)
  Radiation Oncology         [phone] ________________________________  Name: UMBERTO PAVEK MRN: [id number]  Date: 11/17/2014  DOB: 1945/06/23  Radium-223 Infusion Note  Diagnosis:  Castration resistant prostate cancer with painful bone involvement  Current Infusion:    6  Planned Infusions:  6  Narrative: Mr. JASIRI HANAWALT presented to nuclear medicine for treatment. His most recent blood counts were reviewed.  He remains a good candidate to proceed with Ra-223.  The patient was situated in an infusion suite with a contact barrier placed under his arm. Intravenous access was established, using sterile technique, and a normal saline infusion from a syringe was started.  Micro-dosimetry:  The prescribed radiation activity was assayed and confirmed to be within specified tolerance.  Special Treatment Procedure - Infusion:  The nuclear medicine technologist and I personally verified the dose activity to be delivered as specified in the written directive, and verified the patient identification via 2 separate methods.  The syringe containing the dose was attached to a 3 way stopcock, and then the valve was opened to the patient, and the dose delivered over a minute. No complications were noted.  The total administered dose was 133.0 microcuries in a volume of 7.2 cc.   A saline flush of the line and the syringe that contained the isotope was then performed.  The residual radioactivity in the syringe was 0.00 microcuries, so the actual infused isotope activity was 133.0 microcuries.   Pressure was applied to the venipuncture site, and a compression bandage placed.   Radiation Safety personnel were present to perform the discharge survey, as detailed on their documentation.   After a short period of observation, the patient had his IV removed.  Impression:  The patient tolerated his infusion relatively well.  Plan:  The patient will return in one month for ongoing care.    This document  serves as a record of services personally performed by Tyler Pita, MD. It was created on his behalf by Darcus Austin, a trained medical scribe. The creation of this record is based on the scribe's personal observations and the provider's statements to them. This document has been checked and approved by the attending provider.    ________________________________  Sheral Apley. Tammi Klippel, M.D.

## 2014-11-18 ENCOUNTER — Telehealth: Payer: Self-pay | Admitting: Oncology

## 2014-11-18 ENCOUNTER — Other Ambulatory Visit: Payer: Medicare Other

## 2014-11-18 ENCOUNTER — Other Ambulatory Visit: Payer: Self-pay | Admitting: *Deleted

## 2014-11-18 ENCOUNTER — Ambulatory Visit (HOSPITAL_BASED_OUTPATIENT_CLINIC_OR_DEPARTMENT_OTHER): Payer: Medicare Other | Admitting: Oncology

## 2014-11-18 ENCOUNTER — Ambulatory Visit: Payer: Medicare Other

## 2014-11-18 VITALS — BP 100/70 | HR 70 | Temp 98.8°F | Resp 16 | Wt 196.4 lb

## 2014-11-18 DIAGNOSIS — C61 Malignant neoplasm of prostate: Secondary | ICD-10-CM

## 2014-11-18 DIAGNOSIS — K5900 Constipation, unspecified: Secondary | ICD-10-CM

## 2014-11-18 DIAGNOSIS — D630 Anemia in neoplastic disease: Secondary | ICD-10-CM

## 2014-11-18 DIAGNOSIS — C7951 Secondary malignant neoplasm of bone: Secondary | ICD-10-CM

## 2014-11-18 DIAGNOSIS — D638 Anemia in other chronic diseases classified elsewhere: Secondary | ICD-10-CM

## 2014-11-18 DIAGNOSIS — E291 Testicular hypofunction: Secondary | ICD-10-CM

## 2014-11-18 DIAGNOSIS — R109 Unspecified abdominal pain: Secondary | ICD-10-CM

## 2014-11-18 DIAGNOSIS — M79651 Pain in right thigh: Secondary | ICD-10-CM

## 2014-11-18 DIAGNOSIS — D649 Anemia, unspecified: Secondary | ICD-10-CM

## 2014-11-18 DIAGNOSIS — M549 Dorsalgia, unspecified: Secondary | ICD-10-CM

## 2014-11-18 MED ORDER — OXYCODONE-ACETAMINOPHEN 5-325 MG PO TABS: 1.0000 | ORAL_TABLET | ORAL | Status: DC | PRN

## 2014-11-18 MED ORDER — DENOSUMAB 120 MG/1.7ML ~~LOC~~ SOLN
120.0000 mg | Freq: Once | SUBCUTANEOUS | Status: AC
Start: 2014-11-18 — End: 2014-11-18
  Administered 2014-11-18: 120 mg via SUBCUTANEOUS
  Filled 2014-11-18: qty 1.7

## 2014-11-18 NOTE — Progress Notes (Signed)
Xgeva injection given by Desk nurse

## 2014-11-18 NOTE — Telephone Encounter (Signed)
Gave patient avs report and appointments for December  °

## 2014-11-18 NOTE — Progress Notes (Signed)
Hematology and Oncology Follow Up Visit  Marcus [name] [id number] [birth year] 69 y.o. 11/18/2014 4:30 PM   Principle Diagnosis: 68 year old gentleman diagnosed with prostate cancer in 2005. 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   Prior Therapy:  He is status post prostatectomy and subsequently was treated with salvage radiation therapy in 2007.  In February of 2013 his PSA was up to 22.5 and was started on androgen deprivation.  He developed castration resistant disease with documented bony metastasis despite combined androgen depravation with Lupron and Casodex. His last bone scan done on 09/18/2012 which showed progression of disease. Zytiga a total of 1000 mg started in August of 2014 for the PSA of 32.44. He was discontinued in November 2015 due to a rise in his PSA Xtandi 160 mg daily started in November 2015. Stopped on 05/13/2014 due to progression of disease.   Current therapy:  Xofigo under the care of Dr. Tammi Klippel. First treatment given 06/09/2014. He is S/P 6 out of 6 completed on 11/17/2014. He is continued on Lupron at [facility] urology. His Xgeva to be given and the [place] since 06/2013.   Interim History:  Marcus [name] presents today for a followup visit with his wife. Since his last visit, he reports complained of mid epigastric abdominal pain and overall not feeling well last week or so. He was evaluated by his primary care provider and diagnosed with upper respiratory tract infection and scheduled to have an endoscopy was abdominal pain. He reported his pain is actually improved but does take Percocet as needed. He has not reported any nausea or vomiting. He is able to eat and maintain his weight without any problems.  He continues to be on Xofigo without any complications and have received 6 out of 6 treatments. He has not reported any worsening back pain or bone pain at this time.  He continues to be active and performs  activities of daily living. He is able to drive without any decline and attends to her other activities.  His pain has been under excellent control with long-acting morphine and breakthrough oxycodone. He takes oxycodone about every 5-6 hours. He has been scheduling his breakthrough pain medication at times to prevent in crisis. The most part his pain is under excellent control.  He does not report any abdominal pain or distention. He does not report any hematochezia or bleeding per rectum. He does not report any nausea or vomiting. Did not report any urinary symptoms of hematuria dysuria or incontinence.  Did not report any lower extremity swelling. He has not reported any neurological symptoms including headaches blurred vision or seizures. He has not reported any chest pain or difficulty breathing. Has not reported any cough or hemoptysis or wheezing. Has not reported any major changes in his bowel habits. He did not report any skin changes or petechiae. Has not reported any lymphadenopathy. Remainder of his review of system is unremarkable.  Medications: I have reviewed the patient's current medications.  Current Outpatient Prescriptions  Medication Sig Dispense Refill  . Albuterol Sulfate (PROAIR RESPICLICK) 497 (90 BASE) MCG/ACT AEPB Inhale 2 puffs into the lungs 4 (four) times daily as needed. 1 each 1  . cholecalciferol (VITAMIN D) 1000 UNITS tablet Take 1,000 Units by mouth daily.    Marland Kitchen lisinopril-hydrochlorothiazide (PRINZIDE,ZESTORETIC) 20-25 MG per tablet TAKE ONE TABLET BY MOUTH ONCE DAILY 90 tablet 0  . megestrol (MEGACE) 40 MG/ML suspension Take 10 mLs (400 mg total) by mouth daily.  480 mL 0  . morphine (MS CONTIN) 30 MG 12 hr tablet Take 1 tablet (30 mg total) by mouth every 12 (twelve) hours. 60 tablet 0  . oxyCODONE-acetaminophen (PERCOCET/ROXICET) 5-325 MG tablet Take 1-2 tablets by mouth every 4 (four) hours as needed for severe pain (Do not take more than 12 tablets per 24 hours.).  120 tablet 0  . prochlorperazine (COMPAZINE) 10 MG tablet TAKE ONE TABLET BY MOUTH EVERY 6 HOURS AS NEEDED FOR NAUSEA AND  VOMITTING 30 tablet 0  . senna-docusate (SENNA S) 8.6-50 MG per tablet Take 1 tablet by mouth at bedtime. 30 tablet 3  . XOFIGO 27 MCCI/ML SOLN Inject 50 KBq/kg into the vein every 30 (thirty) days. For 6 monthly infusions in nuclear medicine 6 each 0   Current Facility-Administered Medications  Medication Dose Route Frequency Provider Last Rate Last Dose  . denosumab (XGEVA) injection 120 mg  120 mg Subcutaneous Once Wyatt Portela, MD         Allergies: No Known Allergies   Physical Exam: Blood pressure 100/70, pulse 70, temperature 98.8 F (37.1 C), temperature source Oral, resp. rate 16, weight 196 lb 6 oz (89.075 kg), SpO2 100 %. ECOG: 1 General appearance: alert awake gentleman appeared without distress. Head: Normocephalic, without obvious abnormality oral mucosa is moist and pink. Neck: no adenopathy and No masses or thyromegaly. Lymph nodes: Cervical, supraclavicular, and axillary nodes normal. Heart:regular rate and rhythm, S1, S2. No murmurs rubs or gallops. Lung:chest clear, no wheezing, rales. No dullness to percussion. Abdomen: soft, non-tender, without masses or organomegaly no shifting dullness EXT:no erythema, induration, no edema noted. Neurological: No deficits.    Lab Results: Lab Results  Component Value Date   WBC 9.0 11/14/2014   HGB 8.8* 11/14/2014   HCT 26.1* 11/14/2014   MCV 89.8 11/14/2014   PLT 399 11/14/2014     Chemistry      Component Value Date/Time   NA 135 11/08/2014 0001   NA 142 10/13/2014 1101   K 4.0 11/08/2014 0001   K 4.0 10/13/2014 1101   CL 101 11/08/2014 0001   CO2 26 11/08/2014 0001   CO2 24 10/13/2014 1101   BUN 16 11/08/2014 0001   BUN 19.7 10/13/2014 1101   CREATININE 1.09 11/08/2014 0001   CREATININE 1.2 10/13/2014 1101      Component Value Date/Time   CALCIUM 9.1 11/08/2014 0001   CALCIUM 9.7  10/13/2014 1101   ALKPHOS 75 11/08/2014 0001   ALKPHOS 89 10/13/2014 1101   AST 17 11/08/2014 0001   AST 16 10/13/2014 1101   ALT 16 11/08/2014 0001   ALT 14 10/13/2014 1101   BILITOT 0.6 11/08/2014 0001   BILITOT <0.30 10/13/2014 1101       Results for Marcus [name], Marcus [name] (MRN [id number]) as of 11/18/2014 16:35  Ref. Range 09/06/2014 12:41 10/13/2014 11:00 11/10/2014 09:29  PSA Latest Ref Range: <=4.00 ng/mL 106.50 (H) 71.68 (H) 134.80 (H)          Impression and Plan:  69 year old gentleman with the following issues:  1. Castration resistant prostate cancer with metastatic disease to the bone. He has developed castration resistant disease and have progressed on both Zytiga and most recently on xtandi. His most recent PSA was up to 209.  He is status post 6treatments with Xofigo under the care of Dr. Tammi Klippel. He tolerated the treatment without any difficulty. His PSA and alkaline phosphatase have been declining indicating response to therapy. His PSA did in October to  134.  I have recommended that restaging workup to be done in the near future and follow his PSA for the next one to 2 months. If his PSA continues to rise and imaging studies showed progression of disease, different salvage therapy will be needed in the form of systemic chemotherapy. He prefers to have imaging studies done in January which we will arrange for the next visit.  2. Androgen depravation: His last testosterone is 91 in December 2016 and will be repeated in the future and administer Lupron as needed.  3. Bone directed therapy: He is on Xgeva monthly which he as tolerated it well. Complications such as hypocalcemia and osteonecrosis of the jaw were reviewed and continue to be reviewed future visits.  4.Right thigh pain/back pain: His pain is well-controlled with long-acting morphine and breakthrough Percocet. He takes 1-2 tablets a day at most.  5. Constipation: MiraLax in the morning and continue Senokot S  as well which have helped his symptoms.  6. Anemia: Likely multifactorial in nature. He has anemia of chronic disease, malignancy also related to [facility]. He is minimally symptomatic at this time and we will continue to monitor him. hemoglobin remained stable.  7. Abdominal pain: Likely related to cancer progression but always a possibility. If his pain recurs or intensifies, we will move his scans to sooner than later. He will have GI workup in the near future including an endoscopy. His pain seems to the have improved recently and able to eat and maintain his weight.  8. Followup: Will be in about 4 to 5 weeks.  [facility] MD 10/28/20164:30 PM

## 2014-11-22 NOTE — Telephone Encounter (Signed)
Ok, then have him hold off on taking the BP medication and f/u with Dr. Redmond School in a week or 2 to recheck on BP

## 2014-11-22 NOTE — Telephone Encounter (Signed)
Said he does not check it at home and does not remember what it was the last time he went to a doctor

## 2014-11-22 NOTE — Telephone Encounter (Signed)
Call and see what Blood Pressure numbers have been looking like?   Has he resumed the BP medication?

## 2014-11-23 ENCOUNTER — Ambulatory Visit: Payer: Medicare Other | Admitting: Gastroenterology

## 2014-11-23 NOTE — Telephone Encounter (Signed)
Pt informed

## 2014-12-07 ENCOUNTER — Other Ambulatory Visit: Payer: Self-pay | Admitting: Oncology

## 2014-12-07 DIAGNOSIS — C61 Malignant neoplasm of prostate: Secondary | ICD-10-CM

## 2014-12-26 ENCOUNTER — Encounter: Payer: Self-pay | Admitting: Family Medicine

## 2014-12-26 ENCOUNTER — Ambulatory Visit (INDEPENDENT_AMBULATORY_CARE_PROVIDER_SITE_OTHER): Payer: Medicare Other | Admitting: Family Medicine

## 2014-12-26 ENCOUNTER — Telehealth: Payer: Self-pay | Admitting: *Deleted

## 2014-12-26 VITALS — BP 110/78 | HR 68 | Temp 98.5°F | Ht 74.0 in | Wt 192.0 lb

## 2014-12-26 DIAGNOSIS — D649 Anemia, unspecified: Secondary | ICD-10-CM | POA: Insufficient documentation

## 2014-12-26 DIAGNOSIS — R0609 Other forms of dyspnea: Secondary | ICD-10-CM | POA: Diagnosis not present

## 2014-12-26 DIAGNOSIS — C61 Malignant neoplasm of prostate: Secondary | ICD-10-CM | POA: Diagnosis not present

## 2014-12-26 DIAGNOSIS — R5383 Other fatigue: Secondary | ICD-10-CM | POA: Diagnosis not present

## 2014-12-26 NOTE — Telephone Encounter (Signed)
Scheduled Echocardiogram at [facility] on 12-27-14 at 2:00pm, no prep.  Called and left msg on both home and mobile phones to this effect. PA# [phone]

## 2014-12-26 NOTE — Progress Notes (Signed)
   Subjective:    Patient ID: Marcus [name], male    DOB: Oct 16, 1945, 69 y.o.   MRN: [id number]  HPI He complains of a several month history of increasing difficulty with shortness of breath with exertion. He dates this to roughly 8 months ago. Since then has been seen periodically for upper respiratory problems as well as fatigue. He's had no chest pain, PND, sleeps on one pillow. He does have a history of multifactorial anemia. He has been seen by hematology and oncology in the past for this and his underlying prostate cancer.   Review of Systems     Objective:   Physical Exam Alert and in no distress. Tympanic membranes and canals are normal. Pharyngeal area is normal. Neck is supple without adenopathy or thyromegaly. Cardiac exam shows a regular sinus rhythm without murmurs or gallops. Lungs are clear to auscultation. EKG shows no acute changes       Assessment & Plan:  DOE (dyspnea on exertion) - Plan: EKG 12-Lead, Echocardiogram  Other fatigue  Malignant neoplasm of prostate (HCC) his symptoms are at least suggestive of possible angina. The EKG is negative however I will get an ultrasound and evaluate this further. This could all be multifactorial considering his underlying diagnoses. Over 25 minutes, greater than 50% spent in counseling and coordination of care.

## 2014-12-27 ENCOUNTER — Ambulatory Visit (HOSPITAL_COMMUNITY): Payer: Medicare Other

## 2014-12-28 ENCOUNTER — Telehealth: Payer: Self-pay | Admitting: Oncology

## 2014-12-28 ENCOUNTER — Ambulatory Visit (HOSPITAL_COMMUNITY)
Admission: RE | Admit: 2014-12-28 | Discharge: 2014-12-28 | Disposition: A | Discharge status: Home / Self Care | Payer: Medicare Other | Source: Ambulatory Visit | Attending: Oncology | Admitting: Oncology

## 2014-12-28 ENCOUNTER — Ambulatory Visit (HOSPITAL_BASED_OUTPATIENT_CLINIC_OR_DEPARTMENT_OTHER): Payer: Medicare Other

## 2014-12-28 ENCOUNTER — Other Ambulatory Visit (HOSPITAL_BASED_OUTPATIENT_CLINIC_OR_DEPARTMENT_OTHER): Payer: Medicare Other

## 2014-12-28 ENCOUNTER — Ambulatory Visit (HOSPITAL_BASED_OUTPATIENT_CLINIC_OR_DEPARTMENT_OTHER): Payer: Medicare Other | Admitting: Oncology

## 2014-12-28 ENCOUNTER — Other Ambulatory Visit: Payer: Medicare Other

## 2014-12-28 VITALS — BP 102/57 | HR 88 | Temp 98.5°F | Resp 18 | Ht 74.0 in | Wt 192.2 lb

## 2014-12-28 DIAGNOSIS — E291 Testicular hypofunction: Secondary | ICD-10-CM

## 2014-12-28 DIAGNOSIS — M79651 Pain in right thigh: Secondary | ICD-10-CM

## 2014-12-28 DIAGNOSIS — C61 Malignant neoplasm of prostate: Secondary | ICD-10-CM

## 2014-12-28 DIAGNOSIS — C7951 Secondary malignant neoplasm of bone: Secondary | ICD-10-CM | POA: Diagnosis not present

## 2014-12-28 DIAGNOSIS — D638 Anemia in other chronic diseases classified elsewhere: Secondary | ICD-10-CM

## 2014-12-28 DIAGNOSIS — M549 Dorsalgia, unspecified: Secondary | ICD-10-CM

## 2014-12-28 DIAGNOSIS — D630 Anemia in neoplastic disease: Secondary | ICD-10-CM

## 2014-12-28 LAB — CBC WITH DIFFERENTIAL/PLATELET
BASO%: 0.1 % (ref 0.0–2.0)
BASOS ABS: 0 10*3/uL (ref 0.0–0.1)
EOS ABS: 0.1 10*3/uL (ref 0.0–0.5)
EOS%: 1.9 % (ref 0.0–7.0)
HCT: 19.4 % — ABNORMAL LOW (ref 38.4–49.9)
HGB: 6.1 g/dL — CL (ref 13.0–17.1)
LYMPH%: 9 % — AB (ref 14.0–49.0)
MCH: 27.6 pg (ref 27.2–33.4)
MCHC: 31.4 g/dL — AB (ref 32.0–36.0)
MCV: 87.8 fL (ref 79.3–98.0)
MONO#: 0.2 10*3/uL (ref 0.1–0.9)
MONO%: 3.3 % (ref 0.0–14.0)
NEUT#: 6.3 10*3/uL (ref 1.5–6.5)
NEUT%: 85.7 % — AB (ref 39.0–75.0)
NRBC: 1 % — AB (ref 0–0)
PLATELETS: 315 10*3/uL (ref 140–400)
RBC: 2.21 10*6/uL — AB (ref 4.20–5.82)
RDW: 16.5 % — ABNORMAL HIGH (ref 11.0–14.6)
WBC: 7.3 10*3/uL (ref 4.0–10.3)
lymph#: 0.7 10*3/uL — ABNORMAL LOW (ref 0.9–3.3)

## 2014-12-28 LAB — COMPREHENSIVE METABOLIC PANEL
ALT: 76 U/L — ABNORMAL HIGH (ref 0–55)
ANION GAP: 12 meq/L — AB (ref 3–11)
AST: 30 U/L (ref 5–34)
Albumin: 2.4 g/dL — ABNORMAL LOW (ref 3.5–5.0)
Alkaline Phosphatase: 92 U/L (ref 40–150)
BUN: 19.3 mg/dL (ref 7.0–26.0)
CHLORIDE: 106 meq/L (ref 98–109)
CO2: 19 meq/L — AB (ref 22–29)
Calcium: 10.7 mg/dL — ABNORMAL HIGH (ref 8.4–10.4)
Creatinine: 1.3 mg/dL (ref 0.7–1.3)
EGFR: 67 mL/min/{1.73_m2} — AB (ref >90)
Glucose: 128 mg/dl (ref 70–140)
Potassium: 4.4 mEq/L (ref 3.5–5.1)
Sodium: 137 mEq/L (ref 136–145)
TOTAL PROTEIN: 7.3 g/dL (ref 6.4–8.3)
Total Bilirubin: <0.3 mg/dL (ref 0.20–1.20)

## 2014-12-28 LAB — TECHNOLOGIST REVIEW

## 2014-12-28 LAB — ABO/RH: ABO/RH(D): O POS

## 2014-12-28 LAB — HOLD TUBE, BLOOD BANK

## 2014-12-28 MED ORDER — DENOSUMAB 120 MG/1.7ML ~~LOC~~ SOLN
120.0000 mg | Freq: Once | SUBCUTANEOUS | Status: AC
  Administered 2014-12-28: 120 mg via SUBCUTANEOUS
  Filled 2014-12-28: qty 1.7

## 2014-12-28 MED ORDER — OXYCODONE-ACETAMINOPHEN 5-325 MG PO TABS: 1.0000 | ORAL_TABLET | ORAL | Status: DC | PRN

## 2014-12-28 NOTE — Telephone Encounter (Signed)
Marcus [name] inj nurse advised to move lab from 12.9 for blood to today...done.Marland Kitchen

## 2014-12-28 NOTE — Telephone Encounter (Signed)
Gave and printed appt sched and avs fo rpt for DEC and Jan ...gv barium °

## 2014-12-28 NOTE — Progress Notes (Signed)
Hematology and Oncology Follow Up Visit  DUSTINE WEISNER [id number] 12-06-1945 69 y.o. 12/28/2014 1:33 PM   Principle Diagnosis: 69 year old gentleman diagnosed with prostate cancer in 2005. 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   Prior Therapy:  He is status post prostatectomy and subsequently was treated with salvage radiation therapy in 2007.  In February of 2013 his PSA was up to 22.5 and was started on androgen deprivation.  He developed castration resistant disease with documented bony metastasis despite combined androgen depravation with Lupron and Casodex. His last bone scan done on 09/18/2012 which showed progression of disease. Zytiga a total of 1000 mg started in August of 2014 for the PSA of 32.44. He was discontinued in November 2015 due to a rise in his PSA Xtandi 160 mg daily started in November 2015. Stopped on 05/13/2014 due to progression of disease.  Xofigo under the care of Dr. Tammi Klippel. First treatment given 06/09/2014. He is S/P 6 out of 6 completed on 11/17/2014.  Current therapy:  He is continued on Lupron at [facility] urology. His Xgeva to be given and the [place] since 06/2013.  Under evaluation for possible salvage therapy in the near future.  Interim History:  Mr. Sargsyan presents today for a followup visit with his wife. Since his last visit, he reports increased fatigue and tiredness. He has reported some occasional dyspnea on exertion but that have improved as of late. He is scheduled to have an echocardiogram in the near future. He is not report any major decline in his health at this time. He continues to drive and attends to activities of daily living. Has not reported any chest pain or cough. Has not reported any falls or syncope.  His pain has been under excellent control with long-acting morphine and breakthrough oxycodone. He takes oxycodone very infrequently at this time. He feels his overall pain has  improved after completing Xofigo.   He does not report any abdominal pain or distention. He does not report any hematochezia or bleeding per rectum. He does not report any nausea or vomiting. Did not report any urinary symptoms of hematuria dysuria or incontinence. He has not reported any neurological symptoms including headaches blurred vision or seizures. He has not reported any chest pain or difficulty breathing. Has not reported any cough or hemoptysis or wheezing. Has not reported any major changes in his bowel habits. He did not report any skin changes or petechiae. Has not reported any lymphadenopathy. Remainder of his review of system is unremarkable.  Medications: I have reviewed the patient's current medications.  Current Outpatient Prescriptions  Medication Sig Dispense Refill  . Albuterol Sulfate (PROAIR RESPICLICK) 123XX123 (90 BASE) MCG/ACT AEPB Inhale 2 puffs into the lungs 4 (four) times daily as needed. 1 each 1  . cholecalciferol (VITAMIN D) 1000 UNITS tablet Take 1,000 Units by mouth daily.    Marland Kitchen lisinopril-hydrochlorothiazide (PRINZIDE,ZESTORETIC) 20-25 MG per tablet TAKE ONE TABLET BY MOUTH ONCE DAILY 90 tablet 0  . megestrol (MEGACE) 40 MG/ML suspension Take 10 mLs (400 mg total) by mouth daily. 480 mL 0  . morphine (MS CONTIN) 30 MG 12 hr tablet Take 1 tablet (30 mg total) by mouth every 12 (twelve) hours. 60 tablet 0  . oxyCODONE-acetaminophen (PERCOCET/ROXICET) 5-325 MG tablet Take 1-2 tablets by mouth every 4 (four) hours as needed for severe pain (Do not take more than 12 tablets per 24 hours.). 120 tablet 0  . prochlorperazine (COMPAZINE) 10 MG tablet TAKE  ONE TABLET BY MOUTH EVERY 6 HOURS AS NEEDED FOR NAUSEA AND  VOMITTING 30 tablet 1  . senna-docusate (SENNA S) 8.6-50 MG per tablet Take 1 tablet by mouth at bedtime. 30 tablet 3  . XOFIGO 27 MCCI/ML SOLN Inject 50 KBq/kg into the vein every 30 (thirty) days. For 6 monthly infusions in nuclear medicine 6 each 0   No current  facility-administered medications for this visit.     Allergies: No Known Allergies   Physical Exam: Blood pressure 102/57, pulse 88, temperature 98.5 F (36.9 C), temperature source Oral, resp. rate 18, height 6\' 2"  (1.88 m), weight 192 lb 3.2 oz (87.181 kg), SpO2 100 %. ECOG: 1 General appearance: alert awake gentleman not in any distress. Head: Normocephalic, without obvious abnormality oral ulcers or lesions. Neck: no adenopathy and No masses or thyromegaly. Lymph nodes: Cervical, supraclavicular, and axillary nodes normal. Heart:regular rate and rhythm, S1, S2. No murmurs rubs or gallops. Lung:chest clear, no wheezing, rales.  Abdomen: soft, non-tender, without masses or organomegaly no rebound or guarding. EXT:no erythema, induration, no edema noted. Neurological: No deficits.    Lab Results: Lab Results  Component Value Date   WBC 7.3 12/28/2014   HGB 6.1* 12/28/2014   HCT 19.4* 12/28/2014   MCV 87.8 12/28/2014   PLT 315 12/28/2014     Chemistry      Component Value Date/Time   NA 135 11/08/2014 0001   NA 142 10/13/2014 1101   K 4.0 11/08/2014 0001   K 4.0 10/13/2014 1101   CL 101 11/08/2014 0001   CO2 26 11/08/2014 0001   CO2 24 10/13/2014 1101   BUN 16 11/08/2014 0001   BUN 19.7 10/13/2014 1101   CREATININE 1.09 11/08/2014 0001   CREATININE 1.2 10/13/2014 1101      Component Value Date/Time   CALCIUM 9.1 11/08/2014 0001   CALCIUM 9.7 10/13/2014 1101   ALKPHOS 75 11/08/2014 0001   ALKPHOS 89 10/13/2014 1101   AST 17 11/08/2014 0001   AST 16 10/13/2014 1101   ALT 16 11/08/2014 0001   ALT 14 10/13/2014 1101   BILITOT 0.6 11/08/2014 0001   BILITOT <0.30 10/13/2014 1101       Results for LUCIAN, SCHUENKE (MRN [id number]) as of 11/18/2014 16:35  Ref. Range 09/06/2014 12:41 10/13/2014 11:00 11/10/2014 09:29  PSA Latest Ref Range: <=4.00 ng/mL 106.50 (H) 71.68 (H) 134.80 (H)          Impression and Plan:  69 year old gentleman with the following  issues:  1. Castration resistant prostate cancer with metastatic disease to the bone. He has developed castration resistant disease and have progressed on both Zytiga and most recently on xtandi. His most recent PSA was up to 209.  He is status post 6 treatments with Xofigo under the care of Dr. Tammi Klippel. He tolerated the treatment without any difficulty. His pain level has much improved after the conclusion of this therapy.  The plan is to restage him at this time with a repeat PSA, CT scan and a bone scan and discuss treatment options at this time. If his disease has progressed salvage chemotherapy would be the next option. His disease remains stable we can continue to observe and for a period of time.  2. Androgen depravation: Lupron will be administered if his testosterone increases. His last testosterone was less than 19 done in September 2016.  3. Bone directed therapy: He is on Xgeva monthly which he as tolerated it well. Complications such as hypocalcemia and osteonecrosis  of the jaw were reviewed and he is willing to proceed at this time.  4.Right thigh pain/back pain: His pain is well-controlled with long-acting morphine and breakthrough Percocet. He takes very little breakthrough pain medication.  5. Constipation: MiraLax in the morning and continue Senokot S as well which have helped his symptoms.  6. Anemia: Likely multifactorial in nature. He has anemia of chronic disease, malignancy also related to [facility]. Although he is feeling better today he had felt fatigued, tired and had dyspnea on exertion. I discussed the risks and benefits of her transfusions and he is agreeable to proceed. We'll set that up for him in the near future.  7. Abdominal pain: This have resolved at this time and he is feeling better.  8. Followup: Will be in about 4 weeks after repeat staging workup.  [facility] MD 12/7/20161:33 PM

## 2014-12-29 LAB — PSA: PSA: 152.1 ng/mL — ABNORMAL HIGH (ref <=4.00)

## 2014-12-30 ENCOUNTER — Other Ambulatory Visit: Payer: Medicare Other

## 2014-12-30 ENCOUNTER — Ambulatory Visit (HOSPITAL_BASED_OUTPATIENT_CLINIC_OR_DEPARTMENT_OTHER): Payer: Medicare Other

## 2014-12-30 VITALS — BP 103/61 | HR 77 | Temp 98.6°F | Resp 20

## 2014-12-30 DIAGNOSIS — D630 Anemia in neoplastic disease: Secondary | ICD-10-CM | POA: Diagnosis not present

## 2014-12-30 DIAGNOSIS — C61 Malignant neoplasm of prostate: Secondary | ICD-10-CM | POA: Diagnosis not present

## 2014-12-30 DIAGNOSIS — D638 Anemia in other chronic diseases classified elsewhere: Secondary | ICD-10-CM | POA: Diagnosis not present

## 2014-12-30 LAB — PREPARE RBC (CROSSMATCH)

## 2014-12-30 MED ORDER — SODIUM CHLORIDE 0.9 % IV SOLN
250.0000 mL | Freq: Once | INTRAVENOUS | Status: AC
  Administered 2014-12-30: 250 mL via INTRAVENOUS

## 2014-12-30 MED ORDER — DIPHENHYDRAMINE HCL 25 MG PO CAPS
25.0000 mg | ORAL_CAPSULE | Freq: Once | ORAL | Status: AC
  Administered 2014-12-30: 25 mg via ORAL

## 2014-12-30 MED ORDER — ACETAMINOPHEN 325 MG PO TABS
ORAL_TABLET | ORAL | Status: AC
  Filled 2014-12-30: qty 2

## 2014-12-30 MED ORDER — DIPHENHYDRAMINE HCL 25 MG PO CAPS
ORAL_CAPSULE | ORAL | Status: AC
  Filled 2014-12-30: qty 1

## 2014-12-30 MED ORDER — ACETAMINOPHEN 325 MG PO TABS
650.0000 mg | ORAL_TABLET | Freq: Once | ORAL | Status: AC
  Administered 2014-12-30: 650 mg via ORAL

## 2014-12-30 NOTE — Progress Notes (Signed)
1115- 15 min VS obtained and stable; re-educated on signs of transfusion reaction and pt voices understanding.  No concerns at this time. Transfusion rate increased to 200 mL/hr

## 2014-12-30 NOTE — Patient Instructions (Signed)
 Blood Transfusion  A blood transfusion is a procedure in which you receive donated blood through an IV tube. You may need a blood transfusion because of illness, surgery, or injury. The blood may come from a donor, or it may be your own blood that you donated previously. The blood given in a transfusion is made up of different types of cells. You may receive:  Red blood cells. These carry oxygen and replace lost blood.  Platelets. These control bleeding.  Plasma. Thishelps blood to clot. If you have hemophilia or another clotting disorder, you may also receive other types of blood products. LET YOUR HEALTH CARE PROVIDER KNOW ABOUT:  Any allergies you have.  All medicines you are taking, including vitamins, herbs, eye drops, creams, and over-the-counter medicines.  Previous problems you or members of your family have had with the use of anesthetics.  Any blood disorders you have.  Previous surgeries you have had.  Any medical conditions you may have.  Any previous reactions you have had during a blood transfusion.  RISKS AND COMPLICATIONS Generally, this is a safe procedure. However, problems may occur, including:  Having an allergic reaction to something in the donated blood.  Fever. This may be a reaction to the white blood cells in the transfused blood.  Iron overload. This can happen from having many transfusions.  Transfusion-related acute lung injury (TRALI). This is a rare reaction that causes lung damage. The cause is not known.TRALI can occur within hours of a transfusion or several days later.  Sudden (acute) or delayed hemolytic reactions. This happens if your blood does not match the cells in your transfusion. Your body's defense system (immune system) may try to attack the new cells. This complication is rare.  Infection. This is rare. BEFORE THE PROCEDURE  You may have a blood test to determine your blood type. This is necessary to know what kind of blood  your body will accept.  If you are going to have a planned surgery, you may donate your own blood. This may be done in case you need to have a transfusion.  If you have had an allergic reaction to a transfusion in the past, you may be given medicine to help prevent a reaction. Take this medicine only as directed by your health care provider.  You will have your temperature, blood pressure, and pulse monitored before the transfusion. PROCEDURE   An IV will be started in your hand or arm.  The bag of donated blood will be attached to your IV tube and given into your vein.  Your temperature, blood pressure, and pulse will be monitored regularly during the transfusion. This monitoring is done to detect early signs of a transfusion reaction.  If you have any signs or symptoms of a reaction, your transfusion will be stopped and you may be given medicine.  When the transfusion is over, your IV will be removed.  Pressure may be applied to the IV site for a few minutes.  A bandage (dressing) will be applied. The procedure may vary among health care providers and hospitals. AFTER THE PROCEDURE  Your blood pressure, temperature, and pulse will be monitored regularly.   This information is not intended to replace advice given to you by your health care provider. Make sure you discuss any questions you have with your health care provider.   Document Released: 01/05/2000 Document Revised: 01/28/2014 Document Reviewed: 11/17/2013 Elsevier Interactive Patient Education 2016 [employer]   Anemia, Nonspecific Anemia is a   condition in which the concentration of red blood cells or hemoglobin in the blood is below normal. Hemoglobin is a substance in red blood cells that carries oxygen to the tissues of the body. Anemia results in not enough oxygen reaching these tissues.  CAUSES  Common causes of anemia include:   Excessive bleeding. Bleeding may be internal or external. This includes excessive  bleeding from periods (in women) or from the intestine.   Poor nutrition.   Chronic kidney, thyroid, and liver disease.  Bone marrow disorders that decrease red blood cell production.  Cancer and treatments for cancer.  HIV, AIDS, and their treatments.  Spleen problems that increase red blood cell destruction.  Blood disorders.  Excess destruction of red blood cells due to infection, medicines, and autoimmune disorders. SIGNS AND SYMPTOMS   Minor weakness.   Dizziness.   Headache.  Palpitations.   Shortness of breath, especially with exercise.   Paleness.  Cold sensitivity.  Indigestion.  Nausea.  Difficulty sleeping.  Difficulty concentrating. Symptoms may occur suddenly or they may develop slowly.  DIAGNOSIS  Additional blood tests are often needed. These help your health care provider determine the best treatment. Your health care provider will check your stool for blood and look for other causes of blood loss.  TREATMENT  Treatment varies depending on the cause of the anemia. Treatment can include:   Supplements of iron, vitamin B12, or folic acid.   Hormone medicines.   A blood transfusion. This may be needed if blood loss is severe.   Hospitalization. This may be needed if there is significant continual blood loss.   Dietary changes.  Spleen removal. HOME CARE INSTRUCTIONS Keep all follow-up appointments. It often takes many weeks to correct anemia, and having your health care provider check on your condition and your response to treatment is very important. SEEK IMMEDIATE MEDICAL CARE IF:   You develop extreme weakness, shortness of breath, or chest pain.   You become dizzy or have trouble concentrating.  You develop heavy vaginal bleeding.   You develop a rash.   You have bloody or black, tarry stools.   You faint.   You vomit up blood.   You vomit repeatedly.   You have abdominal pain.  You have a fever or  persistent symptoms for more than 2-3 days.   You have a fever and your symptoms suddenly get worse.   You are dehydrated.  MAKE SURE YOU:  Understand these instructions.  Will watch your condition.  Will get help right away if you are not doing well or get worse.   This information is not intended to replace advice given to you by your health care provider. Make sure you discuss any questions you have with your health care provider.   Document Released: 02/15/2004 Document Revised: 09/09/2012 Document Reviewed: 07/03/2012 Elsevier Interactive Patient Education 2016 Elsevier Inc.  

## 2015-01-01 LAB — TYPE AND SCREEN
ABO/RH(D): O POS
Antibody Screen: NEGATIVE
UNIT DIVISION: 0
Unit division: 0

## 2015-01-04 ENCOUNTER — Ambulatory Visit (HOSPITAL_COMMUNITY): Payer: Medicare Other

## 2015-01-18 ENCOUNTER — Ambulatory Visit (HOSPITAL_COMMUNITY)
Admission: RE | Admit: 2015-01-18 | Discharge: 2015-01-18 | Disposition: A | Discharge status: Home / Self Care | Payer: Medicare Other | Source: Ambulatory Visit | Attending: Family Medicine | Admitting: Family Medicine

## 2015-01-18 DIAGNOSIS — R0609 Other forms of dyspnea: Secondary | ICD-10-CM | POA: Diagnosis not present

## 2015-01-18 DIAGNOSIS — R0600 Dyspnea, unspecified: Secondary | ICD-10-CM | POA: Diagnosis not present

## 2015-01-18 DIAGNOSIS — E785 Hyperlipidemia, unspecified: Secondary | ICD-10-CM | POA: Insufficient documentation

## 2015-01-18 DIAGNOSIS — I340 Nonrheumatic mitral (valve) insufficiency: Secondary | ICD-10-CM | POA: Insufficient documentation

## 2015-01-18 DIAGNOSIS — I77810 Thoracic aortic ectasia: Secondary | ICD-10-CM | POA: Insufficient documentation

## 2015-01-18 DIAGNOSIS — I517 Cardiomegaly: Secondary | ICD-10-CM | POA: Diagnosis not present

## 2015-01-18 DIAGNOSIS — I10 Essential (primary) hypertension: Secondary | ICD-10-CM | POA: Insufficient documentation

## 2015-01-18 NOTE — Progress Notes (Signed)
*  PRELIMINARY RESULTS* Echocardiogram 2D Echocardiogram has been performed.  Marcus [name] 01/18/2015, 11:33 AM

## 2015-01-20 ENCOUNTER — Other Ambulatory Visit: Payer: Self-pay | Admitting: Family Medicine

## 2015-01-24 ENCOUNTER — Ambulatory Visit (HOSPITAL_COMMUNITY)
Admission: RE | Admit: 2015-01-24 | Discharge: 2015-01-24 | Disposition: A | Discharge status: Home / Self Care | Payer: Medicare Other | Source: Ambulatory Visit | Attending: Oncology | Admitting: Oncology

## 2015-01-24 ENCOUNTER — Encounter (HOSPITAL_COMMUNITY)
Admission: RE | Admit: 2015-01-24 | Discharge: 2015-01-24 | Disposition: A | Discharge status: Home / Self Care | Payer: Medicare Other | Source: Ambulatory Visit | Attending: Oncology | Admitting: Oncology

## 2015-01-24 ENCOUNTER — Other Ambulatory Visit (HOSPITAL_BASED_OUTPATIENT_CLINIC_OR_DEPARTMENT_OTHER): Payer: Medicare Other

## 2015-01-24 ENCOUNTER — Encounter (HOSPITAL_COMMUNITY): Payer: Self-pay

## 2015-01-24 ENCOUNTER — Encounter (HOSPITAL_COMMUNITY): Admission: RE | Admit: 2015-01-24 | Payer: Medicare Other | Source: Ambulatory Visit

## 2015-01-24 DIAGNOSIS — Z923 Personal history of irradiation: Secondary | ICD-10-CM | POA: Diagnosis not present

## 2015-01-24 DIAGNOSIS — C7951 Secondary malignant neoplasm of bone: Secondary | ICD-10-CM | POA: Insufficient documentation

## 2015-01-24 DIAGNOSIS — C61 Malignant neoplasm of prostate: Secondary | ICD-10-CM

## 2015-01-24 DIAGNOSIS — Z9079 Acquired absence of other genital organ(s): Secondary | ICD-10-CM | POA: Diagnosis not present

## 2015-01-24 DIAGNOSIS — Z8546 Personal history of malignant neoplasm of prostate: Secondary | ICD-10-CM | POA: Insufficient documentation

## 2015-01-24 DIAGNOSIS — K449 Diaphragmatic hernia without obstruction or gangrene: Secondary | ICD-10-CM | POA: Insufficient documentation

## 2015-01-24 LAB — CBC WITH DIFFERENTIAL/PLATELET
BASO%: 0.5 % (ref 0.0–2.0)
BASOS ABS: 0 10*3/uL (ref 0.0–0.1)
EOS ABS: 0.1 10*3/uL (ref 0.0–0.5)
EOS%: 2 % (ref 0.0–7.0)
HEMATOCRIT: 21 % — AB (ref 38.4–49.9)
HEMOGLOBIN: 7.1 g/dL — AB (ref 13.0–17.1)
LYMPH#: 0.7 10*3/uL — AB (ref 0.9–3.3)
LYMPH%: 9.2 % — ABNORMAL LOW (ref 14.0–49.0)
MCH: 29.6 pg (ref 27.2–33.4)
MCHC: 33.7 g/dL (ref 32.0–36.0)
MCV: 87.6 fL (ref 79.3–98.0)
MONO#: 0.4 10*3/uL (ref 0.1–0.9)
MONO%: 6 % (ref 0.0–14.0)
NEUT%: 82.3 % — ABNORMAL HIGH (ref 39.0–75.0)
NEUTROS ABS: 6 10*3/uL (ref 1.5–6.5)
Platelets: 308 10*3/uL (ref 140–400)
RBC: 2.4 10*6/uL — ABNORMAL LOW (ref 4.20–5.82)
RDW: 17.3 % — AB (ref 11.0–14.6)
WBC: 7.3 10*3/uL (ref 4.0–10.3)

## 2015-01-24 LAB — COMPREHENSIVE METABOLIC PANEL
ALT: 86 U/L — ABNORMAL HIGH (ref 0–55)
ANION GAP: 10 meq/L (ref 3–11)
AST: 46 U/L — AB (ref 5–34)
Albumin: 2.5 g/dL — ABNORMAL LOW (ref 3.5–5.0)
Alkaline Phosphatase: 122 U/L (ref 40–150)
BILIRUBIN TOTAL: 0.33 mg/dL (ref 0.20–1.20)
BUN: 25.3 mg/dL (ref 7.0–26.0)
CALCIUM: 9.9 mg/dL (ref 8.4–10.4)
CO2: 22 mEq/L (ref 22–29)
CREATININE: 1.2 mg/dL (ref 0.7–1.3)
Chloride: 105 mEq/L (ref 98–109)
EGFR: 72 mL/min/{1.73_m2} — ABNORMAL LOW (ref >90)
Glucose: 104 mg/dl (ref 70–140)
Potassium: 4.2 mEq/L (ref 3.5–5.1)
SODIUM: 137 meq/L (ref 136–145)
TOTAL PROTEIN: 7.4 g/dL (ref 6.4–8.3)

## 2015-01-24 LAB — TECHNOLOGIST REVIEW

## 2015-01-24 MED ORDER — TECHNETIUM TC 99M MEDRONATE IV KIT
23.5000 | PACK | Freq: Once | INTRAVENOUS | Status: AC | PRN
  Administered 2015-01-24: 23.5 via INTRAVENOUS

## 2015-01-24 MED ORDER — IOHEXOL 300 MG/ML  SOLN
50.0000 mL | Freq: Once | INTRAMUSCULAR | Status: DC | PRN
  Administered 2015-01-24: 50 mL via ORAL
  Filled 2015-01-24: qty 50

## 2015-01-24 MED ORDER — IOHEXOL 300 MG/ML  SOLN
100.0000 mL | Freq: Once | INTRAMUSCULAR | Status: AC | PRN
  Administered 2015-01-24: 100 mL via INTRAVENOUS

## 2015-01-25 LAB — PSA: PSA: 180.7 ng/mL — ABNORMAL HIGH (ref <=4.00)

## 2015-01-26 ENCOUNTER — Telehealth: Payer: Self-pay | Admitting: Oncology

## 2015-01-26 ENCOUNTER — Ambulatory Visit (HOSPITAL_BASED_OUTPATIENT_CLINIC_OR_DEPARTMENT_OTHER): Payer: Medicare Other | Admitting: Oncology

## 2015-01-26 ENCOUNTER — Ambulatory Visit (HOSPITAL_BASED_OUTPATIENT_CLINIC_OR_DEPARTMENT_OTHER): Payer: Medicare Other

## 2015-01-26 VITALS — BP 113/61 | HR 95 | Temp 98.4°F | Resp 18 | Wt 185.4 lb

## 2015-01-26 DIAGNOSIS — M549 Dorsalgia, unspecified: Secondary | ICD-10-CM

## 2015-01-26 DIAGNOSIS — D630 Anemia in neoplastic disease: Secondary | ICD-10-CM | POA: Diagnosis not present

## 2015-01-26 DIAGNOSIS — K5900 Constipation, unspecified: Secondary | ICD-10-CM

## 2015-01-26 DIAGNOSIS — C61 Malignant neoplasm of prostate: Secondary | ICD-10-CM | POA: Diagnosis not present

## 2015-01-26 DIAGNOSIS — C7951 Secondary malignant neoplasm of bone: Secondary | ICD-10-CM

## 2015-01-26 DIAGNOSIS — D638 Anemia in other chronic diseases classified elsewhere: Secondary | ICD-10-CM

## 2015-01-26 DIAGNOSIS — M79651 Pain in right thigh: Secondary | ICD-10-CM

## 2015-01-26 MED ORDER — OXYCODONE-ACETAMINOPHEN 5-325 MG PO TABS: 1.0000 | ORAL_TABLET | ORAL | Status: DC | PRN

## 2015-01-26 MED ORDER — MORPHINE SULFATE ER 30 MG PO TBCR: 30.0000 mg | EXTENDED_RELEASE_TABLET | Freq: Two times a day (BID) | ORAL | Status: DC

## 2015-01-26 MED ORDER — DENOSUMAB 120 MG/1.7ML ~~LOC~~ SOLN
120.0000 mg | Freq: Once | SUBCUTANEOUS | Status: AC
  Administered 2015-01-26: 120 mg via SUBCUTANEOUS
  Filled 2015-01-26: qty 1.7

## 2015-01-26 MED FILL — OXYCODONE/APAP 5/325MG: 5-325 | 5 days supply | Qty: 60 | Fill #0

## 2015-01-26 MED FILL — MORPHINE SULF 30 MG TAB SA: 30 | 30 days supply | Qty: 60 | Fill #0

## 2015-01-26 NOTE — Telephone Encounter (Addendum)
Gv pt appt  For 2/7 while here in the office advised will call back with transfusion appt.   Lvm advising appt 1/12 @ 10am and [facility]. Also called [phone] and s/w [place] pt's wife. Gave appt 1/12 @ 10am.

## 2015-01-26 NOTE — Progress Notes (Signed)
Hematology and Oncology Follow Up Visit  Marcus [name] [id number] 1945-03-31 70 y.o. 01/26/2015 1:08 PM   Principle Diagnosis: 70 year old gentleman diagnosed with prostate cancer in 2005. 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   Prior Therapy:  He is status post prostatectomy and subsequently was treated with salvage radiation therapy in 2007.  In February of 2013 his PSA was up to 22.5 and was started on androgen deprivation.  He developed castration resistant disease with documented bony metastasis despite combined androgen depravation with Lupron and Casodex. His last bone scan done on 09/18/2012 which showed progression of disease. Zytiga a total of 1000 mg started in August of 2014 for the PSA of 32.44. He was discontinued in November 2015 due to a rise in his PSA Xtandi 160 mg daily started in November 2015. Stopped on 05/13/2014 due to progression of disease.  Xofigo under the care of Dr. Tammi Klippel. First treatment given 06/09/2014. He is S/P 6 out of 6 completed on 11/17/2014.  Current therapy:  He is continued on Lupron at [facility] urology. His Xgeva to be given and the [place] since 06/2013.  Under evaluation for possible salvage therapy in the near future.  Interim History:  Marcus [name] presents today for a followup visit with his wife. Since his last visit, he reports increased in his lower extremity pain. His pain is now rather constant and have required a use of long-acting morphine. Although he does not take it every day but feels better when he does. He also have noted increased fatigue and tiredness. He has reported some occasional dyspnea on exertion. He has reported some decline in his health at this time. He continues to drive and attends to activities of daily living. Has not reported any chest pain or cough. Has not reported any falls or syncope.  Report any headaches, blurry vision, syncope or seizures. He does not report  any abdominal pain or distention. He does not report any hematochezia or bleeding per rectum. He does not report any nausea or vomiting. Did not report any urinary symptoms of hematuria dysuria or incontinence. He has not reported any chest pain or difficulty breathing. Has not reported any cough or hemoptysis or wheezing. Has not reported any major changes in his bowel habits. He did not report any skin changes or petechiae. Has not reported any lymphadenopathy. Remainder of his review of system is unremarkable.  Medications: I have reviewed the patient's current medications.  Current Outpatient Prescriptions  Medication Sig Dispense Refill  . Albuterol Sulfate (PROAIR RESPICLICK) [id number] (90 BASE) MCG/ACT AEPB Inhale 2 puffs into the lungs 4 (four) times daily as needed. 1 each 1  . cholecalciferol (VITAMIN D) 1000 UNITS tablet Take 1,000 Units by mouth daily.    Marland Kitchen lisinopril-hydrochlorothiazide (PRINZIDE,ZESTORETIC) 20-25 MG tablet TAKE ONE TABLET BY MOUTH ONCE DAILY 90 tablet 0  . megestrol (MEGACE) 40 MG/ML suspension Take 10 mLs (400 mg total) by mouth daily. 480 mL 0  . morphine (MS CONTIN) 30 MG 12 hr tablet Take 1 tablet (30 mg total) by mouth every 12 (twelve) hours. 60 tablet 0  . oxyCODONE-acetaminophen (PERCOCET/ROXICET) 5-325 MG tablet Take 1-2 tablets by mouth every 4 (four) hours as needed for severe pain (Do not take more than 12 tablets per 24 hours.). 60 tablet 0  . prochlorperazine (COMPAZINE) 10 MG tablet TAKE ONE TABLET BY MOUTH EVERY 6 HOURS AS NEEDED FOR NAUSEA AND  VOMITTING 30 tablet 1  . senna-docusate (SENNA  S) 8.6-50 MG per tablet Take 1 tablet by mouth at bedtime. 30 tablet 3  . XOFIGO 27 MCCI/ML SOLN Inject 50 KBq/kg into the vein every 30 (thirty) days. For 6 monthly infusions in nuclear medicine 6 each 0   No current facility-administered medications for this visit.   Facility-Administered Medications Ordered in Other Visits  Medication Dose Route Frequency Provider  Last Rate Last Dose  . denosumab (XGEVA) injection 120 mg  120 mg Subcutaneous Once Wyatt Portela, MD         Allergies: No Known Allergies   Physical Exam: Blood pressure 113/61, pulse 95, temperature 98.4 F (36.9 C), temperature source Oral, resp. rate 18, weight 185 lb 6.4 oz (84.097 kg), SpO2 99 %. ECOG: 1 General appearance: alert awake gentleman without distress today. Head: Normocephalic, without obvious abnormality oral ulcers or lesions. Neck: no adenopathy and No masses or thyromegaly. Lymph nodes: Cervical, supraclavicular, and axillary nodes normal. Heart:regular rate and rhythm, S1, S2. No murmurs rubs or gallops. Lung:chest clear, no wheezing, rales.  Abdomen: soft, non-tender, without masses or organomegaly no shifting dullness or ascites. EXT:no erythema, induration, no edema noted. Neurological: No deficits.    Lab Results: Lab Results  Component Value Date   WBC 7.3 01/24/2015   HGB 7.1* 01/24/2015   HCT 21.0* 01/24/2015   MCV 87.6 01/24/2015   PLT 308 01/24/2015     Chemistry      Component Value Date/Time   NA 137 01/24/2015 1017   NA 135 11/08/2014 0001   K 4.2 01/24/2015 1017   K 4.0 11/08/2014 0001   CL 101 11/08/2014 0001   CO2 22 01/24/2015 1017   CO2 26 11/08/2014 0001   BUN 25.3 01/24/2015 1017   BUN 16 11/08/2014 0001   CREATININE 1.2 01/24/2015 1017   CREATININE 1.09 11/08/2014 0001      Component Value Date/Time   CALCIUM 9.9 01/24/2015 1017   CALCIUM 9.1 11/08/2014 0001   ALKPHOS 122 01/24/2015 1017   ALKPHOS 75 11/08/2014 0001   AST 46* 01/24/2015 1017   AST 17 11/08/2014 0001   ALT 86* 01/24/2015 1017   ALT 16 11/08/2014 0001   BILITOT 0.33 01/24/2015 1017   BILITOT 0.6 11/08/2014 0001      Results for Marcus [name] (MRN [id number]) as of 01/26/2015 13:10  Ref. Range 11/10/2014 09:29 12/28/2014 12:45 01/24/2015 10:17  PSA Latest Ref Range: <=4.00 ng/mL 134.80 (H) 152.10 (H) 180.70 (H)     EXAM: NUCLEAR MEDICINE WHOLE  BODY BONE SCAN  TECHNIQUE: Whole body anterior and posterior images were obtained approximately 3 hours after intravenous injection of radiopharmaceutical.  RADIOPHARMACEUTICALS: 23.5 mCi Technetium-44m MDP IV  COMPARISON: 05/24/2014.  FINDINGS: Previously demonstrated multiple foci of increased tracer uptake in the thoracic spine are less intense than on the previous examination. There is also an overall decrease in degree of intensity of uptake in the innominate bones bilaterally. There is more confluent uptake in the sacral ala bilaterally. There are also multiple new foci of increased tracer uptake in both femurs. There are also new foci of increased uptake in both proximal humeri and both shoulders. Multiple foci of increased tracer uptake are again demonstrated in the sternum and at both sternoclavicular joints. A focus of increased tracer uptake in the right frontal bone is slightly more prominent. Increased tracer uptake in multiple bilateral ribs appears slightly less prominent. Normal renal and bladder activity is noted.  IMPRESSION: 1. There are multiple new metastases in both femurs, both  humeri and in both shoulders. 2. Mild increase in tracer activity within a right frontal bone probable metastasis. 3. Progressive metastatic disease in both sacral ala. 4. Mildly improved metastatic disease in the both innominate bones, thoracic spine and multiple ribs.      Impression and Plan:  70 year old gentleman with the following issues:  1. Castration resistant prostate cancer with metastatic disease to the bone. He has developed castration resistant disease and have progressed on both Zytiga and most recently on xtandi. His most recent PSA was up to 209.  He is status post 6 treatments with Xofigo under the care of Dr. Tammi Klippel with some temporary response in his PSA.  His last PSA had increased up to 180 and imaging studies obtained on 01/24/2015 were  reviewed today including a CT scan and a bone scan. He has clearly progressed on both imaging studies with disease predominantly into the bone. He does not have any visceral metastasis at this time.  Options of treatment were discussed today extensively with the patient and his family. I feel the only salvage therapy that has been proven to palliate symptoms as well as extend overall survival was Taxotere chemotherapy. Risks and benefits as well as logistics of administration of chemotherapy was discussed today in detail. Complications associated with this medication including nausea, vomiting, fatigue, myelosuppression, neutropenia, neutropenic sepsis, hair loss or infusion related complications among others. The benefit would be palliation of symptoms, improve in his pain scores, decrease his PSA and extending survival close to 3 months.  He is very hesitant to proceed with systemic chemotherapy and I told them alternatively supportive care only would be the other option. That will lead ultimately to disease progression and subsequently further deterioration and need for hospice. He understand this will happen eventually given with chemotherapy but chemotherapy can delay his cancer progression.  He will take the time to consider his options at this time and let me know in the future.  2. Androgen depravation: Lupron will be administered if his testosterone increases. His last testosterone was less than 19 done in September 2016.  3. Bone directed therapy: He is on Xgeva monthly which he as tolerated it well. Complications such as hypocalcemia and osteonecrosis of the jaw were reviewed and he is willing to proceed at this time. The risks and benefits of continuing this medication was also discussed today and he wants to continue.  4.Right thigh pain/back pain: His pain is poorly controlled at this time. I have advised him to restart long-acting morphine at 30 mg twice a day and use Percocet as needed  for breakthrough.  5. Constipation: MiraLax in the morning and continue Senokot S as well which have helped his symptoms. I encouraged him to restart this given his around-the-clock pain medication.  6. Anemia: Likely multifactorial in nature. He has anemia of chronic disease, malignancy also related to [facility]. I have offered him another transfusion at this time and we will set that up for him in the near future.   7. Diagnosis: This was discussed today in detail and he understand he has limited life expectancy and any treatment would be palliative at this time. He still has a reasonable performance status and it would be reasonable to consider aggressive therapy in the near future.  8. Followup: Will be in about 4 weeks and reevaluation at that time.  [facility] MD 1/5/20171:08 PM

## 2015-02-02 ENCOUNTER — Other Ambulatory Visit (HOSPITAL_BASED_OUTPATIENT_CLINIC_OR_DEPARTMENT_OTHER): Payer: Medicare Other

## 2015-02-02 ENCOUNTER — Ambulatory Visit (HOSPITAL_COMMUNITY)
Admission: RE | Admit: 2015-02-02 | Discharge: 2015-02-02 | Disposition: A | Discharge status: Home / Self Care | Payer: Medicare Other | Source: Ambulatory Visit | Attending: Oncology | Admitting: Oncology

## 2015-02-02 ENCOUNTER — Other Ambulatory Visit: Payer: Self-pay | Admitting: *Deleted

## 2015-02-02 VITALS — BP 98/62 | HR 70 | Temp 98.8°F | Resp 18

## 2015-02-02 DIAGNOSIS — C61 Malignant neoplasm of prostate: Secondary | ICD-10-CM

## 2015-02-02 DIAGNOSIS — D649 Anemia, unspecified: Secondary | ICD-10-CM

## 2015-02-02 DIAGNOSIS — C7951 Secondary malignant neoplasm of bone: Secondary | ICD-10-CM

## 2015-02-02 LAB — CBC WITH DIFFERENTIAL/PLATELET
BASO%: 0.2 % (ref 0.0–2.0)
BASOS ABS: 0 10*3/uL (ref 0.0–0.1)
EOS ABS: 0.1 10*3/uL (ref 0.0–0.5)
EOS%: 1.1 % (ref 0.0–7.0)
HCT: 20.5 % — ABNORMAL LOW (ref 38.4–49.9)
HEMOGLOBIN: 6.3 g/dL — AB (ref 13.0–17.1)
LYMPH#: 0.9 10*3/uL (ref 0.9–3.3)
LYMPH%: 10.8 % — ABNORMAL LOW (ref 14.0–49.0)
MCH: 27.4 pg (ref 27.2–33.4)
MCHC: 30.7 g/dL — ABNORMAL LOW (ref 32.0–36.0)
MCV: 89.1 fL (ref 79.3–98.0)
MONO#: 0.4 10*3/uL (ref 0.1–0.9)
MONO%: 4.2 % (ref 0.0–14.0)
NEUT#: 7.2 10*3/uL — ABNORMAL HIGH (ref 1.5–6.5)
NEUT%: 83.7 % — AB (ref 39.0–75.0)
PLATELETS: 239 10*3/uL (ref 140–400)
RBC: 2.3 10*6/uL — AB (ref 4.20–5.82)
RDW: 17.5 % — ABNORMAL HIGH (ref 11.0–14.6)
WBC: 8.5 10*3/uL (ref 4.0–10.3)
nRBC: 3 % — ABNORMAL HIGH (ref 0–0)

## 2015-02-02 LAB — PREPARE RBC (CROSSMATCH)

## 2015-02-02 LAB — TECHNOLOGIST REVIEW

## 2015-02-02 MED ORDER — DIPHENHYDRAMINE HCL 25 MG PO CAPS
25.0000 mg | ORAL_CAPSULE | Freq: Once | ORAL | Status: AC
  Administered 2015-02-02: 25 mg via ORAL
  Filled 2015-02-02: qty 1

## 2015-02-02 MED ORDER — HEPARIN SOD (PORK) LOCK FLUSH 100 UNIT/ML IV SOLN
500.0000 [IU] | Freq: Every day | INTRAVENOUS | Status: DC | PRN
Start: 2015-02-02 — End: 2015-02-03

## 2015-02-02 MED ORDER — SODIUM CHLORIDE 0.9 % IJ SOLN: 10.0000 mL | INTRAMUSCULAR | Status: DC | PRN

## 2015-02-02 MED ORDER — SODIUM CHLORIDE 0.9 % IV SOLN
250.0000 mL | Freq: Once | INTRAVENOUS | Status: AC
  Administered 2015-02-02: 250 mL via INTRAVENOUS

## 2015-02-02 MED ORDER — ACETAMINOPHEN 325 MG PO TABS
650.0000 mg | ORAL_TABLET | Freq: Once | ORAL | Status: AC
  Administered 2015-02-02: 650 mg via ORAL
  Filled 2015-02-02: qty 2

## 2015-02-02 MED ORDER — SODIUM CHLORIDE 0.9 % IJ SOLN: 3.0000 mL | INTRAMUSCULAR | Status: DC | PRN

## 2015-02-02 MED ORDER — HEPARIN SOD (PORK) LOCK FLUSH 100 UNIT/ML IV SOLN: 250.0000 [IU] | INTRAVENOUS | Status: DC | PRN

## 2015-02-02 NOTE — Progress Notes (Signed)
Diagnosis Association: Anemia, unspecified anemia type (285.9)          MD; Alen Blew, F.  Procedure; Pt. Received 2 units PRBC's.  Condition during procedure; Tolerated well.  Post Procedure; Pt. Alert, Oriented and ambulatory

## 2015-02-03 LAB — TYPE AND SCREEN
ABO/RH(D): O POS
Antibody Screen: NEGATIVE
Unit division: 0
Unit division: 0

## 2015-02-20 ENCOUNTER — Other Ambulatory Visit: Payer: Self-pay | Admitting: Oncology

## 2015-02-28 ENCOUNTER — Other Ambulatory Visit: Payer: Self-pay | Admitting: *Deleted

## 2015-02-28 ENCOUNTER — Telehealth: Payer: Self-pay | Admitting: Oncology

## 2015-02-28 ENCOUNTER — Other Ambulatory Visit (HOSPITAL_BASED_OUTPATIENT_CLINIC_OR_DEPARTMENT_OTHER): Payer: Medicare Other

## 2015-02-28 ENCOUNTER — Ambulatory Visit (HOSPITAL_BASED_OUTPATIENT_CLINIC_OR_DEPARTMENT_OTHER): Payer: Medicare Other | Admitting: Oncology

## 2015-02-28 ENCOUNTER — Ambulatory Visit (HOSPITAL_BASED_OUTPATIENT_CLINIC_OR_DEPARTMENT_OTHER): Payer: Medicare Other

## 2015-02-28 VITALS — BP 115/63 | HR 91 | Temp 98.9°F | Resp 18 | Wt 184.7 lb

## 2015-02-28 DIAGNOSIS — C7951 Secondary malignant neoplasm of bone: Secondary | ICD-10-CM

## 2015-02-28 DIAGNOSIS — D630 Anemia in neoplastic disease: Secondary | ICD-10-CM | POA: Diagnosis not present

## 2015-02-28 DIAGNOSIS — C61 Malignant neoplasm of prostate: Secondary | ICD-10-CM

## 2015-02-28 DIAGNOSIS — E291 Testicular hypofunction: Secondary | ICD-10-CM

## 2015-02-28 DIAGNOSIS — D638 Anemia in other chronic diseases classified elsewhere: Secondary | ICD-10-CM

## 2015-02-28 LAB — COMPREHENSIVE METABOLIC PANEL
ALBUMIN: 2.8 g/dL — AB (ref 3.5–5.0)
ALK PHOS: 125 U/L (ref 40–150)
ALT: 23 U/L (ref 0–55)
ANION GAP: 10 meq/L (ref 3–11)
AST: 16 U/L (ref 5–34)
BILIRUBIN TOTAL: 0.55 mg/dL (ref 0.20–1.20)
BUN: 17.9 mg/dL (ref 7.0–26.0)
CO2: 22 mEq/L (ref 22–29)
Calcium: 10.2 mg/dL (ref 8.4–10.4)
Chloride: 104 mEq/L (ref 98–109)
Creatinine: 1.2 mg/dL (ref 0.7–1.3)
EGFR: 74 mL/min/{1.73_m2} — AB (ref >90)
Glucose: 104 mg/dl (ref 70–140)
POTASSIUM: 4.3 meq/L (ref 3.5–5.1)
SODIUM: 135 meq/L — AB (ref 136–145)
TOTAL PROTEIN: 7.4 g/dL (ref 6.4–8.3)

## 2015-02-28 LAB — CBC WITH DIFFERENTIAL/PLATELET
BASO%: 0.5 % (ref 0.0–2.0)
BASOS ABS: 0.1 10*3/uL (ref 0.0–0.1)
EOS ABS: 0.1 10*3/uL (ref 0.0–0.5)
EOS%: 1.2 % (ref 0.0–7.0)
HCT: 26.1 % — ABNORMAL LOW (ref 38.4–49.9)
HEMOGLOBIN: 8.4 g/dL — AB (ref 13.0–17.1)
LYMPH#: 1 10*3/uL (ref 0.9–3.3)
LYMPH%: 10.4 % — ABNORMAL LOW (ref 14.0–49.0)
MCH: 28.6 pg (ref 27.2–33.4)
MCHC: 32.2 g/dL (ref 32.0–36.0)
MCV: 88.8 fL (ref 79.3–98.0)
MONO#: 1 10*3/uL — ABNORMAL HIGH (ref 0.1–0.9)
MONO%: 10.8 % (ref 0.0–14.0)
NEUT#: 7.1 10*3/uL — ABNORMAL HIGH (ref 1.5–6.5)
NEUT%: 77.1 % — AB (ref 39.0–75.0)
NRBC: 1 % — AB (ref 0–0)
PLATELETS: 211 10*3/uL (ref 140–400)
RBC: 2.94 10*6/uL — ABNORMAL LOW (ref 4.20–5.82)
RDW: 17.8 % — AB (ref 11.0–14.6)
WBC: 9.2 10*3/uL (ref 4.0–10.3)

## 2015-02-28 LAB — TECHNOLOGIST REVIEW

## 2015-02-28 MED ORDER — MORPHINE SULFATE ER 30 MG PO TBCR: 30.0000 mg | EXTENDED_RELEASE_TABLET | Freq: Two times a day (BID) | ORAL | Status: DC

## 2015-02-28 MED ORDER — PROCHLORPERAZINE MALEATE 10 MG PO TABS: ORAL_TABLET | ORAL | Status: DC

## 2015-02-28 MED ORDER — DENOSUMAB 120 MG/1.7ML ~~LOC~~ SOLN
120.0000 mg | Freq: Once | SUBCUTANEOUS | Status: AC
Start: 2015-02-28 — End: 2015-02-28
  Administered 2015-02-28: 120 mg via SUBCUTANEOUS
  Filled 2015-02-28: qty 1.7

## 2015-02-28 MED ORDER — OXYCODONE-ACETAMINOPHEN 5-325 MG PO TABS: 1.0000 | ORAL_TABLET | ORAL | Status: DC | PRN

## 2015-02-28 MED ORDER — MEGESTROL ACETATE 40 MG/ML PO SUSP: ORAL | Status: AC

## 2015-02-28 NOTE — Telephone Encounter (Signed)
Talked with and scheduled this patient’s appointment(s) while patient was here in our office.       [facility]. °

## 2015-02-28 NOTE — Telephone Encounter (Signed)
Pt's wife called to verify today's apt, confirmed time with her.... KJ

## 2015-02-28 NOTE — Progress Notes (Signed)
Hematology and Oncology Follow Up Visit  Marcus [name] [id number] 07/01/1945 70 y.o. 02/28/2015 3:30 PM   Principle Diagnosis: 70 year old gentleman diagnosed with prostate cancer in 2005. 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   Prior Therapy:  He is status post prostatectomy and subsequently was treated with salvage radiation therapy in 2007.  In February of 2013 his PSA was up to 22.5 and was started on androgen deprivation.  He developed castration resistant disease with documented bony metastasis despite combined androgen depravation with Lupron and Casodex. His last bone scan done on 09/18/2012 which showed progression of disease. Zytiga a total of 1000 mg started in August of 2014 for the PSA of 32.44. He was discontinued in November 2015 due to a rise in his PSA Xtandi 160 mg daily started in November 2015. Stopped on 05/13/2014 due to progression of disease.  Xofigo under the care of Dr. Tammi Klippel. First treatment given 06/09/2014. He is S/P 6 out of 6 completed on 11/17/2014.  Current therapy:  He is continued on Lupron at [facility] urology. His Xgeva to be given and the [facility] since 06/2013.  He have refused systemic chemotherapy and will continue with supportive care only.  Interim History:  Marcus [name] presents today for a followup visit with his wife. Since his last visit, he reports doing reasonably well at this time. His pain have been fairly controlled although she does not take breakthrough pain medication as it often as needs to. He is ambulating without any difficulties. Has not reported any falls or syncope. His appetite have been reasonable and is able to maintain his weight for the time being. He does use Megace which have boost his appetite.  He did receive packed red cell transfusions after the last visit and have not felt any different in terms of energy. He does deny shortness of breath or dyspnea on exertion. He  continues to drive and attends to activities of daily living. Has not reported any chest pain or cough. Has not reported any falls or syncope.  Report any headaches, blurry vision, syncope or seizures. He does not report any abdominal pain or distention. He does not report any hematochezia or bleeding per rectum. He does not report any nausea or vomiting. Did not report any urinary symptoms of hematuria dysuria or incontinence. He has not reported any chest pain or difficulty breathing. Has not reported any cough or hemoptysis or wheezing. Has not reported any major changes in his bowel habits. He did not report any skin changes or petechiae. Has not reported any lymphadenopathy. Remainder of his review of system is unremarkable.  Medications: I have reviewed the patient's current medications.  Current Outpatient Prescriptions  Medication Sig Dispense Refill  . Albuterol Sulfate (PROAIR RESPICLICK) 123XX123 (90 BASE) MCG/ACT AEPB Inhale 2 puffs into the lungs 4 (four) times daily as needed. 1 each 1  . cholecalciferol (VITAMIN D) 1000 UNITS tablet Take 1,000 Units by mouth daily.    Marland Kitchen lisinopril-hydrochlorothiazide (PRINZIDE,ZESTORETIC) 20-25 MG tablet TAKE ONE TABLET BY MOUTH ONCE DAILY 90 tablet 0  . megestrol (MEGACE) 40 MG/ML suspension TAKE TEN ML BY MOUTH ONCE DAILY 480 mL 0  . prochlorperazine (COMPAZINE) 10 MG tablet TAKE ONE TABLET BY MOUTH EVERY 6 HOURS AS NEEDED FOR NAUSEA AND  VOMITTING 30 tablet 0  . senna-docusate (SENNA S) 8.6-50 MG per tablet Take 1 tablet by mouth at bedtime. 30 tablet 3  . XOFIGO 27 MCCI/ML SOLN Inject 50 KBq/kg  into the vein every 30 (thirty) days. For 6 monthly infusions in nuclear medicine 6 each 0  . morphine (MS CONTIN) 30 MG 12 hr tablet Take 1 tablet (30 mg total) by mouth every 12 (twelve) hours. 60 tablet 0  . oxyCODONE-acetaminophen (PERCOCET/ROXICET) 5-325 MG tablet Take 1-2 tablets by mouth every 4 (four) hours as needed for severe pain (Do not take more than  12 tablets per 24 hours.). 60 tablet 0   No current facility-administered medications for this visit.     Allergies: No Known Allergies   Physical Exam: Blood pressure 115/63, pulse 91, temperature 98.9 F (37.2 C), temperature source Oral, resp. rate 18, weight 184 lb 11.2 oz (83.779 kg), SpO2 97 %. ECOG: 1 General appearance: alert awake without distress.  Head: Normocephalic, without obvious abnormality no oral ulcers or lesions. Neck: no adenopathy and No masses or thyromegaly. Lymph nodes: Cervical, supraclavicular, and axillary nodes normal. Heart:regular rate and rhythm, S1, S2. No murmurs rubs or gallops. Lung:chest clear, no wheezing, rales.  Abdomen: soft, non-tender, without masses or organomegaly no assays.  EXT:no erythema, induration, no edema noted. Neurological: No deficits.    Lab Results: Lab Results  Component Value Date   WBC 9.2 02/28/2015   HGB 8.4* 02/28/2015   HCT 26.1* 02/28/2015   MCV 88.8 02/28/2015   PLT 211 02/28/2015     Chemistry      Component Value Date/Time   NA 137 01/24/2015 1017   NA 135 11/08/2014 0001   K 4.2 01/24/2015 1017   K 4.0 11/08/2014 0001   CL 101 11/08/2014 0001   CO2 22 01/24/2015 1017   CO2 26 11/08/2014 0001   BUN 25.3 01/24/2015 1017   BUN 16 11/08/2014 0001   CREATININE 1.2 01/24/2015 1017   CREATININE 1.09 11/08/2014 0001      Component Value Date/Time   CALCIUM 9.9 01/24/2015 1017   CALCIUM 9.1 11/08/2014 0001   ALKPHOS 122 01/24/2015 1017   ALKPHOS 75 11/08/2014 0001   AST 46* 01/24/2015 1017   AST 17 11/08/2014 0001   ALT 86* 01/24/2015 1017   ALT 16 11/08/2014 0001   BILITOT 0.33 01/24/2015 1017   BILITOT 0.6 11/08/2014 0001      Results for Marcus [name], Marcus [name] (MRN [id number]) as of 01/26/2015 13:10  Ref. Range 11/10/2014 09:29 12/28/2014 12:45 01/24/2015 10:17  PSA Latest Ref Range: <=4.00 ng/mL 134.80 (H) 152.10 (H) 180.70 (H)     Impression and Plan:  70 year old gentleman with the following  issues:  1. Castration resistant prostate cancer with metastatic disease to the bone. He has developed castration resistant disease and have progressed on both Zytiga and most recently on xtandi. His most recent PSA was up to 209.  He is status post 6 treatments with Xofigo under the care of Dr. Tammi Klippel with some temporary response in his PSA.  His last PSA had increased up to 180 and imaging studies obtained on 01/24/2015 including a CT scan and a bone scan for progression of disease and to the bone.   Options of treatment were discussed again today including systemic chemotherapy versus supportive care only and he have elected to not receive any systemic chemotherapy for the time being. He might consider changing his mind in the future but for the time being elected to hold off on any treatment and continue supportive care. It is reasonable to do so and certainly reconsider that option in the future.  2. Androgen depravation: Lupron will be administered if his  testosterone increases. His last testosterone was less than 19 done in September 2016.  3. Bone directed therapy: He is on Xgeva monthly which he as tolerated it well. Complications such as hypocalcemia and osteonecrosis of the jaw were reviewed and he is willing to proceed at this time.   4.Right thigh pain/back pain: His pain is better at this time with long-acting morphine and breakthrough Percocet. I continue to encourage him  to take his medication as prescribed.   5. Constipation: MiraLax in the morning and continue Senokot S as well which have helped his symptoms.  6. Anemia:  He has anemia of chronic disease, malignancy also related to [facility]. hemoglobin is improved after transfusions last month. We will consider future transfusion in the future.  7. prognosis: This was discussed again today. He understand he has limited life expectancy and any treatment would be palliative at this time. He still has a reasonable performance  status and it would be reasonable to consider aggressive therapy  such as chemotherapy if he elects to do so in the future.   8. Nutrition and diet: This was discussed today and different strategies to boost his nutritional status were reviewed. He is doing reasonable job of maintaining his weight and taking nutritional supplements with his meals.  9. Followup: Will be in about 4 weeks and reevaluation at that time.  [facility] MD 2/7/20173:30 PM

## 2015-03-01 ENCOUNTER — Telehealth: Payer: Self-pay

## 2015-03-01 ENCOUNTER — Telehealth: Payer: Self-pay | Admitting: *Deleted

## 2015-03-01 LAB — PSA: Prostate Specific Ag, Serum: 196.8 ng/mL — ABNORMAL HIGH (ref 0.0–4.0)

## 2015-03-01 LAB — PSA (PARALLEL TESTING): PSA: 188.3 ng/mL — ABNORMAL HIGH (ref <=4.00)

## 2015-03-01 NOTE — Telephone Encounter (Signed)
-----   Message from Wyatt Portela, MD sent at 03/01/2015  8:48 AM EST ----- PSA slightly up. Please let him know and no changes recommended at this time.

## 2015-03-01 NOTE — Telephone Encounter (Signed)
The fax number for clerk of court so pt can get out of jury duty is [phone]. Please send a letter explaining pt is unable to sit for jury duty.  There is a number on the letter [phone].

## 2015-03-01 NOTE — Telephone Encounter (Signed)
As noted below by Dr. Shadad, I informed patient of his PSA level. Patient verbalized understanding. 

## 2015-03-02 ENCOUNTER — Encounter: Payer: Self-pay | Admitting: *Deleted

## 2015-03-02 ENCOUNTER — Other Ambulatory Visit: Payer: Self-pay | Admitting: *Deleted

## 2015-03-02 ENCOUNTER — Telehealth: Payer: Self-pay | Admitting: Family Medicine

## 2015-03-02 MED ORDER — LISINOPRIL-HYDROCHLOROTHIAZIDE 20-25 MG PO TABS: 1.0000 | ORAL_TABLET | Freq: Every day | ORAL | Status: DC

## 2015-03-02 NOTE — Telephone Encounter (Signed)
Refill sent to [facility] 2/9

## 2015-03-02 NOTE — Telephone Encounter (Signed)
Rcvd refill request for Lisinopril to be sent to [facility]. Fax was sent to Dr Hazeline Junker office in error and they forwarded to our office

## 2015-03-03 ENCOUNTER — Encounter: Payer: Self-pay | Admitting: Oncology

## 2015-03-03 NOTE — Progress Notes (Signed)
I sent form and notes to [facility] for auth for megestrol.  [phone]

## 2015-03-06 ENCOUNTER — Encounter: Payer: Self-pay | Admitting: *Deleted

## 2015-03-08 ENCOUNTER — Encounter: Payer: Self-pay | Admitting: *Deleted

## 2015-03-08 ENCOUNTER — Encounter: Payer: Self-pay | Admitting: Oncology

## 2015-03-08 NOTE — Progress Notes (Signed)
I sent  req for megetrol prior again to [facility] with notes/labs

## 2015-03-08 NOTE — Progress Notes (Signed)
Requested letter to be excused from jury duty on dr shadad's desk, to be signed. Wife [place] notified.

## 2015-03-10 ENCOUNTER — Encounter: Payer: Self-pay | Admitting: Oncology

## 2015-03-10 NOTE — Progress Notes (Signed)
Per Juliann Pulse with [facility] left message medgetrol as approved and we will get letter in mail

## 2015-03-13 ENCOUNTER — Telehealth: Payer: Self-pay | Admitting: *Deleted

## 2015-03-13 NOTE — Progress Notes (Signed)
[phone] voicemail. Triage retrieved at 0900 from spouse [place].  "Calling to ensure paper gets faxed to clerk of court about [place]."  Return number [phone].

## 2015-03-13 NOTE — Telephone Encounter (Signed)
No new note. 

## 2015-03-13 NOTE — Telephone Encounter (Signed)
"  I'm calling about juror form for Marcus [name] and the mail order pharmacy faxed an inquiry last Thursday for his pain medicine.  He is almost out of Morphine and Percocet.  They have a question that needs to be answered."  Call transferred to collaborative.  Voicemail received.

## 2015-03-22 ENCOUNTER — Encounter: Payer: Self-pay | Admitting: Oncology

## 2015-03-22 NOTE — Progress Notes (Signed)
Per [facility]--[facility] megestrol approved [phone] 02/23/15-01/21/16. I sent to medical records

## 2015-03-29 ENCOUNTER — Ambulatory Visit (HOSPITAL_BASED_OUTPATIENT_CLINIC_OR_DEPARTMENT_OTHER): Payer: Medicare Other

## 2015-03-29 ENCOUNTER — Other Ambulatory Visit (HOSPITAL_BASED_OUTPATIENT_CLINIC_OR_DEPARTMENT_OTHER): Payer: Medicare Other

## 2015-03-29 ENCOUNTER — Telehealth: Payer: Self-pay | Admitting: Oncology

## 2015-03-29 ENCOUNTER — Ambulatory Visit (HOSPITAL_BASED_OUTPATIENT_CLINIC_OR_DEPARTMENT_OTHER): Payer: Medicare Other | Admitting: Oncology

## 2015-03-29 VITALS — BP 120/70 | HR 72 | Temp 98.4°F | Resp 17 | Ht 74.0 in | Wt 180.4 lb

## 2015-03-29 DIAGNOSIS — C61 Malignant neoplasm of prostate: Secondary | ICD-10-CM | POA: Diagnosis not present

## 2015-03-29 DIAGNOSIS — C7951 Secondary malignant neoplasm of bone: Secondary | ICD-10-CM

## 2015-03-29 DIAGNOSIS — E291 Testicular hypofunction: Secondary | ICD-10-CM | POA: Diagnosis not present

## 2015-03-29 DIAGNOSIS — D630 Anemia in neoplastic disease: Secondary | ICD-10-CM | POA: Diagnosis not present

## 2015-03-29 DIAGNOSIS — M79651 Pain in right thigh: Secondary | ICD-10-CM

## 2015-03-29 DIAGNOSIS — K5900 Constipation, unspecified: Secondary | ICD-10-CM

## 2015-03-29 DIAGNOSIS — M549 Dorsalgia, unspecified: Secondary | ICD-10-CM

## 2015-03-29 LAB — CBC WITH DIFFERENTIAL/PLATELET
BASO%: 1.2 % (ref 0.0–2.0)
BASOS ABS: 0.1 10*3/uL (ref 0.0–0.1)
EOS ABS: 0.2 10*3/uL (ref 0.0–0.5)
EOS%: 1.4 % (ref 0.0–7.0)
HEMATOCRIT: 23.2 % — AB (ref 38.4–49.9)
HEMOGLOBIN: 7.3 g/dL — AB (ref 13.0–17.1)
LYMPH#: 1.4 10*3/uL (ref 0.9–3.3)
LYMPH%: 13.6 % — ABNORMAL LOW (ref 14.0–49.0)
MCH: 28.9 pg (ref 27.2–33.4)
MCHC: 31.5 g/dL — ABNORMAL LOW (ref 32.0–36.0)
MCV: 91.7 fL (ref 79.3–98.0)
MONO#: 0.6 10*3/uL (ref 0.1–0.9)
MONO%: 5.8 % (ref 0.0–14.0)
NEUT#: 8.1 10*3/uL — ABNORMAL HIGH (ref 1.5–6.5)
NEUT%: 78 % — AB (ref 39.0–75.0)
NRBC: 7 % — AB (ref 0–0)
PLATELETS: 234 10*3/uL (ref 140–400)
RBC: 2.53 10*6/uL — ABNORMAL LOW (ref 4.20–5.82)
RDW: 19 % — AB (ref 11.0–14.6)
WBC: 10.4 10*3/uL — ABNORMAL HIGH (ref 4.0–10.3)

## 2015-03-29 LAB — COMPREHENSIVE METABOLIC PANEL
ALBUMIN: 2.7 g/dL — AB (ref 3.5–5.0)
ALK PHOS: 118 U/L (ref 40–150)
ALT: 46 U/L (ref 0–55)
ANION GAP: 9 meq/L (ref 3–11)
AST: 23 U/L (ref 5–34)
BUN: 19.9 mg/dL (ref 7.0–26.0)
CALCIUM: 10.9 mg/dL — AB (ref 8.4–10.4)
CO2: 21 mEq/L — ABNORMAL LOW (ref 22–29)
Chloride: 109 mEq/L (ref 98–109)
Creatinine: 1 mg/dL (ref 0.7–1.3)
EGFR: 84 mL/min/{1.73_m2} — AB (ref >90)
GLUCOSE: 99 mg/dL (ref 70–140)
POTASSIUM: 4.5 meq/L (ref 3.5–5.1)
SODIUM: 140 meq/L (ref 136–145)
TOTAL PROTEIN: 7.2 g/dL (ref 6.4–8.3)

## 2015-03-29 LAB — TECHNOLOGIST REVIEW

## 2015-03-29 MED ORDER — DENOSUMAB 120 MG/1.7ML ~~LOC~~ SOLN
120.0000 mg | Freq: Once | SUBCUTANEOUS | Status: AC
  Administered 2015-03-29: 120 mg via SUBCUTANEOUS
  Filled 2015-03-29: qty 1.7

## 2015-03-29 NOTE — Telephone Encounter (Signed)
Gave and printed appt sched and avs for pt for March and April °

## 2015-03-29 NOTE — Progress Notes (Signed)
Hematology and Oncology Follow Up Visit  Marcus [name] [id number] 1945-12-05 70 y.o. 03/29/2015 10:24 AM   Principle Diagnosis: 70 year old gentleman diagnosed with prostate cancer in 2005. 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   Prior Therapy:  He is status post prostatectomy and subsequently was treated with salvage radiation therapy in 2007.  In February of 2013 his PSA was up to 22.5 and was started on androgen deprivation.  He developed castration resistant disease with documented bony metastasis despite combined androgen depravation with Lupron and Casodex. His last bone scan done on 09/18/2012 which showed progression of disease. Zytiga a total of 1000 mg started in August of 2014 for the PSA of 32.44. He was discontinued in November 2015 due to a rise in his PSA Xtandi 160 mg daily started in November 2015. Stopped on 05/13/2014 due to progression of disease.  Xofigo under the care of Dr. Tammi Klippel. First treatment given 06/09/2014. He is S/P 6 out of 6 completed on 11/17/2014.  Current therapy:  He is continued on Lupron at [facility] urology. His Xgeva to be given and the [facility] since 06/2013.  He have refused systemic chemotherapy and will continue with supportive care only.  Interim History:  Marcus [name] presents today for a followup visit with his wife. Since his last visit, he reports no major changes in his health. His performance status is marginal but have not dramatically changed. He did receive 2 units of packed red cells which did not help him as much but his family does note improvement in his quality of life.  His pain have been fairly controlled with long-acting pain medication and infrequent use of breakthrough. He reports needing breakthrough pain medication about once or twice a day. He is ambulating without any difficulties. Has not reported any falls or syncope. His appetite have been reasonable but his weight is down 3  pounds.  He denies any chest pain but does report dyspnea on exertion. He does report quite bit of fatigue and he is spending more time in a chair inside of his house. He drives short distances but is driving have also diminished.  He report any headaches, blurry vision, syncope or seizures. He does not report any abdominal pain or distention. He does not report any hematochezia or bleeding per rectum. He does not report any nausea or vomiting. Did not report any urinary symptoms of hematuria dysuria or incontinence. He has not reported any chest pain or difficulty breathing. Has not reported any cough or hemoptysis or wheezing. Has not reported any major changes in his bowel habits. He did not report any skin changes or petechiae. Has not reported any lymphadenopathy. Remainder of his review of system is unremarkable.  Medications: I have reviewed the patient's current medications.  Current Outpatient Prescriptions  Medication Sig Dispense Refill  . Albuterol Sulfate (PROAIR RESPICLICK) 123XX123 (90 BASE) MCG/ACT AEPB Inhale 2 puffs into the lungs 4 (four) times daily as needed. 1 each 1  . cholecalciferol (VITAMIN D) 1000 UNITS tablet Take 1,000 Units by mouth daily.    Marland Kitchen lisinopril-hydrochlorothiazide (PRINZIDE,ZESTORETIC) 20-25 MG tablet Take 1 tablet by mouth daily. 90 tablet 0  . megestrol (MEGACE) 40 MG/ML suspension TAKE TEN ML BY MOUTH ONCE DAILY 480 mL 2  . morphine (MS CONTIN) 30 MG 12 hr tablet Take 1 tablet (30 mg total) by mouth every 12 (twelve) hours. 60 tablet 0  . oxyCODONE-acetaminophen (PERCOCET/ROXICET) 5-325 MG tablet Take 1-2 tablets by mouth  every 4 (four) hours as needed for severe pain (Do not take more than 12 tablets per 24 hours.). 60 tablet 0  . prochlorperazine (COMPAZINE) 10 MG tablet TAKE ONE TABLET BY MOUTH EVERY 6 HOURS AS NEEDED FOR NAUSEA AND  VOMITTING 60 tablet 1  . senna-docusate (SENNA S) 8.6-50 MG per tablet Take 1 tablet by mouth at bedtime. 30 tablet 3  .  XOFIGO 27 MCCI/ML SOLN Inject 50 KBq/kg into the vein every 30 (thirty) days. For 6 monthly infusions in nuclear medicine 6 each 0   No current facility-administered medications for this visit.     Allergies: No Known Allergies   Physical Exam: Blood pressure 120/70, pulse 72, temperature 98.4 F (36.9 C), temperature source Oral, resp. rate 17, height 6\' 2"  (1.88 m), weight 180 lb 6.4 oz (81.829 kg), SpO2 100 %. ECOG: 1 General appearance: alert pleasant gentleman without distress. Head: Normocephalic, without obvious abnormality no oral thrush noted. Neck: no adenopathy and No masses or thyromegaly. Lymph nodes: Cervical, supraclavicular, and axillary nodes normal. Heart:regular rate and rhythm, S1, S2. No murmurs rubs or gallops. Lung:chest clear, no wheezing, rales.  Abdomen: soft, non-tender, without masses or organomegaly no shifting dullness or ascites. EXT:no erythema, induration, no edema noted. Neurological: No deficits.    Lab Results: Lab Results  Component Value Date   WBC 10.4* 03/29/2015   HGB 7.3* 03/29/2015   HCT 23.2* 03/29/2015   MCV 91.7 03/29/2015   PLT 234 03/29/2015     Chemistry      Component Value Date/Time   NA 135* 02/28/2015 1449   NA 135 11/08/2014 0001   K 4.3 02/28/2015 1449   K 4.0 11/08/2014 0001   CL 101 11/08/2014 0001   CO2 22 02/28/2015 1449   CO2 26 11/08/2014 0001   BUN 17.9 02/28/2015 1449   BUN 16 11/08/2014 0001   CREATININE 1.2 02/28/2015 1449   CREATININE 1.09 11/08/2014 0001      Component Value Date/Time   CALCIUM 10.2 02/28/2015 1449   CALCIUM 9.1 11/08/2014 0001   ALKPHOS 125 02/28/2015 1449   ALKPHOS 75 11/08/2014 0001   AST 16 02/28/2015 1449   AST 17 11/08/2014 0001   ALT 23 02/28/2015 1449   ALT 16 11/08/2014 0001   BILITOT 0.55 02/28/2015 1449   BILITOT 0.6 11/08/2014 0001      Results for KEYNER, FINNIN (MRN [id number]) as of 03/29/2015 09:58  Ref. Range 02/28/2015 14:49 02/28/2015 14:49  PSA Latest Ref  Range: 0.0-4.0 ng/mL 188.30 (H) 196.8 (H)      Impression and Plan:  70 year old gentleman with the following issues:  1. Castration resistant prostate cancer with metastatic disease to the bone. He has developed castration resistant disease and have progressed on both Zytiga and most recently on xtandi. His most recent PSA was up to 209.  He is status post 6 treatments with Xofigo under the care of Dr. Tammi Klippel with some temporary response in his PSA.  His last PSA continues to increase and most recently up to 196.8 and clear progression of disease on imaging studies.  Options of treatment were reviewed again with the patient and his family and he continues to decline chemotherapy. He is comfortable with supportive care only and address symptoms as they arise.  2. Androgen depravation: Lupron will be administered if his testosterone increases. His last testosterone was less than 19 done in September 2016. This will be repeated in the future as needed.  3. Bone directed therapy: He  is on Xgeva monthly which he as tolerated it well. Complications of this therapy as well as the benefit were reviewed and he wants to continue. These complications include hypocalcemia and osteonecrosis of the jaw.  4.Right thigh pain/back pain: His pain is better at this time with long-acting morphine and breakthrough Percocet. These will be continued without change and will be filled as needed.  5. Constipation: MiraLax in the morning and continue Senokot S as well which have helped his symptoms. Bowel habits have been regular.  6. Anemia:  He has anemia of chronic disease, malignancy also related to [facility] Hemoglobin has drifted down at this time and would benefit from transfusion. We will electively scheduled next week to receive 2 units of packed red cells.  7. prognosis: He has an incurable malignancy and likely limited life expectancy. If he develops deterioration and is a very possible, hospice needs to  be involved. This was discussed with the patient on few occasions.  8. Nutrition and diet: This was discussed today and different strategies to boost his nutritional status were reviewed. He is to increase nutritional supplements to 3 times a day.  9. Followup: Will be in about 4 weeks and reevaluation at that time.  [facility] MD 3/8/201710:24 AM

## 2015-03-30 LAB — PSA (PARALLEL TESTING): PSA: 204.9 ng/mL — ABNORMAL HIGH (ref <=4.00)

## 2015-03-30 LAB — PSA: Prostate Specific Ag, Serum: 189.8 ng/mL — ABNORMAL HIGH (ref 0.0–4.0)

## 2015-04-03 ENCOUNTER — Other Ambulatory Visit: Payer: Self-pay | Admitting: *Deleted

## 2015-04-03 ENCOUNTER — Encounter: Payer: Self-pay | Admitting: *Deleted

## 2015-04-03 DIAGNOSIS — C61 Malignant neoplasm of prostate: Secondary | ICD-10-CM

## 2015-04-04 ENCOUNTER — Ambulatory Visit (HOSPITAL_COMMUNITY)
Admission: RE | Admit: 2015-04-04 | Discharge: 2015-04-04 | Disposition: A | Discharge status: Home / Self Care | Payer: Medicare Other | Source: Ambulatory Visit | Attending: Oncology | Admitting: Oncology

## 2015-04-04 ENCOUNTER — Ambulatory Visit (HOSPITAL_BASED_OUTPATIENT_CLINIC_OR_DEPARTMENT_OTHER): Payer: Medicare Other

## 2015-04-04 ENCOUNTER — Other Ambulatory Visit (HOSPITAL_BASED_OUTPATIENT_CLINIC_OR_DEPARTMENT_OTHER): Payer: Medicare Other

## 2015-04-04 ENCOUNTER — Other Ambulatory Visit: Payer: Self-pay | Admitting: *Deleted

## 2015-04-04 VITALS — BP 113/71 | HR 81 | Temp 98.0°F | Resp 18

## 2015-04-04 DIAGNOSIS — C61 Malignant neoplasm of prostate: Secondary | ICD-10-CM

## 2015-04-04 DIAGNOSIS — D649 Anemia, unspecified: Secondary | ICD-10-CM

## 2015-04-04 DIAGNOSIS — D630 Anemia in neoplastic disease: Secondary | ICD-10-CM

## 2015-04-04 LAB — CBC WITH DIFFERENTIAL/PLATELET
BASO%: 0.8 % (ref 0.0–2.0)
BASOS ABS: 0.1 10*3/uL (ref 0.0–0.1)
EOS ABS: 0.1 10*3/uL (ref 0.0–0.5)
EOS%: 1.1 % (ref 0.0–7.0)
HEMATOCRIT: 23.7 % — AB (ref 38.4–49.9)
HEMOGLOBIN: 7.5 g/dL — AB (ref 13.0–17.1)
LYMPH#: 1.1 10*3/uL (ref 0.9–3.3)
LYMPH%: 10.9 % — ABNORMAL LOW (ref 14.0–49.0)
MCH: 29.3 pg (ref 27.2–33.4)
MCHC: 31.6 g/dL — ABNORMAL LOW (ref 32.0–36.0)
MCV: 92.6 fL (ref 79.3–98.0)
MONO#: 0.8 10*3/uL (ref 0.1–0.9)
MONO%: 8.6 % (ref 0.0–14.0)
NEUT#: 7.7 10*3/uL — ABNORMAL HIGH (ref 1.5–6.5)
NEUT%: 78.6 % — ABNORMAL HIGH (ref 39.0–75.0)
NRBC: 7 % — AB (ref 0–0)
PLATELETS: 228 10*3/uL (ref 140–400)
RBC: 2.56 10*6/uL — ABNORMAL LOW (ref 4.20–5.82)
RDW: 19.5 % — AB (ref 11.0–14.6)
WBC: 9.8 10*3/uL (ref 4.0–10.3)

## 2015-04-04 LAB — PREPARE RBC (CROSSMATCH)

## 2015-04-04 LAB — TECHNOLOGIST REVIEW

## 2015-04-04 MED ORDER — HEPARIN SOD (PORK) LOCK FLUSH 100 UNIT/ML IV SOLN
500.0000 [IU] | Freq: Every day | INTRAVENOUS | Status: DC | PRN
  Filled 2015-04-04: qty 5

## 2015-04-04 MED ORDER — DIPHENHYDRAMINE HCL 25 MG PO CAPS
ORAL_CAPSULE | ORAL | Status: AC
  Filled 2015-04-04: qty 1

## 2015-04-04 MED ORDER — ACETAMINOPHEN 325 MG PO TABS
650.0000 mg | ORAL_TABLET | Freq: Once | ORAL | Status: AC
  Administered 2015-04-04: 650 mg via ORAL

## 2015-04-04 MED ORDER — SODIUM CHLORIDE 0.9 % IV SOLN
250.0000 mL | Freq: Once | INTRAVENOUS | Status: AC
  Administered 2015-04-04: 250 mL via INTRAVENOUS

## 2015-04-04 MED ORDER — SODIUM CHLORIDE 0.9% FLUSH
10.0000 mL | INTRAVENOUS | Status: DC | PRN
Start: 2015-04-04 — End: 2015-04-04
  Filled 2015-04-04: qty 10

## 2015-04-04 MED ORDER — ACETAMINOPHEN 325 MG PO TABS
ORAL_TABLET | ORAL | Status: AC
  Filled 2015-04-04: qty 2

## 2015-04-04 MED ORDER — DIPHENHYDRAMINE HCL 25 MG PO CAPS
25.0000 mg | ORAL_CAPSULE | Freq: Once | ORAL | Status: AC
  Administered 2015-04-04: 25 mg via ORAL

## 2015-04-04 NOTE — Patient Instructions (Signed)
 Blood Transfusion  A blood transfusion is a procedure in which you receive donated blood through an IV tube. You may need a blood transfusion because of illness, surgery, or injury. The blood may come from a donor, or it may be your own blood that you donated previously. The blood given in a transfusion is made up of different types of cells. You may receive:  Red blood cells. These carry oxygen and replace lost blood.  Platelets. These control bleeding.  Plasma. Thishelps blood to clot. If you have hemophilia or another clotting disorder, you may also receive other types of blood products. LET YOUR HEALTH CARE PROVIDER KNOW ABOUT:  Any allergies you have.  All medicines you are taking, including vitamins, herbs, eye drops, creams, and over-the-counter medicines.  Previous problems you or members of your family have had with the use of anesthetics.  Any blood disorders you have.  Previous surgeries you have had.  Any medical conditions you may have.  Any previous reactions you have had during a blood transfusion.  RISKS AND COMPLICATIONS Generally, this is a safe procedure. However, problems may occur, including:  Having an allergic reaction to something in the donated blood.  Fever. This may be a reaction to the white blood cells in the transfused blood.  Iron overload. This can happen from having many transfusions.  Transfusion-related acute lung injury (TRALI). This is a rare reaction that causes lung damage. The cause is not known.TRALI can occur within hours of a transfusion or several days later.  Sudden (acute) or delayed hemolytic reactions. This happens if your blood does not match the cells in your transfusion. Your body's defense system (immune system) may try to attack the new cells. This complication is rare.  Infection. This is rare. BEFORE THE PROCEDURE  You may have a blood test to determine your blood type. This is necessary to know what kind of blood  your body will accept.  If you are going to have a planned surgery, you may donate your own blood. This may be done in case you need to have a transfusion.  If you have had an allergic reaction to a transfusion in the past, you may be given medicine to help prevent a reaction. Take this medicine only as directed by your health care provider.  You will have your temperature, blood pressure, and pulse monitored before the transfusion. PROCEDURE   An IV will be started in your hand or arm.  The bag of donated blood will be attached to your IV tube and given into your vein.  Your temperature, blood pressure, and pulse will be monitored regularly during the transfusion. This monitoring is done to detect early signs of a transfusion reaction.  If you have any signs or symptoms of a reaction, your transfusion will be stopped and you may be given medicine.  When the transfusion is over, your IV will be removed.  Pressure may be applied to the IV site for a few minutes.  A bandage (dressing) will be applied. The procedure may vary among health care providers and hospitals. AFTER THE PROCEDURE  Your blood pressure, temperature, and pulse will be monitored regularly.   This information is not intended to replace advice given to you by your health care provider. Make sure you discuss any questions you have with your health care provider.   Document Released: 01/05/2000 Document Revised: 01/28/2014 Document Reviewed: 11/17/2013 Elsevier Interactive Patient Education 2016 [employer]   Anemia, Nonspecific Anemia is a   condition in which the concentration of red blood cells or hemoglobin in the blood is below normal. Hemoglobin is a substance in red blood cells that carries oxygen to the tissues of the body. Anemia results in not enough oxygen reaching these tissues.  CAUSES  Common causes of anemia include:   Excessive bleeding. Bleeding may be internal or external. This includes excessive  bleeding from periods (in women) or from the intestine.   Poor nutrition.   Chronic kidney, thyroid, and liver disease.  Bone marrow disorders that decrease red blood cell production.  Cancer and treatments for cancer.  HIV, AIDS, and their treatments.  Spleen problems that increase red blood cell destruction.  Blood disorders.  Excess destruction of red blood cells due to infection, medicines, and autoimmune disorders. SIGNS AND SYMPTOMS   Minor weakness.   Dizziness.   Headache.  Palpitations.   Shortness of breath, especially with exercise.   Paleness.  Cold sensitivity.  Indigestion.  Nausea.  Difficulty sleeping.  Difficulty concentrating. Symptoms may occur suddenly or they may develop slowly.  DIAGNOSIS  Additional blood tests are often needed. These help your health care provider determine the best treatment. Your health care provider will check your stool for blood and look for other causes of blood loss.  TREATMENT  Treatment varies depending on the cause of the anemia. Treatment can include:   Supplements of iron, vitamin B12, or folic acid.   Hormone medicines.   A blood transfusion. This may be needed if blood loss is severe.   Hospitalization. This may be needed if there is significant continual blood loss.   Dietary changes.  Spleen removal. HOME CARE INSTRUCTIONS Keep all follow-up appointments. It often takes many weeks to correct anemia, and having your health care provider check on your condition and your response to treatment is very important. SEEK IMMEDIATE MEDICAL CARE IF:   You develop extreme weakness, shortness of breath, or chest pain.   You become dizzy or have trouble concentrating.  You develop heavy vaginal bleeding.   You develop a rash.   You have bloody or black, tarry stools.   You faint.   You vomit up blood.   You vomit repeatedly.   You have abdominal pain.  You have a fever or  persistent symptoms for more than 2-3 days.   You have a fever and your symptoms suddenly get worse.   You are dehydrated.  MAKE SURE YOU:  Understand these instructions.  Will watch your condition.  Will get help right away if you are not doing well or get worse.   This information is not intended to replace advice given to you by your health care provider. Make sure you discuss any questions you have with your health care provider.   Document Released: 02/15/2004 Document Revised: 09/09/2012 Document Reviewed: 07/03/2012 Elsevier Interactive Patient Education 2016 Elsevier Inc.  

## 2015-04-05 LAB — TYPE AND SCREEN
ABO/RH(D): O POS
Antibody Screen: NEGATIVE
Unit division: 0
Unit division: 0

## 2015-04-19 ENCOUNTER — Telehealth: Payer: Self-pay

## 2015-04-19 NOTE — Telephone Encounter (Signed)
Patient's wife calling requesting refills on both patient's pain medications- MS Contin and Percocet.  She would like to pick them both up tomorrow morning.

## 2015-04-26 ENCOUNTER — Other Ambulatory Visit (HOSPITAL_BASED_OUTPATIENT_CLINIC_OR_DEPARTMENT_OTHER): Payer: Medicare Other

## 2015-04-26 ENCOUNTER — Other Ambulatory Visit: Payer: Self-pay | Admitting: *Deleted

## 2015-04-26 ENCOUNTER — Telehealth: Payer: Self-pay | Admitting: Oncology

## 2015-04-26 ENCOUNTER — Ambulatory Visit (HOSPITAL_BASED_OUTPATIENT_CLINIC_OR_DEPARTMENT_OTHER): Payer: Medicare Other | Admitting: Oncology

## 2015-04-26 ENCOUNTER — Ambulatory Visit (HOSPITAL_BASED_OUTPATIENT_CLINIC_OR_DEPARTMENT_OTHER): Payer: Medicare Other

## 2015-04-26 VITALS — BP 99/60 | HR 88 | Temp 98.4°F | Resp 18 | Ht 74.0 in | Wt 177.6 lb

## 2015-04-26 DIAGNOSIS — C7951 Secondary malignant neoplasm of bone: Secondary | ICD-10-CM

## 2015-04-26 DIAGNOSIS — C61 Malignant neoplasm of prostate: Secondary | ICD-10-CM

## 2015-04-26 DIAGNOSIS — D630 Anemia in neoplastic disease: Secondary | ICD-10-CM

## 2015-04-26 DIAGNOSIS — E291 Testicular hypofunction: Secondary | ICD-10-CM

## 2015-04-26 DIAGNOSIS — M25511 Pain in right shoulder: Secondary | ICD-10-CM

## 2015-04-26 LAB — CBC WITH DIFFERENTIAL/PLATELET
BASO%: 0.7 % (ref 0.0–2.0)
Basophils Absolute: 0.1 10*3/uL (ref 0.0–0.1)
EOS%: 1 % (ref 0.0–7.0)
Eosinophils Absolute: 0.1 10*3/uL (ref 0.0–0.5)
HCT: 25.2 % — ABNORMAL LOW (ref 38.4–49.9)
HGB: 8 g/dL — ABNORMAL LOW (ref 13.0–17.1)
LYMPH#: 1.3 10*3/uL (ref 0.9–3.3)
LYMPH%: 10.4 % — AB (ref 14.0–49.0)
MCH: 29.2 pg (ref 27.2–33.4)
MCHC: 31.7 g/dL — AB (ref 32.0–36.0)
MCV: 92 fL (ref 79.3–98.0)
MONO#: 1 10*3/uL — ABNORMAL HIGH (ref 0.1–0.9)
MONO%: 7.9 % (ref 0.0–14.0)
NEUT#: 9.9 10*3/uL — ABNORMAL HIGH (ref 1.5–6.5)
NEUT%: 80 % — AB (ref 39.0–75.0)
Platelets: 196 10*3/uL (ref 140–400)
RBC: 2.74 10*6/uL — ABNORMAL LOW (ref 4.20–5.82)
RDW: 18 % — ABNORMAL HIGH (ref 11.0–14.6)
WBC: 12.4 10*3/uL — AB (ref 4.0–10.3)
nRBC: 3 % — ABNORMAL HIGH (ref 0–0)

## 2015-04-26 LAB — COMPREHENSIVE METABOLIC PANEL
ALT: 38 U/L (ref 0–55)
AST: 22 U/L (ref 5–34)
Albumin: 2.6 g/dL — ABNORMAL LOW (ref 3.5–5.0)
Alkaline Phosphatase: 109 U/L (ref 40–150)
Anion Gap: 7 mEq/L (ref 3–11)
BUN: 22.5 mg/dL (ref 7.0–26.0)
CHLORIDE: 106 meq/L (ref 98–109)
CO2: 24 meq/L (ref 22–29)
Calcium: 10.4 mg/dL (ref 8.4–10.4)
Creatinine: 1.1 mg/dL (ref 0.7–1.3)
EGFR: 75 mL/min/{1.73_m2} — ABNORMAL LOW (ref >90)
GLUCOSE: 102 mg/dL (ref 70–140)
POTASSIUM: 4.2 meq/L (ref 3.5–5.1)
SODIUM: 137 meq/L (ref 136–145)
Total Bilirubin: 0.36 mg/dL (ref 0.20–1.20)
Total Protein: 7.3 g/dL (ref 6.4–8.3)

## 2015-04-26 LAB — TECHNOLOGIST REVIEW

## 2015-04-26 MED ORDER — DENOSUMAB 120 MG/1.7ML ~~LOC~~ SOLN
120.0000 mg | Freq: Once | SUBCUTANEOUS | Status: AC
  Administered 2015-04-26: 120 mg via SUBCUTANEOUS
  Filled 2015-04-26: qty 1.7

## 2015-04-26 MED ORDER — OXYCODONE-ACETAMINOPHEN 5-325 MG PO TABS: 1.0000 | ORAL_TABLET | ORAL | Status: DC | PRN

## 2015-04-26 MED ORDER — MORPHINE SULFATE ER 30 MG PO TBCR: 30.0000 mg | EXTENDED_RELEASE_TABLET | Freq: Two times a day (BID) | ORAL | Status: DC

## 2015-04-26 NOTE — Telephone Encounter (Signed)
Gave and printed appt sched and avs for pt for may °

## 2015-04-26 NOTE — Progress Notes (Signed)
Hematology and Oncology Follow Up Visit  Marcus [name] [id number] 01-26-1945 70 y.o. 04/26/2015 9:12 AM   Principle Diagnosis: 70 year old gentleman diagnosed with prostate cancer in 2005. 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   Prior Therapy:  He is status post prostatectomy and subsequently was treated with salvage radiation therapy in 2007.  In February of 2013 his PSA was up to 22.5 and was started on androgen deprivation.  He developed castration resistant disease with documented bony metastasis despite combined androgen depravation with Lupron and Casodex. His last bone scan done on 09/18/2012 which showed progression of disease. Zytiga a total of 1000 mg started in August of 2014 for the PSA of 32.44. He was discontinued in November 2015 due to a rise in his PSA Xtandi 160 mg daily started in November 2015. Stopped on 05/13/2014 due to progression of disease.  Xofigo under the care of Dr. Tammi Klippel. First treatment given 06/09/2014. He is S/P 6 out of 6 completed on 11/17/2014.  Current therapy:  He is continued on Lupron at [facility] urology. His Xgeva to be given and the [place] since 06/2013.  He have refused systemic chemotherapy and will continue with supportive care only.  Interim History:  Marcus [name] presents today for a followup visit with his wife. Since his last visit, he reports doing reasonably well. He received 2 units of packed red cell transfusions 3 weeks ago which have helped his symptoms marginally. His performance status has not dramatically changed. He still has reasonable performance status and quality of life. He does drive short distances without any major issues.  His pain have been fairly controlled with long-acting pain medication and infrequent use of breakthrough. He reports needing breakthrough pain medication usually twice a day and sometimes less. He has reported slight increase in his shoulder pain which is  manageable at this time with pain medication.  He report any headaches, blurry vision, syncope or seizures. He does not report any abdominal pain or distention. He does not report any hematochezia or bleeding per rectum. He does not report any nausea or vomiting. Did not report any urinary symptoms of hematuria dysuria or incontinence. He has not reported any chest pain or difficulty breathing. Has not reported any cough or hemoptysis or wheezing. Has not reported any major changes in his bowel habits. He did not report any skin changes or petechiae. Has not reported any lymphadenopathy. Remainder of his review of system is unremarkable.  Medications: I have reviewed the patient's current medications.  Current Outpatient Prescriptions  Medication Sig Dispense Refill  . Albuterol Sulfate (PROAIR RESPICLICK) 123XX123 (90 BASE) MCG/ACT AEPB Inhale 2 puffs into the lungs 4 (four) times daily as needed. 1 each 1  . cholecalciferol (VITAMIN D) 1000 UNITS tablet Take 1,000 Units by mouth daily.    Marland Kitchen lisinopril-hydrochlorothiazide (PRINZIDE,ZESTORETIC) 20-25 MG tablet Take 1 tablet by mouth daily. 90 tablet 0  . megestrol (MEGACE) 40 MG/ML suspension TAKE TEN ML BY MOUTH ONCE DAILY 480 mL 2  . prochlorperazine (COMPAZINE) 10 MG tablet TAKE ONE TABLET BY MOUTH EVERY 6 HOURS AS NEEDED FOR NAUSEA AND  VOMITTING 60 tablet 1  . senna-docusate (SENNA S) 8.6-50 MG per tablet Take 1 tablet by mouth at bedtime. 30 tablet 3  . XOFIGO 27 MCCI/ML SOLN Inject 50 KBq/kg into the vein every 30 (thirty) days. For 6 monthly infusions in nuclear medicine 6 each 0  . morphine (MS CONTIN) 30 MG 12 hr tablet Take  1 tablet (30 mg total) by mouth every 12 (twelve) hours. 60 tablet 0  . oxyCODONE-acetaminophen (PERCOCET/ROXICET) 5-325 MG tablet Take 1-2 tablets by mouth every 4 (four) hours as needed for severe pain (Do not take more than 12 tablets per 24 hours.). 60 tablet 0   No current facility-administered medications for this  visit.     Allergies: No Known Allergies   Physical Exam: Blood pressure 99/60, pulse 88, temperature 98.4 F (36.9 C), temperature source Oral, resp. rate 18, height 6\' 2"  (1.88 m), weight 177 lb 9.6 oz (80.559 kg), SpO2 99 %. ECOG: 1 General appearance: alert awake gentleman without distress. Head: Normocephalic, without obvious abnormality no oral ulcers or lesions.  Neck: no adenopathy and No masses or thyromegaly. Lymph nodes: Cervical, supraclavicular, and axillary nodes normal. Heart:regular rate and rhythm, S1, S2. No murmurs rubs or gallops. Lung:chest clear, no wheezing, rales.  Abdomen: soft, non-tender, without masses or organomegaly no rebound or guarding.  EXT:no erythema, induration, no edema noted. Neurological: No deficits.    Lab Results: Lab Results  Component Value Date   WBC 12.4* 04/26/2015   HGB 8.0* 04/26/2015   HCT 25.2* 04/26/2015   MCV 92.0 04/26/2015   PLT 196 04/26/2015     Chemistry      Component Value Date/Time   NA 140 03/29/2015 0948   NA 135 11/08/2014 0001   K 4.5 03/29/2015 0948   K 4.0 11/08/2014 0001   CL 101 11/08/2014 0001   CO2 21* 03/29/2015 0948   CO2 26 11/08/2014 0001   BUN 19.9 03/29/2015 0948   BUN 16 11/08/2014 0001   CREATININE 1.0 03/29/2015 0948   CREATININE 1.09 11/08/2014 0001      Component Value Date/Time   CALCIUM 10.9* 03/29/2015 0948   CALCIUM 9.1 11/08/2014 0001   ALKPHOS 118 03/29/2015 0948   ALKPHOS 75 11/08/2014 0001   AST 23 03/29/2015 0948   AST 17 11/08/2014 0001   ALT 46 03/29/2015 0948   ALT 16 11/08/2014 0001   BILITOT <0.30 03/29/2015 0948   BILITOT 0.6 11/08/2014 0001        Results for Marcus [name], Marcus [name] (MRN [id number]) as of 04/26/2015 08:56  Ref. Range 02/28/2015 14:49 03/29/2015 09:48  PSA Latest Ref Range: <=4.00 ng/mL 196.8 (H) 204.90 (H)     Impression and Plan:  70 year old gentleman with the following issues:  1. Castration resistant prostate cancer with metastatic disease  to the bone. He has developed castration resistant disease and have progressed on both Zytiga and most recently on xtandi. His most recent PSA was up to 209.  He is status post 6 treatments with Xofigo under the care of Dr. Tammi Klippel with some temporary response in his PSA.  His PSA continues to be relatively stable for the last few months. His clinical status has not deteriorated rapidly at this time. He has declined systemic chemotherapy in the past and for the time being we'll continue with supportive care based on his preference.  2. Androgen depravation: Lupron will be administered if his testosterone increases. His last testosterone was less than 19 done in September 2016. This will be repeated in the future.   3. Bone directed therapy: He is on Xgeva monthly which he as tolerated it well. I continue to educate him about complications related to this medication including hypocalcemia and osteonecrosis of the jaw. Risks and benefits were reviewed and he was to continue.  4.Right shoulder pain: His pain is better at this time with  long-acting morphine and breakthrough Percocet. These will be continued without change and will be filled as needed. Palliative radiation therapy could be used in the future if his pain becomes an issue.  5. Constipation: MiraLax in the morning and continue Senokot S as well which have helped his symptoms. No issues noted at this time.   6. Anemia:  He has anemia of chronic disease, malignancy also related to [facility]. Hemoglobin has improved with transfusion. No need for transfusions noted.   7. Prognosis: He has an incurable malignancy and likely limited life expectancy. His clinical status appears stable and he declines, hospice needs to be involved. I continue to offer him the service for the time being.  8. Nutrition and diet: This was discussed today and different strategies to boost his nutritional status were reviewed. He is to increase nutritional supplements to  3 times a day.  9. Followup: Will be in about 4 weeks.   [facility] MD 4/5/20179:12 AM

## 2015-04-27 LAB — PSA: PROSTATE SPECIFIC AG, SERUM: 218.9 ng/mL — AB (ref 0.0–4.0)

## 2015-05-06 ENCOUNTER — Other Ambulatory Visit: Payer: Self-pay | Admitting: Family Medicine

## 2015-05-12 ENCOUNTER — Other Ambulatory Visit: Payer: Self-pay | Admitting: Family Medicine

## 2015-05-25 ENCOUNTER — Ambulatory Visit (HOSPITAL_BASED_OUTPATIENT_CLINIC_OR_DEPARTMENT_OTHER): Payer: Medicare Other | Admitting: Oncology

## 2015-05-25 ENCOUNTER — Telehealth: Payer: Self-pay | Admitting: Oncology

## 2015-05-25 ENCOUNTER — Ambulatory Visit (HOSPITAL_COMMUNITY)
Admission: RE | Admit: 2015-05-25 | Discharge: 2015-05-25 | Disposition: A | Discharge status: Home / Self Care | Payer: Medicare Other | Source: Ambulatory Visit | Attending: Oncology | Admitting: Oncology

## 2015-05-25 ENCOUNTER — Telehealth: Payer: Self-pay | Admitting: *Deleted

## 2015-05-25 ENCOUNTER — Other Ambulatory Visit (HOSPITAL_BASED_OUTPATIENT_CLINIC_OR_DEPARTMENT_OTHER): Payer: Medicare Other

## 2015-05-25 ENCOUNTER — Ambulatory Visit (HOSPITAL_BASED_OUTPATIENT_CLINIC_OR_DEPARTMENT_OTHER): Payer: Medicare Other

## 2015-05-25 VITALS — BP 121/82 | HR 81 | Temp 98.4°F | Resp 18 | Wt 176.5 lb

## 2015-05-25 DIAGNOSIS — C61 Malignant neoplasm of prostate: Secondary | ICD-10-CM | POA: Diagnosis not present

## 2015-05-25 DIAGNOSIS — D630 Anemia in neoplastic disease: Secondary | ICD-10-CM | POA: Insufficient documentation

## 2015-05-25 DIAGNOSIS — C7951 Secondary malignant neoplasm of bone: Secondary | ICD-10-CM

## 2015-05-25 DIAGNOSIS — E291 Testicular hypofunction: Secondary | ICD-10-CM

## 2015-05-25 LAB — COMPREHENSIVE METABOLIC PANEL
ALT: 29 U/L (ref 0–55)
ANION GAP: 11 meq/L (ref 3–11)
AST: 24 U/L (ref 5–34)
Albumin: 2.6 g/dL — ABNORMAL LOW (ref 3.5–5.0)
Alkaline Phosphatase: 116 U/L (ref 40–150)
BUN: 18.8 mg/dL (ref 7.0–26.0)
CALCIUM: 9.8 mg/dL (ref 8.4–10.4)
CHLORIDE: 108 meq/L (ref 98–109)
CO2: 21 meq/L — AB (ref 22–29)
Creatinine: 1.3 mg/dL (ref 0.7–1.3)
EGFR: 66 mL/min/{1.73_m2} — AB (ref >90)
Glucose: 96 mg/dl (ref 70–140)
POTASSIUM: 4 meq/L (ref 3.5–5.1)
Sodium: 139 mEq/L (ref 136–145)
Total Bilirubin: 0.31 mg/dL (ref 0.20–1.20)
Total Protein: 7 g/dL (ref 6.4–8.3)

## 2015-05-25 LAB — CBC WITH DIFFERENTIAL/PLATELET
BASO%: 1.3 % (ref 0.0–2.0)
Basophils Absolute: 0.1 10*3/uL (ref 0.0–0.1)
EOS ABS: 0.1 10*3/uL (ref 0.0–0.5)
EOS%: 1.1 % (ref 0.0–7.0)
HCT: 22.6 % — ABNORMAL LOW (ref 38.4–49.9)
HGB: 7 g/dL — ABNORMAL LOW (ref 13.0–17.1)
LYMPH%: 12.8 % — AB (ref 14.0–49.0)
MCH: 28.8 pg (ref 27.2–33.4)
MCHC: 31 g/dL — AB (ref 32.0–36.0)
MCV: 93 fL (ref 79.3–98.0)
MONO#: 0.8 10*3/uL (ref 0.1–0.9)
MONO%: 7.5 % (ref 0.0–14.0)
NEUT%: 77.3 % — AB (ref 39.0–75.0)
NEUTROS ABS: 8 10*3/uL — AB (ref 1.5–6.5)
PLATELETS: 170 10*3/uL (ref 140–400)
RBC: 2.43 10*6/uL — AB (ref 4.20–5.82)
RDW: 18.8 % — ABNORMAL HIGH (ref 11.0–14.6)
WBC: 10.3 10*3/uL (ref 4.0–10.3)
lymph#: 1.3 10*3/uL (ref 0.9–3.3)
nRBC: 12 % — ABNORMAL HIGH (ref 0–0)

## 2015-05-25 LAB — TECHNOLOGIST REVIEW

## 2015-05-25 MED ORDER — DIPHENHYDRAMINE HCL 50 MG/ML IJ SOLN: 50.0000 mg | Freq: Once | INTRAMUSCULAR | Status: DC | PRN

## 2015-05-25 MED ORDER — SODIUM CHLORIDE 0.9 % IV SOLN: Freq: Once | INTRAVENOUS | Status: DC | PRN

## 2015-05-25 MED ORDER — MORPHINE SULFATE ER 30 MG PO TBCR: 30.0000 mg | EXTENDED_RELEASE_TABLET | Freq: Two times a day (BID) | ORAL | Status: DC

## 2015-05-25 MED ORDER — OXYCODONE-ACETAMINOPHEN 5-325 MG PO TABS
1.0000 | ORAL_TABLET | ORAL | Status: DC | PRN
Start: 2015-05-25 — End: 2015-06-22

## 2015-05-25 MED ORDER — EPINEPHRINE HCL 0.1 MG/ML IJ SOLN: 0.2500 mg | Freq: Once | INTRAMUSCULAR | Status: DC | PRN

## 2015-05-25 MED ORDER — DENOSUMAB 120 MG/1.7ML ~~LOC~~ SOLN
120.0000 mg | Freq: Once | SUBCUTANEOUS | Status: AC
  Administered 2015-05-25: 120 mg via SUBCUTANEOUS
  Filled 2015-05-25: qty 1.7

## 2015-05-25 MED ORDER — METHYLPREDNISOLONE SODIUM SUCC 125 MG IJ SOLR: 125.0000 mg | Freq: Once | INTRAMUSCULAR | Status: DC | PRN

## 2015-05-25 MED ORDER — DIPHENHYDRAMINE HCL 50 MG/ML IJ SOLN: 25.0000 mg | Freq: Once | INTRAMUSCULAR | Status: DC | PRN

## 2015-05-25 MED ORDER — ALBUTEROL SULFATE (2.5 MG/3ML) 0.083% IN NEBU
2.5000 mg | INHALATION_SOLUTION | Freq: Once | RESPIRATORY_TRACT | Status: DC | PRN
  Filled 2015-05-25: qty 3

## 2015-05-25 NOTE — Telephone Encounter (Signed)
per pof to sch pt appt-gave pt copyof avs-MW sch blood

## 2015-05-25 NOTE — Telephone Encounter (Signed)
Per staff message and POF I have scheduled appts. Advised scheduler of appts. JMW  

## 2015-05-25 NOTE — Progress Notes (Signed)
Hematology and Oncology Follow Up Visit  Marcus [name] [id number] Dec 11, 1945 70 y.o. 05/25/2015 9:24 AM   Principle Diagnosis: 70 year old gentleman diagnosed with prostate cancer in 2005. 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   Prior Therapy:  He is status post prostatectomy and subsequently was treated with salvage radiation therapy in 2007.  In February of 2013 his PSA was up to 22.5 and was started on androgen deprivation.  He developed castration resistant disease with documented bony metastasis despite combined androgen depravation with Lupron and Casodex. His last bone scan done on 09/18/2012 which showed progression of disease. Zytiga a total of 1000 mg started in August of 2014 for the PSA of 32.44. He was discontinued in November 2015 due to a rise in his PSA Xtandi 160 mg daily started in November 2015. Stopped on 05/13/2014 due to progression of disease.  Xofigo under the care of Dr. Tammi [name]. First treatment given 06/09/2014. He is S/P 6 out of 6 completed on 11/17/2014.  Current therapy:  He is continued on Lupron at [facility] urology. His Xgeva to be given and the [place] since 06/2013.  He have refused systemic chemotherapy and will continue with supportive care only.  Interim History:  Marcus [name] presents today for a followup visit with his wife. Since his last visit, he reports no major changes in his health. He does report excessive fatigue and tiredness at times but continues to maintain a reasonable quality of life. He has reported more constipation associated with his pain medication and has not been taking stool softeners on a regular basis. Appetite remains reasonable and have maintained weight. He denied any falls or syncope. He denied any major changes in his performance status or pain scores. He is reporting more fatigue and dyspnea on exertion in the last few weeks.  His pain have been fairly controlled with  long-acting pain medication and infrequent use of breakthrough of Percocet. He reports needing breakthrough pain medication have not changed dramatically and does not require increase in his long-acting pain medication.  He report any headaches, blurry vision, syncope or seizures. He does not report any abdominal pain or distention. He does not report any hematochezia or bleeding per rectum. He does not report any nausea or vomiting. Did not report any urinary symptoms of hematuria dysuria or incontinence. He has not reported any chest pain or difficulty breathing. Has not reported any cough or hemoptysis or wheezing. He did not report any skin changes or petechiae. Has not reported any lymphadenopathy. Remainder of his review of system is unremarkable.  Medications: I have reviewed the patient's current medications.  Current Outpatient Prescriptions  Medication Sig Dispense Refill  . Albuterol Sulfate (PROAIR RESPICLICK) 123XX123 (90 BASE) MCG/ACT AEPB Inhale 2 puffs into the lungs 4 (four) times daily as needed. 1 each 1  . cholecalciferol (VITAMIN D) 1000 UNITS tablet Take 1,000 Units by mouth daily.    Marland Kitchen lisinopril-hydrochlorothiazide (PRINZIDE,ZESTORETIC) 20-25 MG tablet TAKE ONE TABLET BY MOUTH ONCE DAILY 90 tablet 1  . megestrol (MEGACE) 40 MG/ML suspension TAKE TEN ML BY MOUTH ONCE DAILY 480 mL 2  . morphine (MS CONTIN) 30 MG 12 hr tablet Take 1 tablet (30 mg total) by mouth every 12 (twelve) hours. 60 tablet 0  . oxyCODONE-acetaminophen (PERCOCET/ROXICET) 5-325 MG tablet Take 1-2 tablets by mouth every 4 (four) hours as needed for severe pain (Do not take more than 12 tablets per 24 hours.). 60 tablet 0  . prochlorperazine (  COMPAZINE) 10 MG tablet TAKE ONE TABLET BY MOUTH EVERY 6 HOURS AS NEEDED FOR NAUSEA AND  VOMITTING 60 tablet 1  . senna-docusate (SENNA S) 8.6-50 MG per tablet Take 1 tablet by mouth at bedtime. 30 tablet 3  . XOFIGO 27 MCCI/ML SOLN Inject 50 KBq/kg into the vein every 30  (thirty) days. For 6 monthly infusions in nuclear medicine 6 each 0   No current facility-administered medications for this visit.     Allergies: No Known Allergies   Physical Exam: Blood pressure 121/82, pulse 81, temperature 98.4 F (36.9 C), temperature source Oral, resp. rate 18, weight 176 lb 8 oz (80.06 kg), SpO2 99 %. ECOG: 1 General appearance: Chronically ill-appearing gentleman without distress today. Head: Normocephalic, without obvious abnormality no oral ulcers or lesions.  Neck: no adenopathy and No masses or thyromegaly. Lymph nodes: Cervical, supraclavicular, and axillary nodes normal. Heart:regular rate and rhythm, S1, S2. No murmurs rubs or gallops. Lung:chest clear, no wheezing, rales. No dullness to percussion. Abdomen: soft, non-tender, without masses or organomegaly no shifting dullness or ascites. EXT:no erythema, induration, no edema noted. Neurological: No deficits.    Lab Results: Lab Results  Component Value Date   WBC 10.3 05/25/2015   HGB 7.0* 05/25/2015   HCT 22.6* 05/25/2015   MCV 93.0 05/25/2015   PLT 170 05/25/2015     Chemistry      Component Value Date/Time   NA 137 04/26/2015 0841   NA 135 11/08/2014 0001   K 4.2 04/26/2015 0841   K 4.0 11/08/2014 0001   CL 101 11/08/2014 0001   CO2 24 04/26/2015 0841   CO2 26 11/08/2014 0001   BUN 22.5 04/26/2015 0841   BUN 16 11/08/2014 0001   CREATININE 1.1 04/26/2015 0841   CREATININE 1.09 11/08/2014 0001      Component Value Date/Time   CALCIUM 10.4 04/26/2015 0841   CALCIUM 9.1 11/08/2014 0001   ALKPHOS 109 04/26/2015 0841   ALKPHOS 75 11/08/2014 0001   AST 22 04/26/2015 0841   AST 17 11/08/2014 0001   ALT 38 04/26/2015 0841   ALT 16 11/08/2014 0001   BILITOT 0.36 04/26/2015 0841   BILITOT 0.6 11/08/2014 0001        Results for Marcus [name] (MRN [id number]) as of 05/25/2015 09:04  Ref. Range 03/29/2015 09:48 03/29/2015 09:48 04/26/2015 08:41  PSA Latest Ref Range: 0.0-4.0 ng/mL  204.90 (H) 189.8 (H) 218.9 (H)      Impression and Plan:  70 year old gentleman with the following issues:  1. Castration resistant prostate cancer with metastatic disease to the bone. He has developed castration resistant disease and have progressed on both Zytiga and most recently on xtandi. His most recent PSA was up to 209.  He is status post 6 treatments with Xofigo under the care of Dr. Tammi [name] with some temporary response in his PSA.  His PSA did rise slightly up to 218 but his clinical status have not changed dramatically. Continue to address with him options of treatments including systemic chemotherapy but he continues to decline. He continued to perform supportive care only.  2. Androgen depravation: Lupron will be administered if his testosterone increases. His last testosterone was less than 19 done in September 2016. Testosterone will be repeated with the next visit.  3. Bone directed therapy: He is on Xgeva monthly which he as tolerated it well. I continue to educate him about complications related to this medication including hypocalcemia and osteonecrosis of the jaw. He continues to agree  to receive it.  4.Right shoulder pain/diffuse bone pain: His pain is better at this time with long-acting morphine and breakthrough Percocet. These will be continued without change and will be filled as needed. Palliative radiation therapy could be an option down the line if his pain becomes out of control.  5. Constipation: MiraLax in the morning and continue Senokot S which have helped his symptoms when he takes it. I continue to encourage him to take it and I emphasized the importance good bowel regimen associated with long-acting narcotics.  6. Anemia:  He has anemia of chronic disease, malignancy also related to [facility]. Hemoglobin has declined since the last visit and he is symptomatic. We will set him up with packed red cells transfusion next week.   7. Prognosis: He has an incurable  malignancy and likely limited life expectancy. His clinical status appears stable and I continue to remind him about the option of hospice enrollment if he declines further.  8. Nutrition and diet: This was discussed today and different strategies to boost his nutritional status were reviewed. He is to increase nutritional supplements to 3 times a day. His weight have been stable.  9. Followup: Will be in about 4 weeks.   [facility] MD 5/4/20179:24 AM

## 2015-05-26 LAB — PSA: Prostate Specific Ag, Serum: 241.4 ng/mL — ABNORMAL HIGH (ref 0.0–4.0)

## 2015-05-30 ENCOUNTER — Other Ambulatory Visit: Payer: Medicare Other

## 2015-05-30 ENCOUNTER — Ambulatory Visit (HOSPITAL_BASED_OUTPATIENT_CLINIC_OR_DEPARTMENT_OTHER): Payer: Medicare Other

## 2015-05-30 VITALS — BP 137/89 | HR 67 | Temp 99.3°F | Resp 18

## 2015-05-30 DIAGNOSIS — C7951 Secondary malignant neoplasm of bone: Secondary | ICD-10-CM | POA: Diagnosis not present

## 2015-05-30 DIAGNOSIS — D630 Anemia in neoplastic disease: Secondary | ICD-10-CM | POA: Diagnosis not present

## 2015-05-30 DIAGNOSIS — C61 Malignant neoplasm of prostate: Secondary | ICD-10-CM

## 2015-05-30 LAB — PREPARE RBC (CROSSMATCH)

## 2015-05-30 MED ORDER — ACETAMINOPHEN 325 MG PO TABS
ORAL_TABLET | ORAL | Status: AC
Start: 2015-05-30 — End: 2015-05-30
  Filled 2015-05-30: qty 2

## 2015-05-30 MED ORDER — HEPARIN SOD (PORK) LOCK FLUSH 100 UNIT/ML IV SOLN
250.0000 [IU] | INTRAVENOUS | Status: DC | PRN
  Filled 2015-05-30: qty 5

## 2015-05-30 MED ORDER — ACETAMINOPHEN 325 MG PO TABS
650.0000 mg | ORAL_TABLET | Freq: Once | ORAL | Status: AC
  Administered 2015-05-30: 650 mg via ORAL

## 2015-05-30 MED ORDER — SODIUM CHLORIDE 0.9 % IV SOLN
250.0000 mL | Freq: Once | INTRAVENOUS | Status: AC
  Administered 2015-05-30: 250 mL via INTRAVENOUS

## 2015-05-30 MED ORDER — DIPHENHYDRAMINE HCL 25 MG PO CAPS
ORAL_CAPSULE | ORAL | Status: AC
  Filled 2015-05-30: qty 1

## 2015-05-30 MED ORDER — DIPHENHYDRAMINE HCL 25 MG PO CAPS
25.0000 mg | ORAL_CAPSULE | Freq: Once | ORAL | Status: AC
  Administered 2015-05-30: 25 mg via ORAL

## 2015-05-30 NOTE — Patient Instructions (Addendum)
Blood Transfusion  A blood transfusion is a procedure in which you receive donated blood through an IV tube. You may need a blood transfusion because of illness, surgery, or injury. The blood may come from a donor, or it may be your own blood that you donated previously. The blood given in a transfusion is made up of different types of cells. You may receive:  Red blood cells. These carry oxygen and replace lost blood.  Platelets. These control bleeding.  Plasma. Thishelps blood to clot. If you have hemophilia or another clotting disorder, you may also receive other types of blood products. LET YOUR HEALTH CARE PROVIDER KNOW ABOUT:  Any allergies you have.  All medicines you are taking, including vitamins, herbs, eye drops, creams, and over-the-counter medicines.  Previous problems you or members of your family have had with the use of anesthetics.  Any blood disorders you have.  Previous surgeries you have had.  Any medical conditions you may have.  Any previous reactions you have had during a blood transfusion.  RISKS AND COMPLICATIONS Generally, this is a safe procedure. However, problems may occur, including:  Having an allergic reaction to something in the donated blood.  Fever. This may be a reaction to the white blood cells in the transfused blood.  Iron overload. This can happen from having many transfusions.  Transfusion-related acute lung injury (TRALI). This is a rare reaction that causes lung damage. The cause is not known.TRALI can occur within hours of a transfusion or several days later.  Sudden (acute) or delayed hemolytic reactions. This happens if your blood does not match the cells in your transfusion. Your body's defense system (immune system) may try to attack the new cells. This complication is rare.  Infection. This is rare. BEFORE THE PROCEDURE  You may have a blood test to determine your blood type. This is necessary to know what kind of blood your  body will accept.  If you are going to have a planned surgery, you may donate your own blood. This may be done in case you need to have a transfusion.  If you have had an allergic reaction to a transfusion in the past, you may be given medicine to help prevent a reaction. Take this medicine only as directed by your health care provider.  You will have your temperature, blood pressure, and pulse monitored before the transfusion. PROCEDURE   An IV will be started in your hand or arm.  The bag of donated blood will be attached to your IV tube and given into your vein.  Your temperature, blood pressure, and pulse will be monitored regularly during the transfusion. This monitoring is done to detect early signs of a transfusion reaction.  If you have any signs or symptoms of a reaction, your transfusion will be stopped and you may be given medicine.  When the transfusion is over, your IV will be removed.  Pressure may be applied to the IV site for a few minutes.  A bandage (dressing) will be applied. The procedure may vary among health care providers and hospitals. AFTER THE PROCEDURE  Your blood pressure, temperature, and pulse will be monitored regularly.   This information is not intended to replace advice given to you by your health care provider. Make sure you discuss any questions you have with your health care provider.   Document Released: 01/05/2000 Document Revised: 01/28/2014 Document Reviewed: 11/17/2013 Elsevier Interactive Patient Education 2016 [employer]vier Inc. Blood Transfusion, Care After Refer to this sheet   in the next few weeks. These instructions provide you with information about caring for yourself after your procedure. Your health care provider may also give you more specific instructions. Your treatment has been planned according to current medical practices, but problems sometimes occur. Call your health care provider if you have any problems or questions after your  procedure. WHAT TO EXPECT AFTER THE PROCEDURE After your procedure, it is common to have: Bruising and soreness at the IV site. Chills or fever. Headache. HOME CARE INSTRUCTIONS Take medicines only as directed by your health care provider. Ask your health care provider if you can take an over-the-counter pain reliever in case you have a fever or headache a day or two after your transfusion. Return to your normal activities as directed by your health care provider. SEEK MEDICAL CARE IF:  You develop redness or irritation at your IV site. You have persistent fever, chills, or headache. Your urine is darker than normal. Your urine turns pink, red, or brown.  The white part of your eye turns yellow (jaundice).  You feel weak after doing your normal activities.  SEEK IMMEDIATE MEDICAL CARE IF:  You have trouble breathing. You have fever and chills along with: Anxiety. Chest or back pain. Flushed skin. Clammy skin. A rapid heartbeat. Nausea.   This information is not intended to replace advice given to you by your health care provider. Make sure you discuss any questions you have with your health care provider.   Document Released: 01/28/2014 Document Reviewed: 01/28/2014 Elsevier Interactive Patient Education 2016 [employer] Inc[employer]  

## 2015-05-31 LAB — TYPE AND SCREEN
ABO/RH(D): O POS
Antibody Screen: NEGATIVE
UNIT DIVISION: 0
UNIT DIVISION: 0

## 2015-06-22 ENCOUNTER — Telehealth: Payer: Self-pay | Admitting: Oncology

## 2015-06-22 ENCOUNTER — Other Ambulatory Visit (HOSPITAL_BASED_OUTPATIENT_CLINIC_OR_DEPARTMENT_OTHER): Payer: Medicare Other

## 2015-06-22 ENCOUNTER — Ambulatory Visit: Payer: Medicare Other

## 2015-06-22 ENCOUNTER — Ambulatory Visit (HOSPITAL_BASED_OUTPATIENT_CLINIC_OR_DEPARTMENT_OTHER): Payer: Medicare Other | Admitting: Oncology

## 2015-06-22 ENCOUNTER — Other Ambulatory Visit: Payer: Self-pay | Admitting: *Deleted

## 2015-06-22 VITALS — BP 112/73 | HR 73 | Temp 98.2°F | Resp 17 | Ht 74.0 in | Wt 174.5 lb

## 2015-06-22 DIAGNOSIS — K5900 Constipation, unspecified: Secondary | ICD-10-CM

## 2015-06-22 DIAGNOSIS — D630 Anemia in neoplastic disease: Secondary | ICD-10-CM

## 2015-06-22 DIAGNOSIS — C61 Malignant neoplasm of prostate: Secondary | ICD-10-CM

## 2015-06-22 DIAGNOSIS — C7951 Secondary malignant neoplasm of bone: Secondary | ICD-10-CM

## 2015-06-22 DIAGNOSIS — R972 Elevated prostate specific antigen [PSA]: Secondary | ICD-10-CM

## 2015-06-22 DIAGNOSIS — Z79899 Other long term (current) drug therapy: Secondary | ICD-10-CM

## 2015-06-22 DIAGNOSIS — M25511 Pain in right shoulder: Secondary | ICD-10-CM

## 2015-06-22 LAB — COMPREHENSIVE METABOLIC PANEL
ALBUMIN: 2.3 g/dL — AB (ref 3.5–5.0)
ALK PHOS: 158 U/L — AB (ref 40–150)
ALT: 49 U/L (ref 0–55)
AST: 33 U/L (ref 5–34)
Anion Gap: 7 mEq/L (ref 3–11)
BUN: 14.7 mg/dL (ref 7.0–26.0)
CO2: 24 meq/L (ref 22–29)
Calcium: 10 mg/dL (ref 8.4–10.4)
Chloride: 106 mEq/L (ref 98–109)
Creatinine: 1 mg/dL (ref 0.7–1.3)
EGFR: 89 mL/min/{1.73_m2} — AB (ref >90)
GLUCOSE: 108 mg/dL (ref 70–140)
Potassium: 4.4 mEq/L (ref 3.5–5.1)
SODIUM: 136 meq/L (ref 136–145)
TOTAL PROTEIN: 7 g/dL (ref 6.4–8.3)
Total Bilirubin: 0.34 mg/dL (ref 0.20–1.20)

## 2015-06-22 LAB — CBC WITH DIFFERENTIAL/PLATELET
BASO%: 0.9 % (ref 0.0–2.0)
Basophils Absolute: 0.2 10*3/uL — ABNORMAL HIGH (ref 0.0–0.1)
EOS%: 0.8 % (ref 0.0–7.0)
Eosinophils Absolute: 0.1 10*3/uL (ref 0.0–0.5)
HCT: 24.6 % — ABNORMAL LOW (ref 38.4–49.9)
HEMOGLOBIN: 7.7 g/dL — AB (ref 13.0–17.1)
LYMPH%: 8 % — ABNORMAL LOW (ref 14.0–49.0)
MCH: 28.1 pg (ref 27.2–33.4)
MCHC: 31.3 g/dL — ABNORMAL LOW (ref 32.0–36.0)
MCV: 89.8 fL (ref 79.3–98.0)
MONO#: 0.9 10*3/uL (ref 0.1–0.9)
MONO%: 5.2 % (ref 0.0–14.0)
NEUT%: 85.1 % — ABNORMAL HIGH (ref 39.0–75.0)
NEUTROS ABS: 14.6 10*3/uL — AB (ref 1.5–6.5)
NRBC: 5 % — AB (ref 0–0)
Platelets: 147 10*3/uL (ref 140–400)
RBC: 2.74 10*6/uL — ABNORMAL LOW (ref 4.20–5.82)
RDW: 19.1 % — AB (ref 11.0–14.6)
WBC: 17.2 10*3/uL — AB (ref 4.0–10.3)
lymph#: 1.4 10*3/uL (ref 0.9–3.3)

## 2015-06-22 LAB — TECHNOLOGIST REVIEW

## 2015-06-22 MED ORDER — OXYCODONE-ACETAMINOPHEN 5-325 MG PO TABS: 1.0000 | ORAL_TABLET | ORAL | Status: DC | PRN

## 2015-06-22 MED ORDER — PROCHLORPERAZINE MALEATE 10 MG PO TABS: ORAL_TABLET | ORAL | Status: DC

## 2015-06-22 MED ORDER — PROCHLORPERAZINE MALEATE 10 MG PO TABS: ORAL_TABLET | ORAL | Status: AC

## 2015-06-22 MED ORDER — MORPHINE SULFATE ER 30 MG PO TBCR: 30.0000 mg | EXTENDED_RELEASE_TABLET | Freq: Two times a day (BID) | ORAL | Status: DC

## 2015-06-22 NOTE — Telephone Encounter (Signed)
Gave and printed appt sched and avs for pt for June °

## 2015-06-22 NOTE — Progress Notes (Signed)
Hematology and Oncology Follow Up Visit  ASA HARSHA [id number] September 20, 1945 70 y.o. 06/22/2015 11:59 AM   Principle Diagnosis: 70 year old gentleman diagnosed with prostate cancer in 2005. 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   Prior Therapy:  He is status post prostatectomy and subsequently was treated with salvage radiation therapy in 2007.  In February of 2013 his PSA was up to 22.5 and was started on androgen deprivation.  He developed castration resistant disease with documented bony metastasis despite combined androgen depravation with Lupron and Casodex. His last bone scan done on 09/18/2012 which showed progression of disease. Zytiga a total of 1000 mg started in August of 2014 for the PSA of 32.44. He was discontinued in November 2015 due to a rise in his PSA Xtandi 160 mg daily started in November 2015. Stopped on 05/13/2014 due to progression of disease.  Xofigo under the care of Dr. Tammi Klippel. First treatment given 06/09/2014. He is S/P 6 out of 6 completed on 11/17/2014.  Current therapy:  He is continued on Lupron at [facility] urology. His Xgeva to be given and the [place] since 06/2013.  He have refused systemic chemotherapy and will continue with supportive care only.  Interim History:  Mr. Michaelson presents today for a followup visit with his wife. Since his last visit, he reports slight decline in his overall health. He reports less activity and confined to chair and his house. He walks outside occasionally gets fatigued fairly quickly. Appetite remains poor at this time and have lost 1 pound since last visit. He is using nutritional supplements which have helped at this time. He denied any falls or syncope. He denied any major changes in his performance status or pain scores.   His pain have been fairly controlled with long-acting pain medication and infrequent use of breakthrough of Percocet. He reports needing breakthrough  pain medication have not changed dramatically and does not require increase in his long-acting pain medication.  He report any headaches, blurry vision, syncope or seizures. He does not report any abdominal pain or distention. He does not report any hematochezia or bleeding per rectum. He does not report any nausea or vomiting. Did not report any urinary symptoms of hematuria dysuria or incontinence. He has not reported any chest pain or difficulty breathing. Has not reported any cough or hemoptysis or wheezing. He did not report any skin changes or petechiae. Has not reported any lymphadenopathy. Remainder of his review of system is unremarkable.  Medications: I have reviewed the patient's current medications.  Current Outpatient Prescriptions  Medication Sig Dispense Refill  . Albuterol Sulfate (PROAIR RESPICLICK) 123XX123 (90 BASE) MCG/ACT AEPB Inhale 2 puffs into the lungs 4 (four) times daily as needed. 1 each 1  . cholecalciferol (VITAMIN D) 1000 UNITS tablet Take 1,000 Units by mouth daily.    Marland Kitchen lisinopril-hydrochlorothiazide (PRINZIDE,ZESTORETIC) 20-25 MG tablet TAKE ONE TABLET BY MOUTH ONCE DAILY 90 tablet 1  . megestrol (MEGACE) 40 MG/ML suspension TAKE TEN ML BY MOUTH ONCE DAILY 480 mL 2  . morphine (MS CONTIN) 30 MG 12 hr tablet Take 1 tablet (30 mg total) by mouth every 12 (twelve) hours. 60 tablet 0  . oxyCODONE-acetaminophen (PERCOCET/ROXICET) 5-325 MG tablet Take 1-2 tablets by mouth every 4 (four) hours as needed for severe pain (Do not take more than 12 tablets per 24 hours.). 60 tablet 0  . senna-docusate (SENNA S) 8.6-50 MG per tablet Take 1 tablet by mouth at bedtime. 30 tablet  3  . XOFIGO 27 MCCI/ML SOLN Inject 50 KBq/kg into the vein every 30 (thirty) days. For 6 monthly infusions in nuclear medicine 6 each 0  . prochlorperazine (COMPAZINE) 10 MG tablet TAKE ONE TABLET BY MOUTH EVERY 6 HOURS AS NEEDED FOR NAUSEA AND  VOMITTING 60 tablet 1   No current facility-administered  medications for this visit.     Allergies: No Known Allergies   Physical Exam: Blood pressure 112/73, pulse 73, temperature 98.2 F (36.8 C), temperature source Oral, resp. rate 17, height 6\' 2"  (1.88 m), weight 174 lb 8 oz (79.153 kg), SpO2 100 %. ECOG: 2 General appearance: Alert, awake gentleman appeared without distress. Head: Normocephalic, without obvious abnormality no oral ulcers or lesions.  Neck: no adenopathy and No masses or thyromegaly. Lymph nodes: Cervical, supraclavicular, and axillary nodes normal. Heart:regular rate and rhythm, S1, S2. No murmurs rubs or gallops. Lung:chest clear, no wheezing, rales. No dullness to percussion. Abdomen: soft, non-tender, without masses or organomegaly no rebound or guarding. EXT:no erythema, induration, no edema noted. Neurological: No deficits.    Lab Results: Lab Results  Component Value Date   WBC 17.2* 06/22/2015   HGB 7.7* 06/22/2015   HCT 24.6* 06/22/2015   MCV 89.8 06/22/2015   PLT 147 06/22/2015     Chemistry      Component Value Date/Time   NA 139 05/25/2015 0848   NA 135 11/08/2014 0001   K 4.0 05/25/2015 0848   K 4.0 11/08/2014 0001   CL 101 11/08/2014 0001   CO2 21* 05/25/2015 0848   CO2 26 11/08/2014 0001   BUN 18.8 05/25/2015 0848   BUN 16 11/08/2014 0001   CREATININE 1.3 05/25/2015 0848   CREATININE 1.09 11/08/2014 0001      Component Value Date/Time   CALCIUM 9.8 05/25/2015 0848   CALCIUM 9.1 11/08/2014 0001   ALKPHOS 116 05/25/2015 0848   ALKPHOS 75 11/08/2014 0001   AST 24 05/25/2015 0848   AST 17 11/08/2014 0001   ALT 29 05/25/2015 0848   ALT 16 11/08/2014 0001   BILITOT 0.31 05/25/2015 0848   BILITOT 0.6 11/08/2014 0001       Results for GEARALD, BOCKUS (MRN [id number]) as of 06/22/2015 11:45  Ref. Range 03/29/2015 09:48 04/26/2015 08:41 05/25/2015 08:48  PSA Latest Ref Range: 0.0-4.0 ng/mL 189.8 (H) 218.9 (H) 241.4 (H)        Impression and Plan:  70 year old gentleman with the  following issues:  1. Castration resistant prostate cancer with metastatic disease to the bone. He has developed castration resistant disease and have progressed on both Zytiga and most recently on xtandi. His most recent PSA was up to 209.  He is status post 6 treatments with Xofigo under the care of Dr. Tammi Klippel with some temporary response in his PSA.  His PSA continues to rise slowly and he continues to decline chemotherapy. The plan is to continue with supportive care only.   2. Androgen depravation: Lupron will be administered if his testosterone increases. Testosterone is repeated today.  3. Bone directed therapy: He is on Xgeva monthly which he as tolerated it well. I continue to educate him about complications related to this medication including hypocalcemia and osteonecrosis of the jaw. He declined his injection today and we will move this to every other month.  4.Right shoulder pain/diffuse bone pain: His pain is better at this time with long-acting morphine and breakthrough Percocet. These will be continued without change and will be filled as needed.  5. Constipation: MiraLax in the morning and continue Senokot S which have helped his symptoms when he takes it.  6. Anemia:  He has anemia of chronic disease, malignancy also related to [facility]. Hemoglobin has declined since the last visit and he is symptomatic. He did not feel any major improvement with transfusions. We will hold off any further transfusions to time being.  7. Prognosis: He has an incurable malignancy and likely limited life expectancy. I continue to remind him about the option of hospice enrollment if he declines further. He appears to have slight clinical decline and likely will continue to do so over the next few months.  8. Nutrition and diet: This was discussed today and different strategies to boost his nutritional status were reviewed. He is to increase nutritional supplements to 3 times a day. His weight is  relatively stable since last month.  9. Followup: Will be in about 4 weeks.   [facility] MD 6/1/201711:59 AM

## 2015-06-23 LAB — PSA: PROSTATE SPECIFIC AG, SERUM: 377.1 ng/mL — AB (ref 0.0–4.0)

## 2015-06-23 LAB — TESTOSTERONE: Testosterone, Serum: 9 ng/dL — ABNORMAL LOW (ref 348–1197)

## 2015-06-26 ENCOUNTER — Telehealth: Payer: Self-pay

## 2015-06-26 NOTE — Telephone Encounter (Signed)
Pt called requesting to speak to [facility] pt back: is trying to get an appt for blood transfusion. Next appt is June 28th. Family going [place] on July 4. Wife is asking for an appt sometime between 6/12 and 6/23 for lab with possible transfusion so he can feel well when going OOT.Marland Kitchen

## 2015-06-27 NOTE — Telephone Encounter (Signed)
Patient family member called again concerning blood transfusion. Please advise.

## 2015-06-27 NOTE — Telephone Encounter (Signed)
This RN spoke with patient regarding appointment for blood transfusion. Patient is not sure what date and time he can come. He needs to talk with his wife. He is to call [place] back to arrange this. Patient verbalized understanding.

## 2015-06-28 ENCOUNTER — Other Ambulatory Visit: Payer: Self-pay | Admitting: *Deleted

## 2015-06-28 ENCOUNTER — Telehealth: Payer: Self-pay | Admitting: Oncology

## 2015-06-28 ENCOUNTER — Telehealth: Payer: Self-pay | Admitting: *Deleted

## 2015-06-28 ENCOUNTER — Encounter: Payer: Self-pay | Admitting: *Deleted

## 2015-06-28 DIAGNOSIS — D649 Anemia, unspecified: Secondary | ICD-10-CM

## 2015-06-28 NOTE — Telephone Encounter (Signed)
Wife callint to request an earlier appt for lab and possible blood transfusion. pof to schedulers for lab and 5 hour transfusion, the week of June 12th

## 2015-06-28 NOTE — Telephone Encounter (Signed)
spoke w/ pt wife confirmed apt for 6/13

## 2015-06-28 NOTE — Telephone Encounter (Signed)
Per staff message and POF I have scheduled appts. Advised scheduler of appts. JMW  

## 2015-06-30 NOTE — Telephone Encounter (Signed)
No note

## 2015-07-04 ENCOUNTER — Other Ambulatory Visit: Payer: Self-pay | Admitting: *Deleted

## 2015-07-04 ENCOUNTER — Ambulatory Visit (HOSPITAL_COMMUNITY)
Admission: RE | Admit: 2015-07-04 | Discharge: 2015-07-04 | Disposition: A | Discharge status: Home / Self Care | Payer: Medicare Other | Source: Ambulatory Visit | Attending: Oncology | Admitting: Oncology

## 2015-07-04 ENCOUNTER — Ambulatory Visit (HOSPITAL_BASED_OUTPATIENT_CLINIC_OR_DEPARTMENT_OTHER): Payer: Medicare Other

## 2015-07-04 VITALS — BP 107/76 | HR 64 | Temp 97.3°F | Resp 17

## 2015-07-04 DIAGNOSIS — D649 Anemia, unspecified: Secondary | ICD-10-CM

## 2015-07-04 DIAGNOSIS — D630 Anemia in neoplastic disease: Secondary | ICD-10-CM

## 2015-07-04 LAB — CBC WITH DIFFERENTIAL/PLATELET
BASO%: 1.2 % (ref 0.0–2.0)
Basophils Absolute: 0.1 10*3/uL (ref 0.0–0.1)
EOS%: 1 % (ref 0.0–7.0)
Eosinophils Absolute: 0.1 10*3/uL (ref 0.0–0.5)
HEMATOCRIT: 24.1 % — AB (ref 38.4–49.9)
HGB: 7.5 g/dL — ABNORMAL LOW (ref 13.0–17.1)
LYMPH%: 11.1 % — AB (ref 14.0–49.0)
MCH: 28.3 pg (ref 27.2–33.4)
MCHC: 31.1 g/dL — AB (ref 32.0–36.0)
MCV: 90.9 fL (ref 79.3–98.0)
MONO#: 1 10*3/uL — AB (ref 0.1–0.9)
MONO%: 8.9 % (ref 0.0–14.0)
NEUT%: 77.8 % — ABNORMAL HIGH (ref 39.0–75.0)
NEUTROS ABS: 8.8 10*3/uL — AB (ref 1.5–6.5)
Platelets: 182 10*3/uL (ref 140–400)
RBC: 2.65 10*6/uL — AB (ref 4.20–5.82)
RDW: 20 % — AB (ref 11.0–14.6)
WBC: 11.3 10*3/uL — AB (ref 4.0–10.3)
lymph#: 1.3 10*3/uL (ref 0.9–3.3)
nRBC: 9 % — ABNORMAL HIGH (ref 0–0)

## 2015-07-04 LAB — TECHNOLOGIST REVIEW

## 2015-07-04 LAB — PREPARE RBC (CROSSMATCH)

## 2015-07-04 MED ORDER — DIPHENHYDRAMINE HCL 25 MG PO CAPS
ORAL_CAPSULE | ORAL | Status: AC
  Filled 2015-07-04: qty 1

## 2015-07-04 MED ORDER — ACETAMINOPHEN 325 MG PO TABS
ORAL_TABLET | ORAL | Status: AC
  Filled 2015-07-04: qty 2

## 2015-07-04 MED ORDER — ACETAMINOPHEN 325 MG PO TABS
650.0000 mg | ORAL_TABLET | Freq: Once | ORAL | Status: AC
  Administered 2015-07-04: 650 mg via ORAL

## 2015-07-04 MED ORDER — SODIUM CHLORIDE 0.9 % IV SOLN
250.0000 mL | Freq: Once | INTRAVENOUS | Status: AC
  Administered 2015-07-04: 250 mL via INTRAVENOUS

## 2015-07-04 MED ORDER — DIPHENHYDRAMINE HCL 25 MG PO CAPS
25.0000 mg | ORAL_CAPSULE | Freq: Once | ORAL | Status: AC
  Administered 2015-07-04: 25 mg via ORAL

## 2015-07-04 NOTE — Patient Instructions (Signed)

## 2015-07-05 DIAGNOSIS — D649 Anemia, unspecified: Secondary | ICD-10-CM | POA: Diagnosis not present

## 2015-07-05 LAB — TYPE AND SCREEN
ABO/RH(D): O POS
Antibody Screen: NEGATIVE
UNIT DIVISION: 0
Unit division: 0

## 2015-07-12 ENCOUNTER — Encounter: Payer: Self-pay | Admitting: Skilled Nursing Facility1

## 2015-07-12 NOTE — Progress Notes (Signed)
Subjective:     Patient ID: Marcus [name], male   DOB: [birth year], 70 y.o.   MRN: [id number]  HPI   Review of Systems     Objective:   Physical Exam To assist the pt in identifying dietary strategies to gain lost wt.    Assessment:     Pt identified as being malnourished due to lost wt. Pt contacted via the telephone at [phone]. Pts wife answered the phone and states he is fine. Pt states her husband has lost wt but he is trying to eat and drinks ensures throughout the day.     Plan:     No plan identified a this time.

## 2015-07-19 ENCOUNTER — Telehealth: Payer: Self-pay | Admitting: Oncology

## 2015-07-19 ENCOUNTER — Other Ambulatory Visit (HOSPITAL_BASED_OUTPATIENT_CLINIC_OR_DEPARTMENT_OTHER): Payer: Medicare Other

## 2015-07-19 ENCOUNTER — Ambulatory Visit (HOSPITAL_BASED_OUTPATIENT_CLINIC_OR_DEPARTMENT_OTHER): Payer: Medicare Other | Admitting: Oncology

## 2015-07-19 ENCOUNTER — Ambulatory Visit (HOSPITAL_BASED_OUTPATIENT_CLINIC_OR_DEPARTMENT_OTHER): Payer: Medicare Other

## 2015-07-19 VITALS — BP 101/66 | HR 83 | Temp 98.1°F | Resp 18 | Ht 74.0 in | Wt 169.2 lb

## 2015-07-19 DIAGNOSIS — C61 Malignant neoplasm of prostate: Secondary | ICD-10-CM

## 2015-07-19 DIAGNOSIS — D630 Anemia in neoplastic disease: Secondary | ICD-10-CM | POA: Diagnosis not present

## 2015-07-19 DIAGNOSIS — M25511 Pain in right shoulder: Secondary | ICD-10-CM | POA: Diagnosis not present

## 2015-07-19 DIAGNOSIS — C7951 Secondary malignant neoplasm of bone: Secondary | ICD-10-CM

## 2015-07-19 DIAGNOSIS — E291 Testicular hypofunction: Secondary | ICD-10-CM

## 2015-07-19 DIAGNOSIS — M898X9 Other specified disorders of bone, unspecified site: Secondary | ICD-10-CM

## 2015-07-19 DIAGNOSIS — K5900 Constipation, unspecified: Secondary | ICD-10-CM

## 2015-07-19 LAB — COMPREHENSIVE METABOLIC PANEL
ALT: 61 U/L — AB (ref 0–55)
AST: 70 U/L — AB (ref 5–34)
Albumin: 2.3 g/dL — ABNORMAL LOW (ref 3.5–5.0)
Alkaline Phosphatase: 318 U/L — ABNORMAL HIGH (ref 40–150)
Anion Gap: 10 mEq/L (ref 3–11)
BUN: 12.4 mg/dL (ref 7.0–26.0)
CALCIUM: 10.5 mg/dL — AB (ref 8.4–10.4)
CHLORIDE: 103 meq/L (ref 98–109)
CO2: 24 mEq/L (ref 22–29)
CREATININE: 0.9 mg/dL (ref 0.7–1.3)
EGFR: >90 mL/min/{1.73_m2} (ref >90)
GLUCOSE: 95 mg/dL (ref 70–140)
Potassium: 3.9 mEq/L (ref 3.5–5.1)
SODIUM: 137 meq/L (ref 136–145)
Total Bilirubin: 0.59 mg/dL (ref 0.20–1.20)
Total Protein: 7.3 g/dL (ref 6.4–8.3)

## 2015-07-19 LAB — CBC WITH DIFFERENTIAL/PLATELET
BASO%: 1.4 % (ref 0.0–2.0)
Basophils Absolute: 0.2 10*3/uL — ABNORMAL HIGH (ref 0.0–0.1)
EOS%: 1.1 % (ref 0.0–7.0)
Eosinophils Absolute: 0.2 10*3/uL (ref 0.0–0.5)
HEMATOCRIT: 28.4 % — AB (ref 38.4–49.9)
HEMOGLOBIN: 9.1 g/dL — AB (ref 13.0–17.1)
LYMPH#: 1.1 10*3/uL (ref 0.9–3.3)
LYMPH%: 7.7 % — AB (ref 14.0–49.0)
MCH: 28.5 pg (ref 27.2–33.4)
MCHC: 32 g/dL (ref 32.0–36.0)
MCV: 89 fL (ref 79.3–98.0)
MONO#: 1 10*3/uL — AB (ref 0.1–0.9)
MONO%: 6.9 % (ref 0.0–14.0)
NEUT%: 82.9 % — ABNORMAL HIGH (ref 39.0–75.0)
NEUTROS ABS: 11.5 10*3/uL — AB (ref 1.5–6.5)
Platelets: 96 10*3/uL — ABNORMAL LOW (ref 140–400)
RBC: 3.19 10*6/uL — ABNORMAL LOW (ref 4.20–5.82)
RDW: 19 % — ABNORMAL HIGH (ref 11.0–14.6)
WBC: 13.9 10*3/uL — AB (ref 4.0–10.3)
nRBC: 3 % — ABNORMAL HIGH (ref 0–0)

## 2015-07-19 LAB — TECHNOLOGIST REVIEW: Technologist Review: 12

## 2015-07-19 MED ORDER — DENOSUMAB 120 MG/1.7ML ~~LOC~~ SOLN
120.0000 mg | Freq: Once | SUBCUTANEOUS | Status: AC
  Administered 2015-07-19: 120 mg via SUBCUTANEOUS
  Filled 2015-07-19: qty 1.7

## 2015-07-19 NOTE — Telephone Encounter (Signed)
per pof to sch pt appt-gave pt copy of avs °

## 2015-07-19 NOTE — Patient Instructions (Signed)
Denosumab injection What is this medicine? DENOSUMAB (den oh sue mab) slows bone breakdown. Prolia is used to treat osteoporosis in women after menopause and in men. Xgeva is used to prevent bone fractures and other bone problems caused by cancer bone metastases. Xgeva is also used to treat giant cell tumor of the bone. This medicine may be used for other purposes; ask your health care provider or pharmacist if you have questions. COMMON BRAND NAME(S): Prolia, XGEVA What should I tell my health care provider before I take this medicine? They need to know if you have any of these conditions: -dental disease -eczema -infection or history of infections -kidney disease or on dialysis -low blood calcium or vitamin D -malabsorption syndrome -scheduled to have surgery or tooth extraction -taking medicine that contains denosumab -thyroid or parathyroid disease -an unusual reaction to denosumab, other medicines, foods, dyes, or preservatives -pregnant or trying to get pregnant -breast-feeding How should I use this medicine? This medicine is for injection under the skin. It is given by a health care professional in a hospital or clinic setting. If you are getting Prolia, a special MedGuide will be given to you by the pharmacist with each prescription and refill. Be sure to read this information carefully each time. For Prolia, talk to your pediatrician regarding the use of this medicine in children. Special care may be needed. For Xgeva, talk to your pediatrician regarding the use of this medicine in children. While this drug may be prescribed for children as young as 13 years for selected conditions, precautions do apply. Overdosage: If you think you've taken too much of this medicine contact a poison control center or emergency room at once. Overdosage: If you think you have taken too much of this medicine contact a poison control center or emergency room at once. NOTE: This medicine is only for  you. Do not share this medicine with others. What if I miss a dose? It is important not to miss your dose. Call your doctor or health care professional if you are unable to keep an appointment. What may interact with this medicine? Do not take this medicine with any of the following medications: -other medicines containing denosumab This medicine may also interact with the following medications: -medicines that suppress the immune system -medicines that treat cancer -steroid medicines like prednisone or cortisone This list may not describe all possible interactions. Give your health care provider a list of all the medicines, herbs, non-prescription drugs, or dietary supplements you use. Also tell them if you smoke, drink alcohol, or use illegal drugs. Some items may interact with your medicine. What should I watch for while using this medicine? Visit your doctor or health care professional for regular checks on your progress. Your doctor or health care professional may order blood tests and other tests to see how you are doing. Call your doctor or health care professional if you get a cold or other infection while receiving this medicine. Do not treat yourself. This medicine may decrease your body's ability to fight infection. You should make sure you get enough calcium and vitamin D while you are taking this medicine, unless your doctor tells you not to. Discuss the foods you eat and the vitamins you take with your health care professional. See your dentist regularly. Brush and floss your teeth as directed. Before you have any dental work done, tell your dentist you are receiving this medicine. Do not become pregnant while taking this medicine or for 5 months after stopping   it. Women should inform their doctor if they wish to become pregnant or think they might be pregnant. There is a potential for serious side effects to an unborn child. Talk to your health care professional or pharmacist for more  information. What side effects may I notice from receiving this medicine? Side effects that you should report to your doctor or health care professional as soon as possible: -allergic reactions like skin rash, itching or hives, swelling of the face, lips, or tongue -breathing problems -chest pain -fast, irregular heartbeat -feeling faint or lightheaded, falls -fever, chills, or any other sign of infection -muscle spasms, tightening, or twitches -numbness or tingling -skin blisters or bumps, or is dry, peels, or red -slow healing or unexplained pain in the mouth or jaw -unusual bleeding or bruising Side effects that usually do not require medical attention (Report these to your doctor or health care professional if they continue or are bothersome.): -muscle pain -stomach upset, gas This list may not describe all possible side effects. Call your doctor for medical advice about side effects. You may report side effects to FDA at 1-800-FDA-1088. Where should I keep my medicine? This medicine is only given in a clinic, doctor's office, or other health care setting and will not be stored at home. NOTE: This sheet is a summary. It may not cover all possible information. If you have questions about this medicine, talk to your doctor, pharmacist, or health care provider.  2015, [employer]vier/Gold Standard. (2011-07-08 12:37:47)  

## 2015-07-19 NOTE — Progress Notes (Signed)
Hematology and Oncology Follow Up Visit  Marcus [name] [id number] [birth year] 70 y.o. 07/19/2015 1:06 PM   Principle Diagnosis: 70 year old gentleman diagnosed with prostate cancer in 2005. 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   Prior Therapy:  He is status post prostatectomy and subsequently was treated with salvage radiation therapy in 2007.  In February of 2013 his PSA was up to 22.5 and was started on androgen deprivation.  He developed castration resistant disease with documented bony metastasis despite combined androgen depravation with Lupron and Casodex. His last bone scan done on 09/18/2012 which showed progression of disease. Zytiga a total of 1000 mg started in August of 2014 for the PSA of 32.44. He was discontinued in November 2015 due to a rise in his PSA Xtandi 160 mg daily started in November 2015. Stopped on 05/13/2014 due to progression of disease.  Xofigo under the care of Dr. Tammi [name]. First treatment given 06/09/2014. He is S/P 6 out of 6 completed on 11/17/2014.  Current therapy:  He is continued on Lupron at [facility] urology. His Xgeva to be given and the [place] since 06/2013.  He have refused systemic chemotherapy and will continue with supportive care only.  Interim History:  Mr. Marcus [name] presents today for a followup visit with his wife. Since his last visit, he reports continuous decline in his clinical status. His appetite is decreased and have lost more weight and his performance status has declined as well. He reports less activity and confined to chair and his house. His pain has increased periodically and have been using more breakthrough pain medication. He is on morphine twice a day which she takes regularly. He still ambulating without any major difficulties and have not had any falls or syncope.  He report any headaches, blurry vision, syncope or seizures. He does not report any abdominal pain or distention.  He does not report any hematochezia or bleeding per rectum. He does not report any nausea or vomiting. Did not report any urinary symptoms of hematuria dysuria or incontinence. He has not reported any chest pain or difficulty breathing. Has not reported any cough or hemoptysis or wheezing. He did not report any skin changes or petechiae. Has not reported any lymphadenopathy. Remainder of his review of system is unremarkable.  Medications: I have reviewed the patient's current medications.  Current Outpatient Prescriptions  Medication Sig Dispense Refill  . Albuterol Sulfate (PROAIR RESPICLICK) 123XX123 (90 BASE) MCG/ACT AEPB Inhale 2 puffs into the lungs 4 (four) times daily as needed. 1 each 1  . cholecalciferol (VITAMIN D) 1000 UNITS tablet Take 1,000 Units by mouth daily.    Marland Kitchen lisinopril-hydrochlorothiazide (PRINZIDE,ZESTORETIC) 20-25 MG tablet TAKE ONE TABLET BY MOUTH ONCE DAILY 90 tablet 1  . megestrol (MEGACE) 40 MG/ML suspension TAKE TEN ML BY MOUTH ONCE DAILY 480 mL 2  . morphine (MS CONTIN) 30 MG 12 hr tablet Take 1 tablet (30 mg total) by mouth every 12 (twelve) hours. 60 tablet 0  . oxyCODONE-acetaminophen (PERCOCET/ROXICET) 5-325 MG tablet Take 1-2 tablets by mouth every 4 (four) hours as needed for severe pain (Do not take more than 12 tablets per 24 hours.). 60 tablet 0  . prochlorperazine (COMPAZINE) 10 MG tablet TAKE ONE TABLET BY MOUTH EVERY 6 HOURS AS NEEDED FOR NAUSEA AND  VOMITTING 60 tablet 1  . senna-docusate (SENNA S) 8.6-50 MG per tablet Take 1 tablet by mouth at bedtime. 30 tablet 3  . XOFIGO 27 MCCI/ML SOLN Inject 50  KBq/kg into the vein every 30 (thirty) days. For 6 monthly infusions in nuclear medicine 6 each 0   No current facility-administered medications for this visit.   Facility-Administered Medications Ordered in Other Visits  Medication Dose Route Frequency Provider Last Rate Last Dose  . denosumab (XGEVA) injection 120 mg  120 mg Subcutaneous Once Marcus Portela, MD          Allergies: No Known Allergies   Physical Exam: Blood pressure 101/66, pulse 83, temperature 98.1 F (36.7 C), temperature source Oral, resp. rate 18, height 6\' 2"  (1.88 m), weight 169 lb 3.2 oz (76.749 kg), SpO2 100 %. ECOG: 2 General appearance: Pleasant-appearing gentleman chronically ill. Head: Normocephalic, without obvious abnormality no oral thrush noted. Neck: no adenopathy and No masses or thyromegaly. Lymph nodes: Cervical, supraclavicular, and axillary nodes normal. Heart:regular rate and rhythm, S1, S2. No murmurs rubs or gallops. Lung:chest clear, no wheezing, rales. No dullness to percussion. Abdomen: soft, non-tender, without masses or organomegaly no shifting dullness or ascites. EXT:no erythema, induration, no edema noted. Neurological: No deficits.    Lab Results: Lab Results  Component Value Date   WBC 13.9* 07/19/2015   HGB 9.1* 07/19/2015   HCT 28.4* 07/19/2015   MCV 89.0 07/19/2015   PLT 96* 07/19/2015     Chemistry      Component Value Date/Time   NA 137 07/19/2015 1205   NA 135 11/08/2014 0001   K 3.9 07/19/2015 1205   K 4.0 11/08/2014 0001   CL 101 11/08/2014 0001   CO2 24 07/19/2015 1205   CO2 26 11/08/2014 0001   BUN 12.4 07/19/2015 1205   BUN 16 11/08/2014 0001   CREATININE 0.9 07/19/2015 1205   CREATININE 1.09 11/08/2014 0001      Component Value Date/Time   CALCIUM 10.5* 07/19/2015 1205   CALCIUM 9.1 11/08/2014 0001   ALKPHOS 318* 07/19/2015 1205   ALKPHOS 75 11/08/2014 0001   AST 70* 07/19/2015 1205   AST 17 11/08/2014 0001   ALT 61* 07/19/2015 1205   ALT 16 11/08/2014 0001   BILITOT 0.59 07/19/2015 1205   BILITOT 0.6 11/08/2014 0001        Results for Marcus, [name] (MRN [id number]) as of 07/19/2015 12:49  Ref. Range 05/25/2015 08:48 06/22/2015 11:28  PSA Latest Ref Range: 0.0-4.0 ng/mL 241.4 (H) 377.1 (H)        Impression and Plan:  70 year old gentleman with the following issues:  1. Castration resistant  prostate cancer with metastatic disease to the bone. He has developed castration resistant disease and have progressed on both Zytiga and most recently on xtandi. His most recent PSA was up to 209.  He is status post 6 treatments with Xofigo under the care of Dr. Tammi [name] with some temporary response in his PSA.  His PSA continues to rise and most recently up to 377. His clinical status is indicated progressive prostate cancer. He has declined systemic chemotherapy in the past and at this point, he is not a candidate for it in any case. We have recommended continue supportive care only and potentially hospice enrollment in the future.  2. Androgen depravation: Lupron will be administered if his testosterone increases. Testosterone remains castrate at less than 9 on 06/22/2015.  3. Bone directed therapy: He is on Xgeva monthly which he as tolerated it well. I continue to educate him about complications related to this medication including hypocalcemia and osteonecrosis of the jaw. He will receive his injection today and every other month.  We'll have a low threshold of discontinuing this medication altogether in the future.  4.Right shoulder pain/diffuse bone pain: His pain is better at this time with long-acting morphine and breakthrough Percocet. These will be continued without changes.   5. Constipation: MiraLax in the morning and continue Senokot S which have helped his symptoms when he takes it.  6. Anemia:  He has anemia of chronic disease, malignancy also related to [facility]. Hemoglobin has improved with transfusion and does not require any packed red cells at this time.  7. Prognosis: He has an incurable malignancy and likely limited life expectancy. I continue to remind him about the option of hospice enrollment if he declines further. This was addressed again today and his family declined. They are aware that his health will continue to decline and will require hospice in the immediate  future.  8. Nutrition and diet: This was discussed today and different strategies to boost his nutritional status were reviewed. He is to increase nutritional supplements to 3 times a day.   9. Followup: Will be in about 4 weeks.   [facility] MD 6/28/20171:06 PM

## 2015-07-20 LAB — PSA: PROSTATE SPECIFIC AG, SERUM: 689.8 ng/mL — AB (ref 0.0–4.0)

## 2015-07-26 ENCOUNTER — Encounter: Payer: Self-pay | Admitting: *Deleted

## 2015-07-28 ENCOUNTER — Telehealth: Payer: Self-pay | Admitting: *Deleted

## 2015-07-28 ENCOUNTER — Other Ambulatory Visit: Payer: Self-pay | Admitting: *Deleted

## 2015-07-28 DIAGNOSIS — C61 Malignant neoplasm of prostate: Secondary | ICD-10-CM

## 2015-07-28 DIAGNOSIS — C7951 Secondary malignant neoplasm of bone: Secondary | ICD-10-CM

## 2015-07-28 MED ORDER — MORPHINE SULFATE ER 30 MG PO TBCR: 30.0000 mg | EXTENDED_RELEASE_TABLET | Freq: Two times a day (BID) | ORAL | Status: DC

## 2015-07-28 MED ORDER — OXYCODONE-ACETAMINOPHEN 5-325 MG PO TABS: 1.0000 | ORAL_TABLET | ORAL | Status: DC | PRN

## 2015-07-28 NOTE — Telephone Encounter (Signed)
Patient's wife calling to request refills on oxycodone and ms contin, signed scripts left at front for patient p/u.

## 2015-07-28 NOTE — Telephone Encounter (Signed)
Patient's wife Marcus [name] called requesting refill for Morphione and Oxycodone.  Return number when ready for pick up is [phone].

## 2015-08-01 ENCOUNTER — Other Ambulatory Visit: Payer: Self-pay

## 2015-08-01 ENCOUNTER — Inpatient Hospital Stay (HOSPITAL_COMMUNITY)
Admission: EM | Admit: 2015-08-01 | Discharge: 2015-08-03 | DRG: 948 | Disposition: A | Discharge status: Hospice | Payer: Medicare Other | Attending: Internal Medicine | Admitting: Internal Medicine

## 2015-08-01 ENCOUNTER — Emergency Department (HOSPITAL_COMMUNITY): Payer: Medicare Other

## 2015-08-01 ENCOUNTER — Encounter (HOSPITAL_COMMUNITY): Payer: Self-pay | Admitting: Emergency Medicine

## 2015-08-01 DIAGNOSIS — R52 Pain, unspecified: Secondary | ICD-10-CM | POA: Diagnosis not present

## 2015-08-01 DIAGNOSIS — M25512 Pain in left shoulder: Secondary | ICD-10-CM | POA: Diagnosis not present

## 2015-08-01 DIAGNOSIS — C61 Malignant neoplasm of prostate: Secondary | ICD-10-CM | POA: Diagnosis not present

## 2015-08-01 DIAGNOSIS — Z79891 Long term (current) use of opiate analgesic: Secondary | ICD-10-CM | POA: Diagnosis not present

## 2015-08-01 DIAGNOSIS — J45909 Unspecified asthma, uncomplicated: Secondary | ICD-10-CM | POA: Diagnosis not present

## 2015-08-01 DIAGNOSIS — I10 Essential (primary) hypertension: Secondary | ICD-10-CM | POA: Diagnosis not present

## 2015-08-01 DIAGNOSIS — Z7189 Other specified counseling: Secondary | ICD-10-CM | POA: Diagnosis not present

## 2015-08-01 DIAGNOSIS — Z9079 Acquired absence of other genital organ(s): Secondary | ICD-10-CM

## 2015-08-01 DIAGNOSIS — Z66 Do not resuscitate: Secondary | ICD-10-CM | POA: Diagnosis not present

## 2015-08-01 DIAGNOSIS — Z8042 Family history of malignant neoplasm of prostate: Secondary | ICD-10-CM

## 2015-08-01 DIAGNOSIS — Z515 Encounter for palliative care: Secondary | ICD-10-CM | POA: Insufficient documentation

## 2015-08-01 DIAGNOSIS — M6250 Muscle wasting and atrophy, not elsewhere classified, unspecified site: Secondary | ICD-10-CM | POA: Diagnosis present

## 2015-08-01 DIAGNOSIS — C7951 Secondary malignant neoplasm of bone: Secondary | ICD-10-CM | POA: Diagnosis not present

## 2015-08-01 DIAGNOSIS — G893 Neoplasm related pain (acute) (chronic): Secondary | ICD-10-CM | POA: Diagnosis not present

## 2015-08-01 DIAGNOSIS — R079 Chest pain, unspecified: Secondary | ICD-10-CM

## 2015-08-01 DIAGNOSIS — Z79899 Other long term (current) drug therapy: Secondary | ICD-10-CM | POA: Diagnosis not present

## 2015-08-01 DIAGNOSIS — R451 Restlessness and agitation: Secondary | ICD-10-CM | POA: Diagnosis not present

## 2015-08-01 DIAGNOSIS — M25511 Pain in right shoulder: Secondary | ICD-10-CM | POA: Diagnosis not present

## 2015-08-01 LAB — I-STAT CHEM 8, ED
BUN: 23 mg/dL — AB (ref 6–20)
CREATININE: 1.1 mg/dL (ref 0.61–1.24)
Calcium, Ion: 1.28 mmol/L — ABNORMAL HIGH (ref 1.12–1.23)
Chloride: 97 mmol/L — ABNORMAL LOW (ref 101–111)
Glucose, Bld: 119 mg/dL — ABNORMAL HIGH (ref 65–99)
HEMATOCRIT: 24 % — AB (ref 39.0–52.0)
Hemoglobin: 8.2 g/dL — ABNORMAL LOW (ref 13.0–17.0)
POTASSIUM: 4.1 mmol/L (ref 3.5–5.1)
SODIUM: 134 mmol/L — AB (ref 135–145)
TCO2: 27 mmol/L (ref 0–100)

## 2015-08-01 LAB — I-STAT TROPONIN, ED: Troponin i, poc: 0.05 ng/mL (ref 0.00–0.08)

## 2015-08-01 MED ORDER — SODIUM CHLORIDE 0.9 % IV BOLUS (SEPSIS)
500.0000 mL | Freq: Once | INTRAVENOUS | Status: AC
  Administered 2015-08-01: 500 mL via INTRAVENOUS

## 2015-08-01 MED ORDER — HYDROMORPHONE HCL 1 MG/ML IJ SOLN
1.0000 mg | INTRAMUSCULAR | Status: DC | PRN
  Administered 2015-08-01: 1 mg via INTRAVENOUS
  Filled 2015-08-01: qty 1

## 2015-08-01 MED ORDER — ONDANSETRON HCL 4 MG/2ML IJ SOLN
4.0000 mg | Freq: Once | INTRAMUSCULAR | Status: AC
  Administered 2015-08-01: 4 mg via INTRAVENOUS
  Filled 2015-08-01: qty 2

## 2015-08-01 MED ORDER — HYDROMORPHONE HCL 1 MG/ML IJ SOLN
0.5000 mg | Freq: Once | INTRAMUSCULAR | Status: AC
  Administered 2015-08-01: 0.5 mg via INTRAVENOUS
  Filled 2015-08-01: qty 1

## 2015-08-01 NOTE — ED Notes (Signed)
Patient transported to X-ray 

## 2015-08-01 NOTE — ED Notes (Signed)
Bed: WA08 Expected date:  Expected time:  Means of arrival:  Comments: [employer] [facility]  Ca pt, pain

## 2015-08-01 NOTE — ED Notes (Signed)
Pt presents from home w/ a hx of prostate cancer that has metastasized to his bones. Takes oxycodone and morphine for pain but is not taking any chemo or radiation at this time. Pain worse today all over. Took 10mg  oxycodone at 1830 with 30mg  of morphine. Alert and oriented.

## 2015-08-01 NOTE — ED Provider Notes (Signed)
Emergency Department Provider Note  Time seen: Approximately 10:56 PM  I have reviewed the triage vital signs and the nursing notes.   HISTORY  Chief Complaint Cancer and Generalized Body Aches   HPI Marcus [name] is a 70 y.o. male with past medical history of prostate cancer with known bony metastasis presents to the emergency department for evaluation of diffuse body pain worse in his shoulders and and chest. He has had similar pain related to his cancer in the past but the pain today began suddenly at approximately 3 PM. He has been taking his home medications including oxycodone and oral morphine with no relief. No associated dyspnea, productive cough, hemoptysis, or abdominal pain. No prior history of ACS or PE. Patient's goals of care including pain control. He is not on chemotherapy or radiation.    Past Medical History  Diagnosis Date  . Asthma   . Allergy     RHINITIS  . Hypertension   . Calcium oxalate renal stones   . Dyslipidemia   . Cancer ([place]) 2005    PROSTATE  . Prostate cancer ([place])     castration resistant prostate cancer with metastatic disease to the bone     Patient Active Problem List   Diagnosis Date Noted  . Absolute anemia 12/26/2014  . Prostate cancer ([place]) 11/30/2010  . Hypertension 11/30/2010  . Hyperlipidemia LDL goal < 100 11/30/2010  . Allergic rhinitis, mild 11/30/2010  . History of asthma 11/30/2010    Past Surgical History  Procedure Laterality Date  . Prostatectomy  06/2003    Current Outpatient Rx  Name  Route  Sig  Dispense  Refill  . lisinopril-hydrochlorothiazide (PRINZIDE,ZESTORETIC) 20-25 MG tablet      TAKE ONE TABLET BY MOUTH ONCE DAILY   90 tablet   1   . megestrol (MEGACE) 40 MG/ML suspension      TAKE TEN ML BY MOUTH ONCE DAILY Patient taking differently: Take 400 mg by mouth daily as needed (appetite).    480 mL   2   . morphine (MS CONTIN) 30 MG 12 hr tablet   Oral   Take 1 tablet (30 mg total) by  mouth every 12 (twelve) hours.   60 tablet   0   . oxyCODONE-acetaminophen (PERCOCET/ROXICET) 5-325 MG tablet   Oral   Take 1-2 tablets by mouth every 4 (four) hours as needed for severe pain (Do not take more than 12 tablets per 24 hours.).   60 tablet   0   . prochlorperazine (COMPAZINE) 10 MG tablet      TAKE ONE TABLET BY MOUTH EVERY 6 HOURS AS NEEDED FOR NAUSEA AND  VOMITTING Patient taking differently: Take 10 mg by mouth every 6 (six) hours as needed for nausea or vomiting.    60 tablet   1   . senna-docusate (SENNA S) 8.6-50 MG per tablet   Oral   Take 1 tablet by mouth at bedtime.   30 tablet   3   . XOFIGO 27 MCCI/ML SOLN   Intravenous   Inject 50 KBq/kg into the vein every 30 (thirty) days. For 6 monthly infusions in nuclear medicine   6 each   0     Dispense as written.   . Albuterol Sulfate (PROAIR RESPICLICK) 123XX123 (90 BASE) MCG/ACT AEPB   Inhalation   Inhale 2 puffs into the lungs 4 (four) times daily as needed. Patient not taking: Reported on 08/01/2015   1 each   1  Allergies Review of patient's allergies indicates no known allergies.  Family History  Problem Relation Age of Onset  . Cancer Father     prostate    Social History Social History  Substance Use Topics  . Smoking status: Never Smoker   . Smokeless tobacco: Never Used  . Alcohol Use: No    Review of Systems  Constitutional: No fever/chills Eyes: No visual changes. ENT: No sore throat. Cardiovascular: Positive chest pain. Respiratory: Denies shortness of breath. Gastrointestinal: No abdominal pain.  No nausea, no vomiting.  No diarrhea.  No constipation. Genitourinary: Negative for dysuria. Musculoskeletal: Negative for back pain. Skin: Negative for rash. Neurological: Negative for headaches, focal weakness or numbness.  10-point ROS otherwise negative.  ____________________________________________   PHYSICAL EXAM:  VITAL SIGNS: ED Triage Vitals  Enc Vitals  Group     BP 08/01/15 2146 121/80 mmHg     Pulse Rate 08/01/15 2146 88     Resp 08/01/15 2146 15     Temp 08/01/15 2146 98.4 F (36.9 C)     Temp Source 08/01/15 2146 Oral     SpO2 08/01/15 2146 98 %     Weight 08/01/15 2146 169 lb (76.658 kg)     Height 08/01/15 2146 6\' 2"  (1.88 m)     Pain Score 08/01/15 2143 10   Constitutional: Alert and oriented. Well appearing and in no acute distress. Eyes: Conjunctivae are normal. PERRL. EOMI. Head: Atraumatic. Nose: No congestion/rhinnorhea. Mouth/Throat: Mucous membranes are moist.  Oropharynx non-erythematous. Neck: No stridor. Cardiovascular: Normal rate, regular rhythm. Good peripheral circulation. Grossly normal heart sounds.   Respiratory: Normal respiratory effort.  No retractions. Lungs CTAB. Gastrointestinal: Soft and nontender. No distention.  Musculoskeletal: No lower extremity tenderness nor edema. No gross deformities of extremities. Neurologic:  Normal speech and language. No gross focal neurologic deficits are appreciated.  Skin:  Skin is warm, dry and intact. No rash noted. Psychiatric: Mood and affect are normal. Speech and behavior are normal.  ____________________________________________   LABS (all labs ordered are listed, but only abnormal results are displayed)  Labs Reviewed  I-STAT CHEM 8, ED - Abnormal; Notable for the following:    Sodium 134 (*)    Chloride 97 (*)    BUN 23 (*)    Glucose, Bld 119 (*)    Calcium, Ion 1.28 (*)    Hemoglobin 8.2 (*)    HCT 24.0 (*)    All other components within normal limits  I-STAT TROPOININ, ED   ____________________________________________  EKG  Reviewed in MUSE ____________________________________________  RADIOLOGY  Dg Chest 2 View  08/01/2015  CLINICAL DATA:  Chest pain. History of prostate cancer metastatic to bone. EXAM: CHEST  2 VIEW COMPARISON:  11/08/2014 FINDINGS: Shallow inspiration with linear atelectasis in the lung bases. Heart size and pulmonary  vascularity are normal. No focal airspace disease or consolidation in the lungs. No blunting of costophrenic angles. No pneumothorax. Mediastinal contours appear intact. Diffuse bone sclerosis consistent with diffuse metastatic disease. IMPRESSION: Shallow inspiration with atelectasis in the lung bases. Diffuse bone metastases. Electronically Signed   By: Lucienne Capers M.D.   On: 08/01/2015 22:56    ____________________________________________   PROCEDURES  Procedure(s) performed:   Procedures  None ____________________________________________   INITIAL IMPRESSION / ASSESSMENT AND PLAN / ED COURSE  Pertinent labs & imaging results that were available during my care of the patient were reviewed by me and considered in my medical decision making (see chart for details).  Patient presents to  the emergency department for evaluation of generalized pain focused in his chest. He has bilateral shoulder and chest discomfort. Patient has prostate cancer with known bony metastasis. He has exhausted his home pain medications but finds himself unable to control pain symptoms. Low suspicion for ACS or PE. No fevers, chills, productive cough. Plan for chest x-ray, labs, IVF, pain control. Will perform initial evaluation and likely admit for pain control.   11:15 PM Patient with improved pain after IV dilaudid but continues to have moderate to severe pain. Labs and CXR were reviewed. Troponin negative. Plan for pain control admission, IVF, and biomarker trending.   Discussed case with hospitalist who will admit.   ____________________________________________  FINAL CLINICAL IMPRESSION(S) / ED DIAGNOSES  Final diagnoses:  Chest pain, unspecified chest pain type  Prostate cancer [facility])  Essential hypertension     MEDICATIONS GIVEN DURING THIS VISIT:  Medications  HYDROmorphone (DILAUDID) injection 1 mg (1 mg Intravenous Given 08/01/15 2236)  HYDROmorphone (DILAUDID) injection 0.5 mg (not  administered)  sodium chloride 0.9 % bolus 500 mL (500 mLs Intravenous New Bag/Given 08/01/15 2235)  ondansetron (ZOFRAN) injection 4 mg (4 mg Intravenous Given 08/01/15 2238)     NEW OUTPATIENT MEDICATIONS STARTED DURING THIS VISIT:  None   Note:  This document was prepared using Dragon voice recognition software and may include unintentional dictation errors.  Nanda Quinton, MD Emergency Medicine  Margette Fast, MD 08/02/15 1053

## 2015-08-02 ENCOUNTER — Encounter (HOSPITAL_COMMUNITY): Payer: Self-pay | Admitting: *Deleted

## 2015-08-02 DIAGNOSIS — Z515 Encounter for palliative care: Secondary | ICD-10-CM | POA: Diagnosis not present

## 2015-08-02 DIAGNOSIS — G893 Neoplasm related pain (acute) (chronic): Secondary | ICD-10-CM | POA: Diagnosis present

## 2015-08-02 DIAGNOSIS — R52 Pain, unspecified: Secondary | ICD-10-CM | POA: Diagnosis present

## 2015-08-02 DIAGNOSIS — I10 Essential (primary) hypertension: Secondary | ICD-10-CM | POA: Diagnosis present

## 2015-08-02 DIAGNOSIS — G894 Chronic pain syndrome: Secondary | ICD-10-CM | POA: Diagnosis not present

## 2015-08-02 DIAGNOSIS — J45909 Unspecified asthma, uncomplicated: Secondary | ICD-10-CM | POA: Diagnosis present

## 2015-08-02 DIAGNOSIS — Z9079 Acquired absence of other genital organ(s): Secondary | ICD-10-CM | POA: Diagnosis not present

## 2015-08-02 DIAGNOSIS — C801 Malignant (primary) neoplasm, unspecified: Secondary | ICD-10-CM | POA: Diagnosis not present

## 2015-08-02 DIAGNOSIS — Z79899 Other long term (current) drug therapy: Secondary | ICD-10-CM | POA: Diagnosis not present

## 2015-08-02 DIAGNOSIS — Z7189 Other specified counseling: Secondary | ICD-10-CM

## 2015-08-02 DIAGNOSIS — R079 Chest pain, unspecified: Secondary | ICD-10-CM | POA: Diagnosis not present

## 2015-08-02 DIAGNOSIS — C7951 Secondary malignant neoplasm of bone: Secondary | ICD-10-CM

## 2015-08-02 DIAGNOSIS — Z79891 Long term (current) use of opiate analgesic: Secondary | ICD-10-CM | POA: Diagnosis not present

## 2015-08-02 DIAGNOSIS — Z8042 Family history of malignant neoplasm of prostate: Secondary | ICD-10-CM | POA: Diagnosis not present

## 2015-08-02 DIAGNOSIS — C61 Malignant neoplasm of prostate: Secondary | ICD-10-CM | POA: Diagnosis not present

## 2015-08-02 DIAGNOSIS — Z66 Do not resuscitate: Secondary | ICD-10-CM | POA: Diagnosis present

## 2015-08-02 DIAGNOSIS — M6250 Muscle wasting and atrophy, not elsewhere classified, unspecified site: Secondary | ICD-10-CM | POA: Diagnosis present

## 2015-08-02 DIAGNOSIS — R451 Restlessness and agitation: Secondary | ICD-10-CM | POA: Diagnosis not present

## 2015-08-02 MED ORDER — HYDROMORPHONE 1 MG/ML IV SOLN
INTRAVENOUS | Status: DC
  Administered 2015-08-02: 2.8 mg via INTRAVENOUS
  Administered 2015-08-02: 1.17 mg via INTRAVENOUS
  Administered 2015-08-02: 3.26 mg via INTRAVENOUS
  Administered 2015-08-03: 1.62 mg via INTRAVENOUS
  Administered 2015-08-03: 3.125 mg via INTRAVENOUS
  Administered 2015-08-03: 1.08 mg via INTRAVENOUS

## 2015-08-02 MED ORDER — SODIUM CHLORIDE 0.9% FLUSH: 9.0000 mL | INTRAVENOUS | Status: DC | PRN

## 2015-08-02 MED ORDER — ONDANSETRON HCL 4 MG/2ML IJ SOLN: 4.0000 mg | Freq: Four times a day (QID) | INTRAMUSCULAR | Status: DC | PRN

## 2015-08-02 MED ORDER — NALOXONE HCL 0.4 MG/ML IJ SOLN: 0.4000 mg | INTRAMUSCULAR | Status: DC | PRN

## 2015-08-02 MED ORDER — MEGESTROL ACETATE 40 MG/ML PO SUSP
400.0000 mg | Freq: Every day | ORAL | Status: DC | PRN
  Filled 2015-08-02: qty 10

## 2015-08-02 MED ORDER — PROCHLORPERAZINE MALEATE 10 MG PO TABS: 10.0000 mg | ORAL_TABLET | Freq: Four times a day (QID) | ORAL | Status: DC | PRN

## 2015-08-02 MED ORDER — DIPHENHYDRAMINE HCL 12.5 MG/5ML PO ELIX: 12.5000 mg | ORAL_SOLUTION | Freq: Four times a day (QID) | ORAL | Status: DC | PRN

## 2015-08-02 MED ORDER — MORPHINE SULFATE ER 30 MG PO TBCR
30.0000 mg | EXTENDED_RELEASE_TABLET | Freq: Two times a day (BID) | ORAL | Status: DC
  Administered 2015-08-02: 30 mg via ORAL
  Filled 2015-08-02: qty 1

## 2015-08-02 MED ORDER — SODIUM CHLORIDE 0.9% FLUSH
3.0000 mL | Freq: Two times a day (BID) | INTRAVENOUS | Status: DC
  Administered 2015-08-02: 3 mL via INTRAVENOUS

## 2015-08-02 MED ORDER — DIPHENHYDRAMINE HCL 50 MG/ML IJ SOLN: 12.5000 mg | Freq: Four times a day (QID) | INTRAMUSCULAR | Status: DC | PRN

## 2015-08-02 MED ORDER — SENNOSIDES-DOCUSATE SODIUM 8.6-50 MG PO TABS
1.0000 | ORAL_TABLET | Freq: Every day | ORAL | Status: DC
  Administered 2015-08-02 (×2): 1 via ORAL
  Filled 2015-08-02 (×2): qty 1

## 2015-08-02 MED ORDER — FAMOTIDINE 20 MG PO TABS
20.0000 mg | ORAL_TABLET | Freq: Two times a day (BID) | ORAL | Status: DC
  Administered 2015-08-02 – 2015-08-03 (×2): 20 mg via ORAL
  Filled 2015-08-02 (×3): qty 1

## 2015-08-02 MED ORDER — LISINOPRIL 20 MG PO TABS
20.0000 mg | ORAL_TABLET | Freq: Every day | ORAL | Status: DC
  Administered 2015-08-02 – 2015-08-03 (×2): 20 mg via ORAL
  Filled 2015-08-02 (×2): qty 1

## 2015-08-02 MED ORDER — LISINOPRIL-HYDROCHLOROTHIAZIDE 20-25 MG PO TABS: 1.0000 | ORAL_TABLET | Freq: Every day | ORAL | Status: DC

## 2015-08-02 MED ORDER — HYDROMORPHONE 1 MG/ML IV SOLN
INTRAVENOUS | Status: DC
  Administered 2015-08-02: 1.6 mg via INTRAVENOUS
  Administered 2015-08-02: 1.2 mg via INTRAVENOUS
  Administered 2015-08-02: 02:00:00 via INTRAVENOUS
  Filled 2015-08-02: qty 25

## 2015-08-02 MED ORDER — HYDROCHLOROTHIAZIDE 25 MG PO TABS
25.0000 mg | ORAL_TABLET | Freq: Every day | ORAL | Status: DC
  Administered 2015-08-02 – 2015-08-03 (×2): 25 mg via ORAL
  Filled 2015-08-02 (×2): qty 1

## 2015-08-02 MED ORDER — HYDROMORPHONE HCL 1 MG/ML IJ SOLN: 0.5000 mg | INTRAMUSCULAR | Status: DC | PRN

## 2015-08-02 MED ORDER — ALUM & MAG HYDROXIDE-SIMETH 200-200-20 MG/5ML PO SUSP
30.0000 mL | ORAL | Status: DC | PRN
  Administered 2015-08-02: 30 mL via ORAL
  Filled 2015-08-02 (×2): qty 30

## 2015-08-02 NOTE — Consult Note (Signed)
Consultation Note Date: 08/02/2015   Patient Name: Marcus [name]  DOB: 06-27-1945  MRN: [id number]  Age / Sex: 70 y.o., male  PCP: Denita Lung, MD Referring Physician: Lily Kocher, MD  Reason for Consultation: Establishing goals of care and pain management.   HPI/Patient Profile: 70 y.o. male    admitted on 08/01/2015    Clinical Assessment and Goals of Care:  70 year old gentleman with castration resistant prostate cancer, metastatic disease to the bone. Status post prostatectomy, radiation treatments. He was last seen by his oncologist 6-28, hospice care was recommended at that time. Patient lives at home with his wife. Has 2 daughters. Patient has been admitted to the hospitalist service on 7-11 with uncontrolled pain. Patient has right shoulder diffuse bone pain, pain in his upper back and pain in his chest area. His pain regimen consists of MS Contin 30 mg by mouth twice a day and breakthrough Percocet at home. He was artery on bowel regimen with MiraLAX and Senokot-S. Patient has had diffuse uncontrolled pain. He arrived to the hospital with uncontrolled pain and been admitted to hospitalist service. He is also on Xofigo every 30 days intravenously. He has been continued on MS Contin by mouth and has been placed on bolus only PCA.  Palliative care consultation placed for pain management, goals of care discussions.  Patient is resting in bed. He appears uncomfortable. Daughter Olivia Mackie and granddaughter present at the bedside. Patient's wife is with him in the emergency room all night and has gone home to rest for the day today. I introduced myself and palliative medicine as follows:  Palliative medicine is specialized medical care for people living with serious illness. It focuses on providing relief from the symptoms and stress of a serious illness. The goal is to improve quality of life for both the patient  and the family.  Patient complains that he is hurting all over. He especially points to his left shoulder area and his chest and upper back. He does not appear to be short of breath. Briefly discussed with patient about appropriate Dilaudid PCA management. Will add basal rate and DC MS Contin by mouth for now. Monitor patient's PCA use and opioid needs to adjust home regimen at the time of discharge. Gently brought up [place] and goals of care discussions including addition of hospice as an extra layer of support given patient's advanced metastatic prostate cancer. See recommendations below. Thank you for the consult. We will follow up in a.m. on 7-13 at 9 AM for family meeting.    NEXT OF KIN  wife [place] at [place]    1. Pain management: Dilaudid PCA: add basal rate, d/c MS contin for now. Adjust bolus dosage. Monitor PCA use. Bowel and anti emetic regimen 2. Family meeting to discuss code status, goals of care, addition of hospice services scheduled for 08-03-15 at 9 AM with patient, wife, 2 daughters. Wife resting at home today.   Code Status/Advance Care Planning:  Full code  Symptom Management:    see above   Palliative Prophylaxis:   Bowel Regimen   Psycho-social/Spiritual:   Desire for further Chaplaincy support:no  Additional Recommendations: Education on Hospice  Prognosis:   < 6 months  Discharge Planning: pending further discussions.       Primary Diagnoses: Present on Admission:  . Cancer associated pain  I have reviewed the medical record, interviewed the patient and family, and examined the patient. The following aspects are pertinent.  Past Medical History  Diagnosis Date  . Asthma   . Allergy     RHINITIS  . Hypertension   . Calcium oxalate renal stones   . Dyslipidemia   . Cancer ([place]) 2005    PROSTATE  . Prostate cancer ([place])     castration resistant prostate cancer with metastatic disease to the bone     Social History   Social History  . Marital Status: Married    Spouse Name: N/A  . Number of Children: N/A  . Years of Education: N/A   Social History Main Topics  . Smoking status: Never Smoker   . Smokeless tobacco: Never Used  . Alcohol Use: No  . Drug Use: No  . Sexual Activity: Not Currently   Other Topics Concern  . None   Social History Narrative   Family History  Problem Relation Age of Onset  . Cancer Father     prostate   Scheduled Meds: . lisinopril  20 mg Oral Daily   And  . hydrochlorothiazide  25 mg Oral Daily  . HYDROmorphone   Intravenous Q4H  . senna-docusate  1 tablet Oral QHS  . sodium chloride flush  3 mL Intravenous Q12H   Continuous Infusions:  PRN Meds:.alum & mag hydroxide-simeth, diphenhydrAMINE **OR** diphenhydrAMINE, megestrol, naloxone **AND** sodium chloride flush, ondansetron (ZOFRAN) IV, prochlorperazine Medications Prior to Admission:  Prior to Admission medications   Medication Sig Start Date End Date Taking? Authorizing Provider  lisinopril-hydrochlorothiazide (PRINZIDE,ZESTORETIC) 20-25 MG tablet TAKE ONE TABLET BY MOUTH ONCE DAILY 05/08/15  Yes Denita Lung, MD  megestrol (MEGACE) 40 MG/ML suspension TAKE TEN ML BY MOUTH ONCE DAILY Patient taking differently: Take 400 mg by mouth daily as needed (appetite).  02/28/15  Yes Wyatt Portela, MD  morphine (MS CONTIN) 30 MG 12 hr tablet Take 1 tablet (30 mg total) by mouth every 12 (twelve) hours. 07/28/15  Yes Heath Lark, MD  oxyCODONE-acetaminophen (PERCOCET/ROXICET) 5-325 MG tablet Take 1-2 tablets by mouth every 4 (four) hours as needed for severe pain (Do not take more than 12 tablets per 24 hours.). 07/28/15  Yes Heath Lark, MD  prochlorperazine (COMPAZINE) 10 MG tablet TAKE ONE TABLET BY MOUTH EVERY 6 HOURS AS NEEDED FOR NAUSEA AND  VOMITTING Patient taking differently: Take 10 mg by mouth every 6 (six) hours as needed for nausea or vomiting.  06/22/15  Yes Wyatt Portela, MD    senna-docusate (SENNA S) 8.6-50 MG per tablet Take 1 tablet by mouth at bedtime. 07/12/14  Yes Wyatt Portela, MD  XOFIGO 27 MCCI/ML SOLN Inject 50 KBq/kg into the vein every 30 (thirty) days. For 6 monthly infusions in nuclear medicine 05/26/14  Yes Tyler Pita, MD  Albuterol Sulfate (PROAIR RESPICLICK) 123XX123 (90 BASE) MCG/ACT AEPB Inhale 2 puffs into the lungs 4 (four) times daily as needed. Patient not taking: Reported on 08/01/2015 11/03/14   Camelia Eng Tysinger, PA-C   No Known Allergies Review of Systems + for generalized pain.   Physical Exam Awake  alert In distress Pain in shoulder back and chest S1 S2 Trace edema Non focal Clear Abdomen soft  Vital Signs: BP 105/82 mmHg  Pulse 96  Temp(Src) 98.7 F (37.1 C) (Oral)  Resp 14  Ht 6\' 2"  (1.88 m)  Wt 71.6 kg (157 lb 13.6 oz)  BMI 20.26 kg/m2  SpO2 98% Pain Assessment: 0-10 POSS *See Group Information*: 1-Acceptable,Awake and alert Pain Score: 4    SpO2: SpO2: 98 % O2 Device:SpO2: 98 % O2 Flow Rate: .O2 Flow Rate (L/min): 0 L/min  IO: Intake/output summary:  Intake/Output Summary (Last 24 hours) at 08/02/15 1146 Last data filed at 08/02/15 0851  Gross per 24 hour  Intake    600 ml  Output      0 ml  Net    600 ml    LBM: Last BM Date: 08/01/15 Baseline Weight: Weight: 76.658 kg (169 lb) Most recent weight: Weight: 71.6 kg (157 lb 13.6 oz)     Palliative Assessment/Data:   Flowsheet Rows        Most Recent Value   Intake Tab    Referral Department  Hospitalist   Unit at Time of Referral  Oncology Unit   Palliative Care Primary Diagnosis  Cancer   Date Notified  08/02/15   Palliative Care Type  New Palliative care   Reason for referral  Pain, Clarify Goals of Care   Date of Admission  08/01/15   Date first seen by Palliative Care  08/02/15   # of days Palliative referral response time  0 Day(s)   # of days IP prior to Palliative referral  1   Clinical Assessment    Palliative Performance Scale Score   30%   Pain Max last 24 hours  8   Pain Min Last 24 hours  6   Dyspnea Max Last 24 Hours  5   Dyspnea Min Last 24 hours  4   Psychosocial & Spiritual Assessment    Palliative Care Outcomes    Patient/Family meeting held?  Yes   Who was at the meeting?  patient daughter       Time In:  10 Time Out:  11 Time Total:  60 min  Greater than 50%  of this time was spent counseling and coordinating care related to the above assessment and plan.  Signed by: Loistine Chance, MD  [phone]  Please contact Palliative Medicine Team phone at [phone] for questions and concerns.  For individual provider: See Shea Evans

## 2015-08-02 NOTE — Progress Notes (Signed)
PROGRESS NOTE    Marcus [name]  [id number] DOB: 1945/08/31 DOA: 08/01/2015 PCP: Wyatt Haste, MD    Brief Narrative: 70 yo gentleman with metastatic prostate cancer admitted for management of intractable pain.   Assessment & Plan:   Active Problems:   Cancer associated pain   Pain in the chest   Encounter for palliative care   Goals of care, counseling/discussion   Palliative consult appreciated.  Pain level already improved with interventions offered.  Family meeting in the morning.   DVT prophylaxis: SCDs Code Status: FULL Family Communication: Daughter and granddaughter at bedside today when I rounded Disposition Plan: Expect he will go home at discharge.  Level of support needed to be determined at family meeting.  Hospice has been declined in the past.   Consultants:   Palliative care   Procedures: NONE   Antimicrobials: NONE   Subjective: Still having nausea, decreased appetite, but pain level improved since admission (previously 10 out of 10, now rated as 4 out of 10).  Objective: Filed Vitals:   08/02/15 0800 08/02/15 1230 08/02/15 1500 08/02/15 1600  BP:   118/80   Pulse:   92   Temp:   98.5 F (36.9 C)   TempSrc:   Oral   Resp: 14 13 14 16   Height:      Weight:      SpO2: 98%  98% 98%    Intake/Output Summary (Last 24 hours) at 08/02/15 1948 Last data filed at 08/02/15 1900  Gross per 24 hour  Intake    840 ml  Output    400 ml  Net    440 ml   Filed Weights   08/01/15 2146 08/02/15 0134  Weight: 76.658 kg (169 lb) 71.6 kg (157 lb 13.6 oz)    Examination:  General exam: Chronically ill appearing but NAD at this time Respiratory system: Clear to auscultation. Respiratory effort normal. Cardiovascular system: NR/RR. No pedal edema. Gastrointestinal system: Abdomen is nondistended, soft and nontender. Normal bowel sounds heard. Central nervous system: Alert and oriented. No focal neurological deficits. Psychiatry:  Judgement and insight appear normal. Mood & affect appropriate.     Data Reviewed: I have personally reviewed following labs and imaging studies  CBC:  Recent Labs Lab 08/01/15 2250  HGB 8.2*  HCT 0000000*   Basic Metabolic Panel:  Recent Labs Lab 08/01/15 2250  NA 134*  K 4.1  CL 97*  GLUCOSE 119*  BUN 23*  CREATININE 1.10   GFR: Estimated Creatinine Clearance: 63.3 mL/min (by C-G formula based on Cr of 1.1).       Radiology Studies: Dg Chest 2 View  08/01/2015  CLINICAL DATA:  Chest pain. History of prostate cancer metastatic to bone. EXAM: CHEST  2 VIEW COMPARISON:  11/08/2014 FINDINGS: Shallow inspiration with linear atelectasis in the lung bases. Heart size and pulmonary vascularity are normal. No focal airspace disease or consolidation in the lungs. No blunting of costophrenic angles. No pneumothorax. Mediastinal contours appear intact. Diffuse bone sclerosis consistent with diffuse metastatic disease. IMPRESSION: Shallow inspiration with atelectasis in the lung bases. Diffuse bone metastases. Electronically Signed   By: Lucienne Capers M.D.   On: 08/01/2015 22:56        Scheduled Meds: . famotidine  20 mg Oral BID  . lisinopril  20 mg Oral Daily   And  . hydrochlorothiazide  25 mg Oral Daily  . HYDROmorphone   Intravenous Q4H  . senna-docusate  1 tablet Oral QHS  . sodium  chloride flush  3 mL Intravenous Q12H   Continuous Infusions:    LOS: 0 days    Time spent: 20 minutes    Eber Jones, MD [facility] Pager [phone]  If 7PM-7AM, please contact night-coverage [email or web address] Password [facility] [place] 08/02/2015, 7:48 PM

## 2015-08-02 NOTE — H&P (Signed)
History and Physical    Marcus [name] [id number] DOB: [birth year] DOA: 08/01/2015   PCP: Wyatt Haste, MD Chief Complaint:  Chief Complaint  Patient presents with  . Cancer  . Generalized Body Aches    HPI: Marcus [name] is a 70 y.o. male with medical history significant of Metastatic prostate cancer with mets to bone, pain secondary to this.  Patient presents to the ED with c/o uncontrolled pain despite taking his home MS Contin and Oxy IR.  Pain is diffuse, worse in shoulders and chest.  Suddenly got worse at about 3pm today.  Nothing makes symptoms better or worse.  Review of his oncology office note from 6/28 confirms that the patient has "incurable malignancy", "likely limited life expectancy" and they did recommend hospice care as they had very little additionally to offer from chemotherapy standpoint.  ED Course: Pain controlled with IV dilaudid.  Review of Systems: As per HPI otherwise 10 point review of systems negative.    Past Medical History  Diagnosis Date  . Asthma   . Allergy     RHINITIS  . Hypertension   . Calcium oxalate renal stones   . Dyslipidemia   . Cancer ([place]) 2005    PROSTATE  . Prostate cancer ([place])     castration resistant prostate cancer with metastatic disease to the bone     Past Surgical History  Procedure Laterality Date  . Prostatectomy  06/2003     reports that he has never smoked. He has never used smokeless tobacco. He reports that he does not drink alcohol or use illicit drugs.  No Known Allergies  Family History  Problem Relation Age of Onset  . Cancer Father     prostate      Prior to Admission medications   Medication Sig Start Date End Date Taking? Authorizing Provider  lisinopril-hydrochlorothiazide (PRINZIDE,ZESTORETIC) 20-25 MG tablet TAKE ONE TABLET BY MOUTH ONCE DAILY 05/08/15  Yes Denita Lung, MD  megestrol (MEGACE) 40 MG/ML suspension TAKE TEN ML BY MOUTH ONCE DAILY Patient taking differently:  Take 400 mg by mouth daily as needed (appetite).  02/28/15  Yes Wyatt Portela, MD  morphine (MS CONTIN) 30 MG 12 hr tablet Take 1 tablet (30 mg total) by mouth every 12 (twelve) hours. 07/28/15  Yes Heath Lark, MD  oxyCODONE-acetaminophen (PERCOCET/ROXICET) 5-325 MG tablet Take 1-2 tablets by mouth every 4 (four) hours as needed for severe pain (Do not take more than 12 tablets per 24 hours.). 07/28/15  Yes Heath Lark, MD  prochlorperazine (COMPAZINE) 10 MG tablet TAKE ONE TABLET BY MOUTH EVERY 6 HOURS AS NEEDED FOR NAUSEA AND  VOMITTING Patient taking differently: Take 10 mg by mouth every 6 (six) hours as needed for nausea or vomiting.  06/22/15  Yes Wyatt Portela, MD  senna-docusate (SENNA S) 8.6-50 MG per tablet Take 1 tablet by mouth at bedtime. 07/12/14  Yes Wyatt Portela, MD  XOFIGO 27 MCCI/ML SOLN Inject 50 KBq/kg into the vein every 30 (thirty) days. For 6 monthly infusions in nuclear medicine 05/26/14  Yes Tyler Pita, MD  Albuterol Sulfate (PROAIR RESPICLICK) 123XX123 (90 BASE) MCG/ACT AEPB Inhale 2 puffs into the lungs 4 (four) times daily as needed. Patient not taking: Reported on 08/01/2015 11/03/14   Carlena Hurl, PA-C    Physical Exam: Filed Vitals:   08/01/15 2146 08/01/15 2356  BP: 121/80 105/67  Pulse: 88 77  Temp: 98.4 F (36.9 C)   TempSrc: Oral   Resp:  15 18  Height: 6\' 2"  (1.88 m)   Weight: 76.658 kg (169 lb)   SpO2: 98% 95%      Constitutional: NAD, calm, comfortable Eyes: PERRL, lids and conjunctivae normal ENMT: Mucous membranes are moist. Posterior pharynx clear of any exudate or lesions.Normal dentition.  Neck: normal, supple, no masses, no thyromegaly Respiratory: clear to auscultation bilaterally, no wheezing, no crackles. Normal respiratory effort. No accessory muscle use.  Cardiovascular: Regular rate and rhythm, no murmurs / rubs / gallops. No extremity edema. 2+ pedal pulses. No carotid bruits.  Abdomen: no tenderness, no masses palpated. No  hepatosplenomegaly. Bowel sounds positive.  Musculoskeletal: no clubbing / cyanosis. No joint deformity upper and lower extremities. Good ROM, no contractures. Normal muscle tone.  Skin: no rashes, lesions, ulcers. No induration Neurologic: CN 2-12 grossly intact. Sensation intact, DTR normal. Strength 5/5 in all 4.  Psychiatric: Normal judgment and insight. Alert and oriented x 3. Normal mood.    Labs on Admission: I have personally reviewed following labs and imaging studies  CBC:  Recent Labs Lab 08/01/15 2250  HGB 8.2*  HCT 0000000*   Basic Metabolic Panel:  Recent Labs Lab 08/01/15 2250  NA 134*  K 4.1  CL 97*  GLUCOSE 119*  BUN 23*  CREATININE 1.10   GFR: Estimated Creatinine Clearance: 67.8 mL/min (by C-G formula based on Cr of 1.1). Liver Function Tests: No results for input(s): AST, ALT, ALKPHOS, BILITOT, PROT, ALBUMIN in the last 168 hours. No results for input(s): LIPASE, AMYLASE in the last 168 hours. No results for input(s): AMMONIA in the last 168 hours. Coagulation Profile: No results for input(s): INR, PROTIME in the last 168 hours. Cardiac Enzymes: No results for input(s): CKTOTAL, CKMB, CKMBINDEX, TROPONINI in the last 168 hours. BNP (last 3 results) No results for input(s): PROBNP in the last 8760 hours. HbA1C: No results for input(s): HGBA1C in the last 72 hours. CBG: No results for input(s): GLUCAP in the last 168 hours. Lipid Profile: No results for input(s): CHOL, HDL, LDLCALC, TRIG, CHOLHDL, LDLDIRECT in the last 72 hours. Thyroid Function Tests: No results for input(s): TSH, T4TOTAL, FREET4, T3FREE, THYROIDAB in the last 72 hours. Anemia Panel: No results for input(s): VITAMINB12, FOLATE, FERRITIN, TIBC, IRON, RETICCTPCT in the last 72 hours. Urine analysis:    Component Value Date/Time   BILIRUBINUR n 11/08/2014 1404   PROTEINUR 2 11/08/2014 1404   UROBILINOGEN negative 11/08/2014 1404   NITRITE n 11/08/2014 1404   LEUKOCYTESUR Negative  11/08/2014 1404   Sepsis Labs: @LABRCNTIP (procalcitonin:4,lacticidven:4) )No results found for this or any previous visit (from the past 240 hour(s)).   Radiological Exams on Admission: Dg Chest 2 View  08/01/2015  CLINICAL DATA:  Chest pain. History of prostate cancer metastatic to bone. EXAM: CHEST  2 VIEW COMPARISON:  11/08/2014 FINDINGS: Shallow inspiration with linear atelectasis in the lung bases. Heart size and pulmonary vascularity are normal. No focal airspace disease or consolidation in the lungs. No blunting of costophrenic angles. No pneumothorax. Mediastinal contours appear intact. Diffuse bone sclerosis consistent with diffuse metastatic disease. IMPRESSION: Shallow inspiration with atelectasis in the lung bases. Diffuse bone metastases. Electronically Signed   By: Lucienne Capers M.D.   On: 08/01/2015 22:56    EKG: Independently reviewed.  Assessment/Plan Active Problems:   Cancer associated pain    Cancer associated pain - This is an unfortunate 70 yo M who presents to the ED with uncontrolled pain due to incurable, terminal, metastatic prostate cancer which now is continually  progressive despite all attempts to treat.  Patient is not a candidate for further radiation or chemotherapy according 6/28 oncology note and oncology recommends hospice.  Continue home MS contin long acting  Holding Oxy IR  Reduced dose Dilaudid PCA for pain management, using order set pathway   Will put patient on continuous pulse ox and tele monitor while on this  Needs Palliative care consultation in AM for adjustment of home meds, goals of care discussion, and oncology has recommended hospice which patient has previously declined.   DVT prophylaxis: SCDs only due to recent thrombocytopenia Code Status: Full for now - I expect they will have further discussions regarding this with pal care tomorrow. Family Communication: Family at bedside Consults called: None Admission status: Admit to  inpatient for PCA   Etta Quill DO [facility] Pager [phone] from 7PM-7AM  If 7AM-7PM, please contact the day physician for the patient [email or web address] Password [facility]  08/02/2015, 12:26 AM

## 2015-08-03 ENCOUNTER — Telehealth: Payer: Self-pay

## 2015-08-03 DIAGNOSIS — G893 Neoplasm related pain (acute) (chronic): Principal | ICD-10-CM

## 2015-08-03 LAB — BASIC METABOLIC PANEL
Anion gap: 8 (ref 5–15)
BUN: 20 mg/dL (ref 6–20)
CHLORIDE: 102 mmol/L (ref 101–111)
CO2: 26 mmol/L (ref 22–32)
Calcium: 9.4 mg/dL (ref 8.9–10.3)
Creatinine, Ser: 0.94 mg/dL (ref 0.61–1.24)
GFR calc non Af Amer: >60 mL/min (ref >60)
GLUCOSE: 102 mg/dL — AB (ref 65–99)
POTASSIUM: 4.5 mmol/L (ref 3.5–5.1)
SODIUM: 136 mmol/L (ref 135–145)

## 2015-08-03 LAB — CBC
HCT: 27.2 % — ABNORMAL LOW (ref 39.0–52.0)
HEMOGLOBIN: 8.4 g/dL — AB (ref 13.0–17.0)
MCH: 28.4 pg (ref 26.0–34.0)
MCHC: 30.9 g/dL (ref 30.0–36.0)
MCV: 91.9 fL (ref 78.0–100.0)
PLATELETS: 46 10*3/uL — AB (ref 150–400)
RBC: 2.96 MIL/uL — AB (ref 4.22–5.81)
RDW: 19.6 % — ABNORMAL HIGH (ref 11.5–15.5)
WBC: 16.9 10*3/uL — ABNORMAL HIGH (ref 4.0–10.5)

## 2015-08-03 MED ORDER — OXYCODONE HCL 5 MG/5ML PO SOLN
5.0000 mg | ORAL | Status: DC | PRN
  Administered 2015-08-03 (×3): 5 mg via ORAL
  Filled 2015-08-03 (×3): qty 5

## 2015-08-03 MED ORDER — ALUM & MAG HYDROXIDE-SIMETH 200-200-20 MG/5ML PO SUSP: 30.0000 mL | ORAL | Status: AC | PRN

## 2015-08-03 MED ORDER — FAMOTIDINE 20 MG PO TABS: 20.0000 mg | ORAL_TABLET | Freq: Two times a day (BID) | ORAL | Status: AC

## 2015-08-03 MED ORDER — HYDROMORPHONE HCL 1 MG/ML IJ SOLN
1.0000 mg | INTRAMUSCULAR | Status: DC | PRN
  Administered 2015-08-03 (×4): 1 mg via INTRAVENOUS
  Filled 2015-08-03 (×4): qty 1

## 2015-08-03 MED ORDER — FENTANYL 25 MCG/HR TD PT72: 25.0000 ug | MEDICATED_PATCH | TRANSDERMAL | Status: AC

## 2015-08-03 MED ORDER — FENTANYL 25 MCG/HR TD PT72
25.0000 ug | MEDICATED_PATCH | TRANSDERMAL | Status: DC
  Administered 2015-08-03: 25 ug via TRANSDERMAL
  Filled 2015-08-03: qty 1

## 2015-08-03 MED ORDER — OXYCODONE HCL 5 MG/5ML PO SOLN: 5.0000 mg | ORAL | Status: AC | PRN

## 2015-08-03 NOTE — Telephone Encounter (Signed)
Beth with [facility] called to see if you will be the signing doctor for pt's Hospice Care. Beth can be reached at [phone]  Ext [phone]. She needs authorization before pt can be discharged home from [place] with hospice.  /RLB   Per Dr. Redmond School- he will sign for orders.   Beth aware. Victorino December

## 2015-08-03 NOTE — Progress Notes (Signed)
Spoke with pt's wife and daughter concerning Hospice selected was [facility]. Referral given called to [place] [facility] [place].

## 2015-08-03 NOTE — Progress Notes (Signed)
Reviewed discharge information with patient and caregiver. Answered all questions. Patient/caregiver able to teach back medications and reasons to contact MD/911. Patient verbalizes importance of PCP follow up appointment.  Adam Demary M. Mali Eppard, RN  

## 2015-08-03 NOTE — Discharge Summary (Signed)
Marcus [name], is a 70 y.o. male  DOB September 11, 1945  MRN [id number].  Admission date:  08/01/2015  Admitting Physician  Etta Quill, DO  Discharge Date:  08/03/2015   Primary MD  Wyatt Haste, MD  Recommendations for primary care physician for things to follow:   Patient is being discharged home with home hospice services. His antihypertensive agents were held due to risk of hypotension, his systolic blood pressure on the day of discharge was 111/75. Patient has been placed on fentanyl patch and oxycodone as part of his pain regimen for his metastatic prostate cancer. Per patient's family request  patient will be discharged with Foley catheter for patient's comfort.    Admission Diagnosis  Prostate cancer ([place]) [C61] Essential hypertension [I10] Chest pain, unspecified chest pain type [R07.9]   Discharge Diagnosis  Prostate cancer ([place]) [C61] Essential hypertension [I10] Chest pain, unspecified chest pain type [R07.9]    Active Problems:   Cancer associated pain   Pain in the chest   Encounter for palliative care   Goals of care, counseling/discussion      Past Medical History  Diagnosis Date  . Asthma   . Allergy     RHINITIS  . Hypertension   . Calcium oxalate renal stones   . Dyslipidemia   . Cancer ([place]) 2005    PROSTATE  . Prostate cancer ([place])     castration resistant prostate cancer with metastatic disease to the bone     Past Surgical History  Procedure Laterality Date  . Prostatectomy  06/2003       HPI  from the history and physical done on the day of admission:   This is a 70 year old male who presents to the  hospital with uncontrolled pain despite taking his home MS Contin and oxycodone. His pain was diffuse, worse in his shoulders with no improving or worsening factors. On his initial physical examination his mucous membranes were moist, his lungs were clear  to auscultation bilaterally, heart S1-S2 present rhythmic, his abdomen was soft nontender, his lower extremity had no edema. Patient was comfortable after receiving IV hydromorphone. His serum sodium was 134, creatinine 1.10. his hemoglobin was 8.2 with hematocrit 24.   Patient was admitted to hospital with the working diagnosis of uncontrolled pain related to underlying malignancy/ metastatic prostate cancer.      Hospital Course:   1. Cardiovascular. Hypertension patient's blood pressure has remained between 108 and 111, patient will have his antihypertensive agents held due to risk of developing hypotension.  2. Pulmonary. No signs of volume overload or pulmonary infection. Patient had oximetry monitor with oxygen saturation 96% on room air.  3. Nephrology. Kidney function when stable with a creatinine of 1.10 and 0.94.   4. Oncology. Metastatic prostate cancer. Patient was seen by palliative care, patient was placed in oxycodone 5 mg every 12 hours as needed and a fentanyl patch 25 mg. Patient's  pain improved, family decided to take patient home with home hospice services.  Discharge Condition:  stable  Follow UP     Consults obtained - Palliative Care  Diet and Activity recommendation: See Discharge Instructions below  Discharge Instructions    Discharge Instructions    Diet - low sodium heart healthy    Complete by:  As directed      Discharge instructions    Complete by:  As directed   Please follow with home hospice.     Increase activity slowly    Complete by:  As directed              Discharge Medications       Medication List    STOP taking these medications        lisinopril-hydrochlorothiazide 20-25 MG tablet  Commonly known as:  PRINZIDE,ZESTORETIC     morphine 30 MG 12 hr tablet  Commonly known as:  MS CONTIN     oxyCODONE-acetaminophen 5-325 MG tablet  Commonly known as:  PERCOCET/ROXICET     XOFIGO 27 MCCI/ML Soln  Generic drug:  Radium  Ra 223 Dichloride      TAKE these medications        Albuterol Sulfate 108 (90 Base) MCG/ACT Aepb  Commonly known as:  PROAIR RESPICLICK  Inhale 2 puffs into the lungs 4 (four) times daily as needed.     alum & mag hydroxide-simeth 200-200-20 MG/5ML suspension  Commonly known as:  MAALOX/MYLANTA  Take 30 mLs by mouth every 4 (four) hours as needed for indigestion.     famotidine 20 MG tablet  Commonly known as:  PEPCID  Take 1 tablet (20 mg total) by mouth 2 (two) times daily.     fentaNYL 25 MCG/HR patch  Commonly known as:  DURAGESIC - dosed mcg/hr  Place 1 patch (25 mcg total) onto the skin every 3 (three) days.     megestrol 40 MG/ML suspension  Commonly known as:  MEGACE  TAKE TEN ML BY MOUTH ONCE DAILY     oxyCODONE 5 MG/5ML solution  Commonly known as:  ROXICODONE  Take 5 mLs (5 mg total) by mouth every 2 (two) hours as needed for moderate pain or severe pain.     prochlorperazine 10 MG tablet  Commonly known as:  COMPAZINE  TAKE ONE TABLET BY MOUTH EVERY 6 HOURS AS NEEDED FOR NAUSEA AND  VOMITTING     senna-docusate 8.6-50 MG tablet  Commonly known as:  SENNA S  Take 1 tablet by mouth at bedtime.        Major procedures and Radiology Reports - PLEASE review detailed and final reports for all details, in brief -     Dg Chest 2 View  08/01/2015  CLINICAL DATA:  Chest pain. History of prostate cancer metastatic to bone. EXAM: CHEST  2 VIEW COMPARISON:  11/08/2014 FINDINGS: Shallow inspiration with linear atelectasis in the lung bases. Heart size and pulmonary vascularity are normal. No focal airspace disease or consolidation in the lungs. No blunting of costophrenic angles. No pneumothorax. Mediastinal contours appear intact. Diffuse bone sclerosis consistent with diffuse metastatic disease. IMPRESSION: Shallow inspiration with atelectasis in the lung bases. Diffuse bone metastases. Electronically Signed   By: Lucienne Capers M.D.   On: 08/01/2015 22:56    Micro  Results    No results found for this or any previous visit (from the past 240 hour(s)).     Today   Subjective    Marcus [name] is feeling well, his pain is controlled, he is out of bed, denies any nausea, vomiting or diarrhea. No  shortness of breath or chest pain  Objective   Blood pressure 111/75, pulse 100, temperature 97.8 F (36.6 C), temperature source Oral, resp. rate 19, height 6\' 2"  (1.88 m), weight 71.6 kg (157 lb 13.6 oz), SpO2 100 %.   Intake/Output Summary (Last 24 hours) at 08/03/15 1452 Last data filed at 08/03/15 [id number]  Gross per 24 hour  Intake    200 ml  Output    425 ml  Net   -225 ml    Exam Gen. Awake and alert Oral mucosa moist Lungs are clear to auscultation bilaterally Heart S1-S2 present rhythmic no gallops, rubs or murmurs Abdomen soft nontender nondistended Lower extremities no edema Neurologically nonfocal   Data Review   CBC w Diff: Lab Results  Component Value Date   WBC 16.9* 08/03/2015   WBC 13.9* 07/19/2015   HGB 8.4* 08/03/2015   HGB 9.1* 07/19/2015   HCT 27.2* 08/03/2015   HCT 28.4* 07/19/2015   PLT 46* 08/03/2015   PLT 96* 07/19/2015   LYMPHOPCT 7.7* 07/19/2015   LYMPHOPCT 20 05/25/2012   MONOPCT 6.9 07/19/2015   MONOPCT 5 05/25/2012   EOSPCT 1.1 07/19/2015   EOSPCT 3 05/25/2012   BASOPCT 1.4 07/19/2015   BASOPCT 0 05/25/2012    CMP: Lab Results  Component Value Date   NA 136 08/03/2015   NA 137 07/19/2015   K 4.5 08/03/2015   K 3.9 07/19/2015   CL 102 08/03/2015   CO2 26 08/03/2015   CO2 24 07/19/2015   BUN 20 08/03/2015   BUN 12.4 07/19/2015   CREATININE 0.94 08/03/2015   CREATININE 0.9 07/19/2015   CREATININE 1.09 11/08/2014   PROT 7.3 07/19/2015   PROT 6.7 11/08/2014   ALBUMIN 2.3* 07/19/2015   ALBUMIN 3.6 11/08/2014   BILITOT 0.59 07/19/2015   BILITOT 0.6 11/08/2014   ALKPHOS 318* 07/19/2015   ALKPHOS 75 11/08/2014   AST 70* 07/19/2015   AST 17 11/08/2014   ALT 61* 07/19/2015   ALT 16  11/08/2014  .   Total Time in preparing paper work, data evaluation and todays exam - 45 minutes  Tawni Millers M.D on 08/03/2015 at 2:52 PM  [facility]   Office  [phone]

## 2015-08-03 NOTE — Progress Notes (Signed)
Daily Progress Note   Patient Name: Marcus [name]       Date: 08/03/2015 DOB: [birth year]  Age: 70 y.o. MRN#: [id number] Attending Physician: Tawni Millers, * Primary Care Physician: Wyatt Haste, MD Admit Date: 08/01/2015  Reason for Consultation/Follow-up: Establishing goals of care  Subjective:  awake alert  Resting in bed Was restless all night Peripheral IV was lost See family meeting note below:  Length of Stay: 1  Current Medications: Scheduled Meds:  . famotidine  20 mg Oral BID  . fentaNYL  25 mcg Transdermal Q72H  . lisinopril  20 mg Oral Daily   And  . hydrochlorothiazide  25 mg Oral Daily  . senna-docusate  1 tablet Oral QHS  . sodium chloride flush  3 mL Intravenous Q12H    Continuous Infusions:    PRN Meds: alum & mag hydroxide-simeth, HYDROmorphone (DILAUDID) injection, megestrol, oxyCODONE, prochlorperazine  Physical Exam         Weak elderly gentleman Muscle wasting S1 S2 Clear Abdomen soft Non focal.   Vital Signs: BP 108/78 mmHg  Pulse 100  Temp(Src) 98.7 F (37.1 C) (Oral)  Resp 19  Ht 6\' 2"  (1.88 m)  Wt 71.6 kg (157 lb 13.6 oz)  BMI 20.26 kg/m2  SpO2 96% SpO2: SpO2: 96 % O2 Device: O2 Device: Not Delivered O2 Flow Rate: O2 Flow Rate (L/min): 0 L/min  Intake/output summary:  Intake/Output Summary (Last 24 hours) at 08/03/15 0929 Last data filed at 08/03/15 [id number]  Gross per 24 hour  Intake    480 ml  Output    625 ml  Net   -145 ml   LBM: Last BM Date: 08/01/15 Baseline Weight: Weight: 76.658 kg (169 lb) Most recent weight: Weight: 71.6 kg (157 lb 13.6 oz)       Palliative Assessment/Data:    Flowsheet Rows        Most Recent Value   Intake Tab    Referral Department  Hospitalist   Unit at Time of Referral   Oncology Unit   Palliative Care Primary Diagnosis  Cancer   Date Notified  08/02/15   Palliative Care Type  Return patient Palliative Care   Reason for referral  Pain, Clarify Goals of Care, Counsel Regarding Hospice   Date of Admission  08/01/15   Date first seen  by Palliative Care  08/02/15   # of days Palliative referral response time  0 Day(s)   # of days IP prior to Palliative referral  1   Clinical Assessment    Palliative Performance Scale Score  30%   Pain Max last 24 hours  7   Pain Min Last 24 hours  5   Dyspnea Max Last 24 Hours  4   Dyspnea Min Last 24 hours  3   Nausea Max Last 24 Hours  3   Nausea Min Last 24 Hours  2   Psychosocial & Spiritual Assessment    Palliative Care Outcomes    Patient/Family meeting held?  Yes   Who was at the meeting?  wife patient 2 dtrs   Palliative Care follow-up planned  Yes, Home      Patient Active Problem List   Diagnosis Date Noted  . Cancer associated pain 08/02/2015  . Pain in the chest   . Encounter for palliative care   . Goals of care, counseling/discussion   . Absolute anemia 12/26/2014  . Prostate cancer ([place]) 11/30/2010  . Hypertension 11/30/2010  . Hyperlipidemia LDL goal < 100 11/30/2010  . Allergic rhinitis, mild 11/30/2010  . History of asthma 11/30/2010    Palliative Care Assessment & Plan   Patient Profile:    Assessment:  metastatic prostate cancer Bone pain from cancer Restlessness/ confusion overnight   Recommendations/Plan:   family meeting with patient, his wife of 59 years, his 2 daughters:   DNR DNI  Home with Hospice, Family elects [facility], they want hospice RN, aide, medical equipment such as hospital bed, suction, bedside commode. They don't want hospice social worker. They have their own pastor, they don't want hospice chaplain.   D/C Dilaudid IV PCA. Start trans dermal fentanyl, start Oxycodone solution for break through pain. Hospice to adjust these medications at  home as per patient's needs. Continue bowel regimen. To still have IV Dilaudid PRN up until D/C.   Will need portable DNR on D/C.   Family states that they have strong faith, spiritual belief that the patient will be healed. Validated their faith, their belief system. Offered active listening and supportive care. All questions answered to the best of my ability.   Code Status:    Code Status Orders        Start     Ordered   08/03/15 0920  Do not attempt resuscitation (DNR)   Continuous    Question Answer Comment  In the event of cardiac or respiratory ARREST Do not call a "code blue"   In the event of cardiac or respiratory ARREST Do not perform Intubation, CPR, defibrillation or ACLS   In the event of cardiac or respiratory ARREST Use medication by any route, position, wound care, and other measures to relive pain and suffering. May use oxygen, suction and manual treatment of airway obstruction as needed for comfort.      08/03/15 0919    Code Status History    Date Active Date Inactive Code Status Order ID Comments User Context   08/03/2015  9:19 AM 08/03/2015  9:19 AM DNR [id number]  Loistine Chance, MD Inpatient   08/03/2015  9:19 AM 08/03/2015  9:19 AM DNR [id number]  Loistine Chance, MD Inpatient   08/02/2015 12:25 AM 08/03/2015  9:19 AM Full Code [id number]  Etta Quill, DO ED       Prognosis:   < 4 weeks  Discharge Planning:  Home with  Hospice  Care plan was discussed with  Patient wife 2 daughters and patient's RN  Thank you for allowing the Palliative Medicine Team to assist in the care of this patient.   Time In:  845 Time Out: 920 Total Time 35 Prolonged Time Billed  no       Greater than 50%  of this time was spent counseling and coordinating care related to the above assessment and plan.  Loistine Chance, MD [phone]  Please contact Palliative Medicine Team phone at [phone] for questions and concerns.

## 2015-08-07 ENCOUNTER — Other Ambulatory Visit: Payer: Self-pay | Admitting: Family Medicine

## 2015-08-07 ENCOUNTER — Telehealth: Payer: Self-pay

## 2015-08-07 NOTE — Telephone Encounter (Signed)
Hospice called and said pt was doing good but he was constipated and wanted to give pt mirlax and I told her to go ahead

## 2015-08-14 ENCOUNTER — Telehealth: Payer: Self-pay | Admitting: Family Medicine

## 2015-08-14 NOTE — Telephone Encounter (Signed)
Sypathy card sent

## 2015-08-15 ENCOUNTER — Encounter: Payer: Self-pay | Admitting: Internal Medicine

## 2015-08-22 DEATH — deceased

## 2015-09-29 ENCOUNTER — Telehealth: Payer: Self-pay | Admitting: Family Medicine

## 2015-09-29 NOTE — Telephone Encounter (Signed)
Sympathy card mailed 

## 2015-11-16 ENCOUNTER — Ambulatory Visit: Payer: Medicare Other | Admitting: Oncology

## 2015-11-16 ENCOUNTER — Ambulatory Visit: Payer: Medicare Other

## 2015-11-16 ENCOUNTER — Other Ambulatory Visit: Payer: Medicare Other

## 2015-12-30 ENCOUNTER — Other Ambulatory Visit: Payer: Self-pay | Admitting: Nurse Practitioner

## 2017-04-04 IMAGING — NM NM BONE WHOLE BODY
2 series · 2 of 2 positions shown · non-contrast
Comparison: 05/24/2014.

CLINICAL DATA: Prostate cancer with bone metastases.

EXAM:
NUCLEAR MEDICINE WHOLE BODY BONE SCAN
TECHNIQUE: Whole body anterior and posterior images were obtained approximately
3 hours after intravenous injection of radiopharmaceutical.
RADIOPHARMACEUTICALS:  23.5 mCi 8echnetium-DDm MDP IV

[Series 1: whole body · 2.66mm/px · 1 of 1 slices shown (1 of 2)]
[im 1/1]
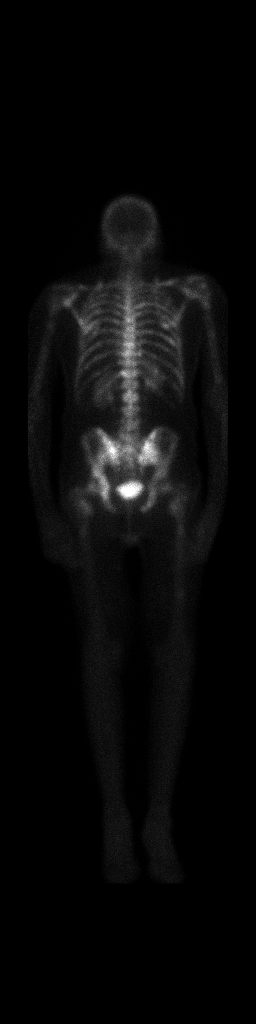

[Series 1: whole body · 2.66mm/px · 1 of 1 slices shown (2 of 2)]
[im 1/1]
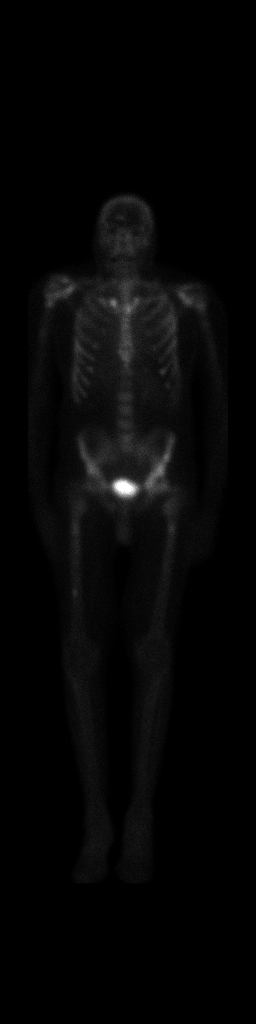

[2 of 2 positions shown; findings below may reference images not displayed]

FINDINGS: Previously demonstrated multiple foci of increased tracer uptake in
the thoracic spine are less intense than on the previous
examination. There is also an overall decrease in degree of
intensity of uptake in the innominate bones bilaterally. There is
more confluent uptake in the sacral ala bilaterally. There are also
multiple new foci of increased tracer uptake in both femurs. There
are also new foci of increased uptake in both proximal humeri and
both shoulders. Multiple foci of increased tracer uptake are again
demonstrated in the sternum and at both sternoclavicular joints. A
focus of increased tracer uptake in the right frontal bone is
slightly more prominent. Increased tracer uptake in multiple
bilateral ribs appears slightly less prominent. Normal renal and
bladder activity is noted.
IMPRESSION: 1. There are multiple new metastases in both femurs, both humeri and
in both shoulders.
2. Mild increase in tracer activity within a right frontal bone
probable metastasis.
3. Progressive metastatic disease in both sacral ala.
4. Mildly improved metastatic disease in the both innominate bones,
thoracic spine and multiple ribs.

## 2017-04-04 IMAGING — CT CT ABD-PELV W/ CM
2 of 5 series · 16 of 46 positions shown, 18 images · IV contrast (OMNIPAQUE)
Comparison: CT of the abdomen and pelvis 05/24/2014.

CLINICAL DATA: 69-year-old male with history of metastatic prostate
cancer diagnosed in 6780 status post prostatectomy. Prior radiation
therapy. Elevated PSA levels. Bilateral leg pain for the past 30
days.

EXAM:
CT ABDOMEN AND PELVIS WITH CONTRAST
TECHNIQUE: Multidetector CT imaging of the abdomen and pelvis was performed
using the standard protocol following bolus administration of
intravenous contrast.
CONTRAST:  100mL OMNIPAQUE IOHEXOL 300 MG/ML SOLN, 50mL OMNIPAQUE
IOHEXOL 300 MG/ML SOLN

[Series 3: rtn a/p with · axial · 0.84mm/px · z∈[-629,-204]mm · 13 of 97 slices shown, 15 images]
[im 6/97  soft-tissue]
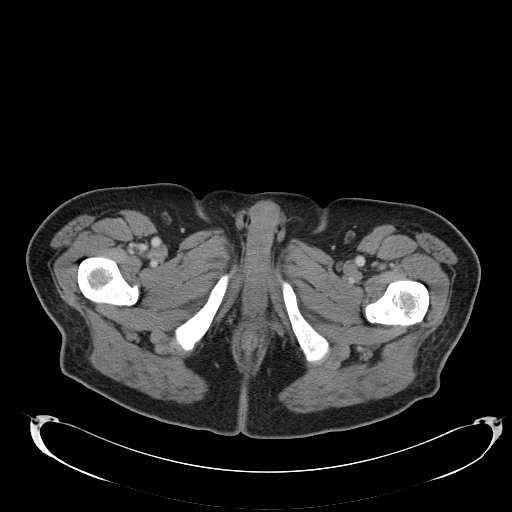
[im 6/97  bone]
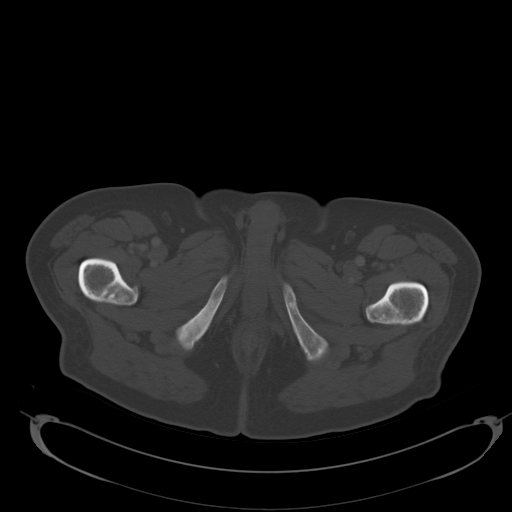
[im 16/97  soft-tissue]
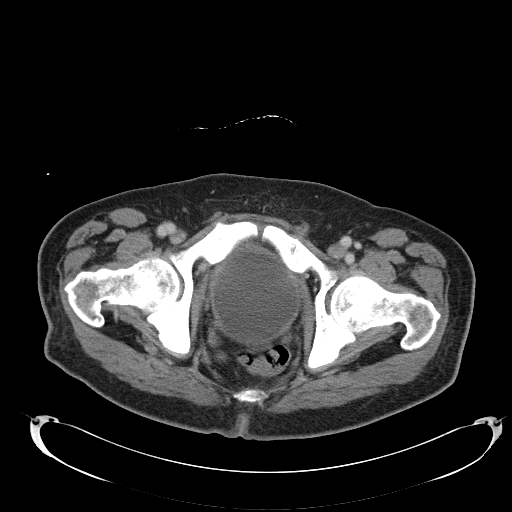
[im 21/97  soft-tissue]
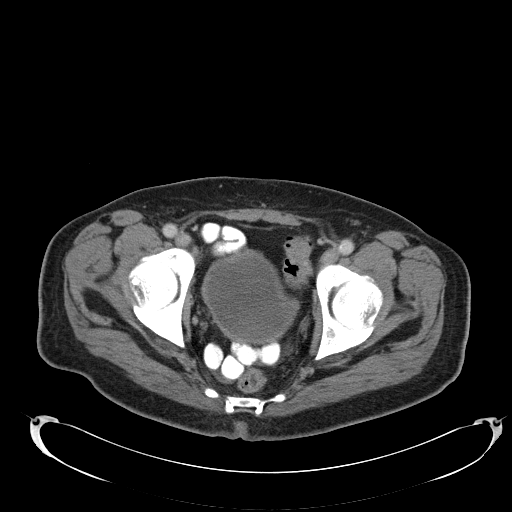
[im 26/97  soft-tissue]
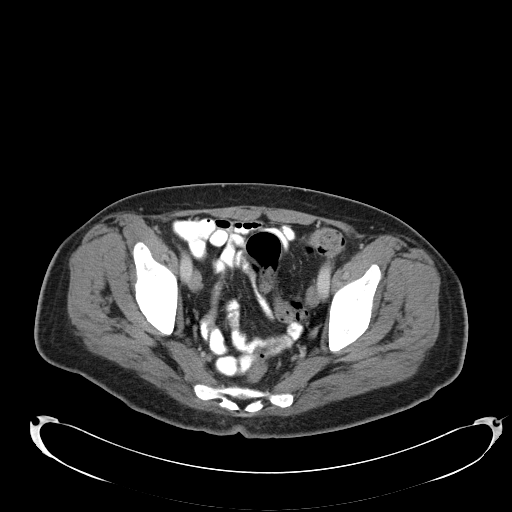
[im 36/97  soft-tissue]
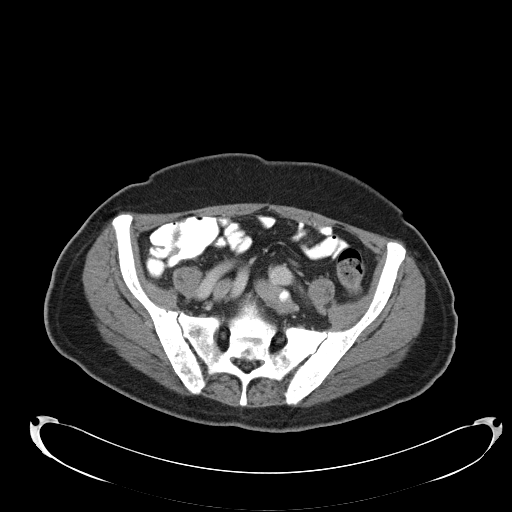
[im 41/97  soft-tissue]
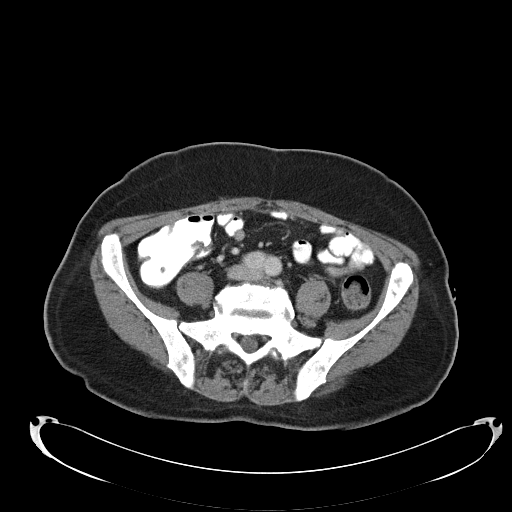
[im 51/97  soft-tissue]
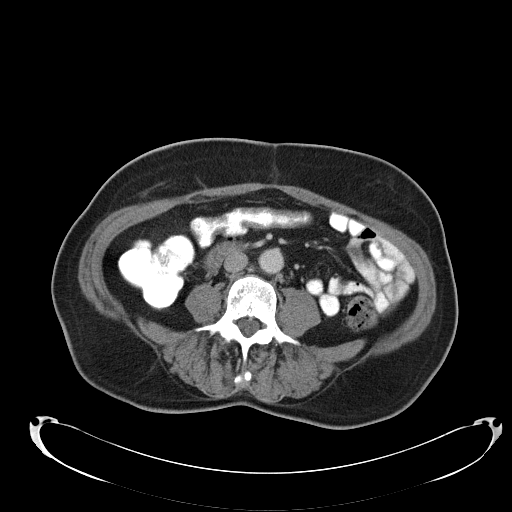
[im 56/97  soft-tissue]
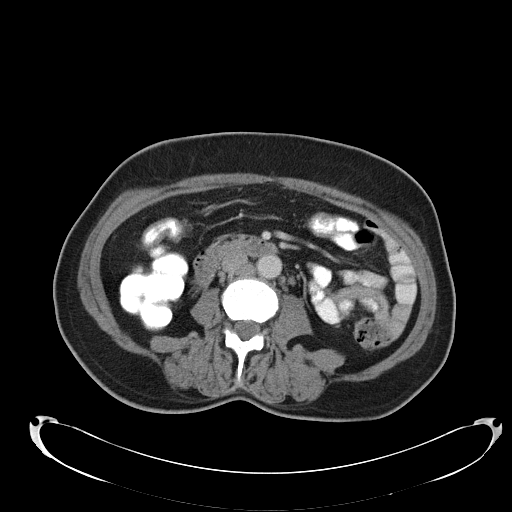
[im 61/97  soft-tissue]
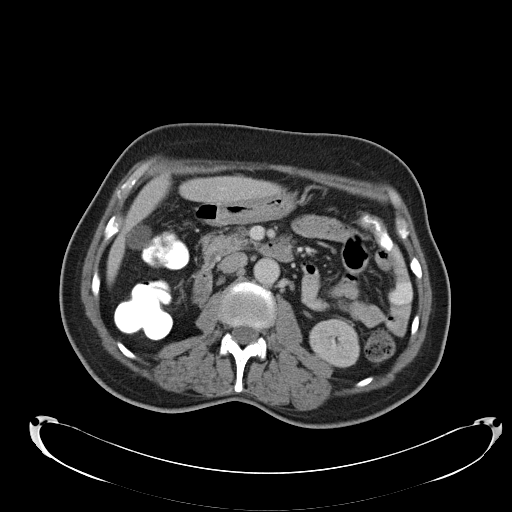
[im 61/97  bone]
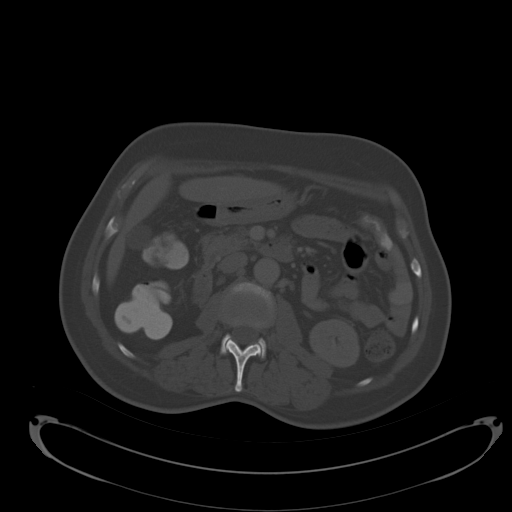
[im 71/97  soft-tissue]
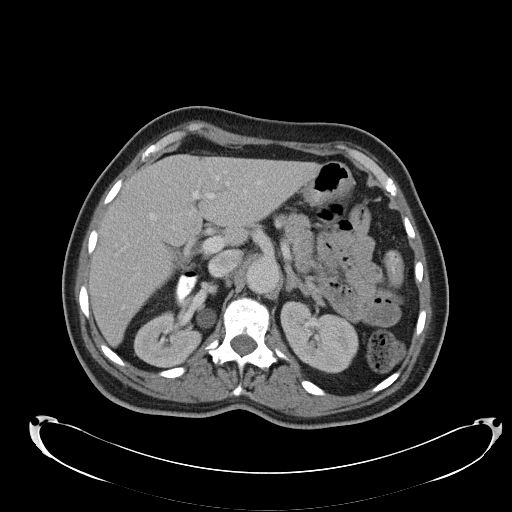
[im 76/97  soft-tissue]
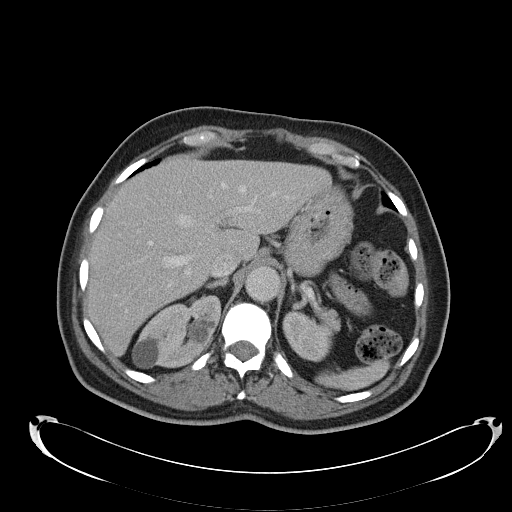
[im 81/97  soft-tissue]
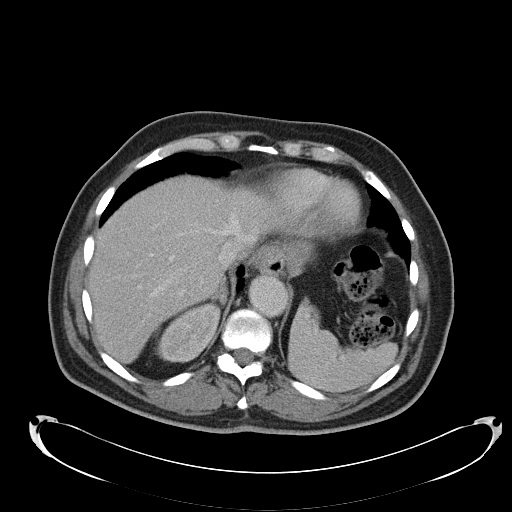
[im 91/97  soft-tissue]
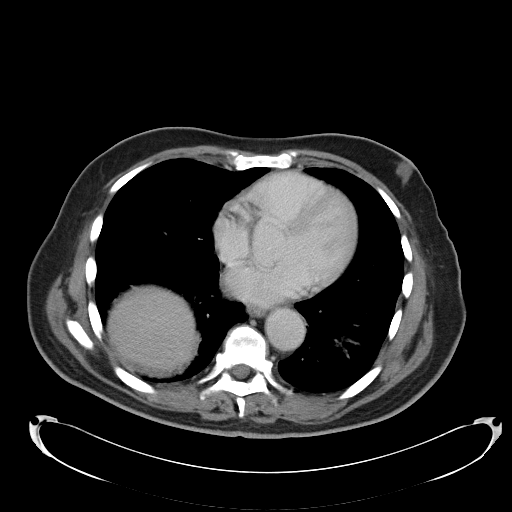

[Series 602: <mpr thick range> · coronal · 0.94mm/px · 3 of 87 slices shown]
[im 29/87  soft-tissue]
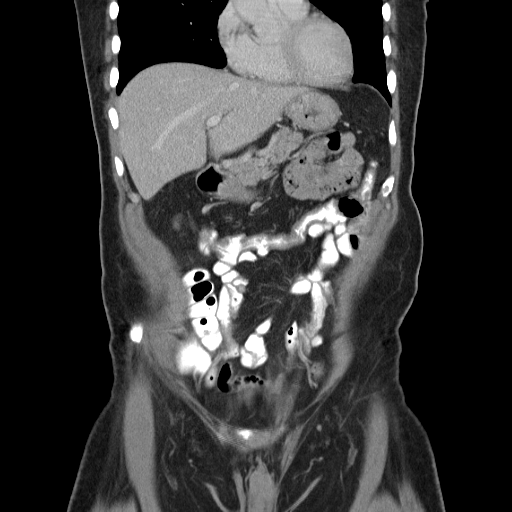
[im 39/87  soft-tissue]
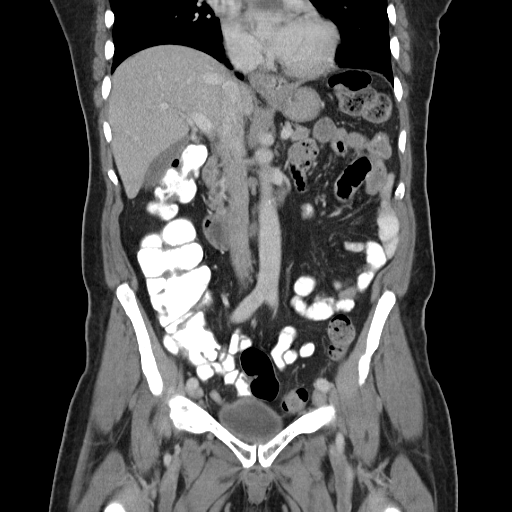
[im 48/87  soft-tissue]
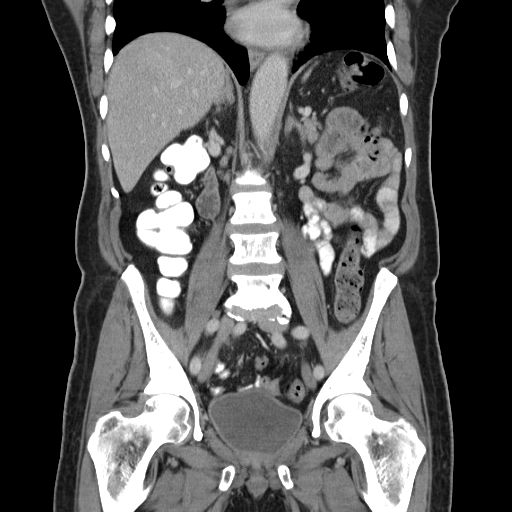

[16 of 46 positions shown; findings below may reference images not displayed]

FINDINGS: Lower chest:  Small hiatal hernia.  Otherwise, unremarkable.

Hepatobiliary: No cystic or solid hepatic lesions. No intra or
extrahepatic biliary ductal dilatation. Gallbladder is normal in
appearance.

Pancreas: No pancreatic mass. No pancreatic ductal dilatation. No
pancreatic or peripancreatic fluid or inflammatory changes.

Spleen: Unremarkable.

Adrenals/Urinary Tract: Bilateral adrenal glands are normal in
appearance. Several well-defined low-attenuation lesions are again
noted in the kidneys bilaterally. Several of these are too small to
characterize, but are favored to represent tiny cysts. The largest
of these are compatible with simple cysts, with the largest
measuring 2.6 cm in diameter in the posterior aspect of the
interpolar region of the right kidney. No hydroureteronephrosis.
Urinary bladder is normal in appearance. Bilateral adrenal glands
are normal in appearance.

Stomach/Bowel: Normal appearance of the stomach. No pathologic
dilatation of small bowel or colon. Normal appendix.

Vascular/Lymphatic: Mild atherosclerosis throughout the abdominal
and pelvic vasculature, without evidence of aneurysm or dissection.
Multiple prominent but non pathologically enlarged retroperitoneal
lymph nodes are noted, but are nonspecific and similar to the prior
examination. No other lymphadenopathy is noted on today's
examination.

Reproductive: Status post radical prostatectomy.

Other: No significant volume of ascites.  No pneumoperitoneum.

Musculoskeletal: These appear more numerous than the prior study,
indicative of progressive metastatic disease. The greatest
involvement noted on today's examination is within the T12 vertebral
body, which is nearly completely sclerotic at this time. There is
also extensive involvement in the right side of the pelvis, most
notably in the region of the right ischium and posterior right
acetabulum, also increased slightly compared to the prior study.
IMPRESSION: 1. Progressive metastatic disease to the bones, as above.
2. No extraskeletal metastatic disease identified on today's
examination.
3. Atherosclerosis.
4. Additional incidental findings, similar prior exams, as above
